# Patient Record
Sex: Female | Born: 1940 | Race: White | Hispanic: No | Marital: Married | Smoking: Never smoker
Health system: Southern US, Community
[De-identification: ages and names within clinical notes are randomized; demographics above are authoritative.]

## PROBLEM LIST (undated history)

## (undated) DIAGNOSIS — I10 Essential (primary) hypertension: Secondary | ICD-10-CM

## (undated) DIAGNOSIS — M1990 Unspecified osteoarthritis, unspecified site: Secondary | ICD-10-CM

## (undated) DIAGNOSIS — K219 Gastro-esophageal reflux disease without esophagitis: Secondary | ICD-10-CM

## (undated) DIAGNOSIS — N3010 Interstitial cystitis (chronic) without hematuria: Secondary | ICD-10-CM

## (undated) DIAGNOSIS — F0390 Unspecified dementia without behavioral disturbance: Secondary | ICD-10-CM

## (undated) DIAGNOSIS — T4145XA Adverse effect of unspecified anesthetic, initial encounter: Secondary | ICD-10-CM

## (undated) DIAGNOSIS — E7800 Pure hypercholesterolemia, unspecified: Secondary | ICD-10-CM

## (undated) DIAGNOSIS — H269 Unspecified cataract: Secondary | ICD-10-CM

## (undated) HISTORY — PX: CERVICAL DISC SURGERY: SHX588

---

## 1998-12-04 ENCOUNTER — Encounter: Payer: Self-pay | Admitting: Neurosurgery

## 1998-12-04 ENCOUNTER — Ambulatory Visit (HOSPITAL_COMMUNITY): Admission: RE | Admit: 1998-12-04 | Discharge: 1998-12-04 | Payer: Self-pay | Admitting: Neurosurgery

## 1998-12-21 ENCOUNTER — Ambulatory Visit (HOSPITAL_COMMUNITY): Admission: RE | Admit: 1998-12-21 | Discharge: 1998-12-21 | Payer: Self-pay | Admitting: Neurosurgery

## 1998-12-21 ENCOUNTER — Encounter: Payer: Self-pay | Admitting: Neurosurgery

## 1999-10-09 ENCOUNTER — Ambulatory Visit (HOSPITAL_COMMUNITY): Admission: RE | Admit: 1999-10-09 | Discharge: 1999-10-09 | Payer: Self-pay | Admitting: Neurosurgery

## 1999-10-09 ENCOUNTER — Encounter: Payer: Self-pay | Admitting: Neurosurgery

## 1999-11-19 ENCOUNTER — Encounter: Payer: Self-pay | Admitting: Neurosurgery

## 1999-11-19 ENCOUNTER — Ambulatory Visit (HOSPITAL_COMMUNITY): Admission: RE | Admit: 1999-11-19 | Discharge: 1999-11-19 | Payer: Self-pay | Admitting: Neurosurgery

## 1999-11-21 ENCOUNTER — Encounter: Payer: Self-pay | Admitting: Neurosurgery

## 1999-11-21 ENCOUNTER — Inpatient Hospital Stay (HOSPITAL_COMMUNITY): Admission: RE | Admit: 1999-11-21 | Discharge: 1999-11-24 | Payer: Self-pay | Admitting: Neurosurgery

## 1999-12-03 ENCOUNTER — Encounter: Admission: RE | Admit: 1999-12-03 | Discharge: 1999-12-03 | Payer: Self-pay | Admitting: Neurosurgery

## 1999-12-03 ENCOUNTER — Encounter: Payer: Self-pay | Admitting: Neurosurgery

## 2000-01-03 ENCOUNTER — Encounter: Payer: Self-pay | Admitting: Neurosurgery

## 2000-01-03 ENCOUNTER — Encounter: Admission: RE | Admit: 2000-01-03 | Discharge: 2000-01-03 | Payer: Self-pay | Admitting: Neurosurgery

## 2000-02-03 ENCOUNTER — Encounter: Admission: RE | Admit: 2000-02-03 | Discharge: 2000-02-03 | Payer: Self-pay | Admitting: Neurosurgery

## 2000-02-03 ENCOUNTER — Encounter: Payer: Self-pay | Admitting: Neurosurgery

## 2000-02-18 ENCOUNTER — Ambulatory Visit (HOSPITAL_COMMUNITY): Admission: RE | Admit: 2000-02-18 | Discharge: 2000-02-18 | Payer: Self-pay | Admitting: Neurosurgery

## 2000-02-18 ENCOUNTER — Encounter: Payer: Self-pay | Admitting: Neurosurgery

## 2000-07-21 ENCOUNTER — Ambulatory Visit (HOSPITAL_COMMUNITY): Admission: RE | Admit: 2000-07-21 | Discharge: 2000-07-21 | Payer: Self-pay | Admitting: Neurosurgery

## 2000-07-21 ENCOUNTER — Encounter: Payer: Self-pay | Admitting: Neurosurgery

## 2002-04-22 ENCOUNTER — Encounter: Payer: Self-pay | Admitting: Neurosurgery

## 2002-04-26 ENCOUNTER — Inpatient Hospital Stay (HOSPITAL_COMMUNITY): Admission: RE | Admit: 2002-04-26 | Discharge: 2002-05-01 | Payer: Self-pay | Admitting: Neurosurgery

## 2002-04-26 ENCOUNTER — Encounter: Payer: Self-pay | Admitting: Neurosurgery

## 2003-07-25 ENCOUNTER — Encounter: Payer: Self-pay | Admitting: Neurosurgery

## 2003-07-25 ENCOUNTER — Ambulatory Visit (HOSPITAL_COMMUNITY): Admission: RE | Admit: 2003-07-25 | Discharge: 2003-07-25 | Payer: Self-pay | Admitting: Neurosurgery

## 2003-08-29 ENCOUNTER — Encounter: Admission: RE | Admit: 2003-08-29 | Discharge: 2003-08-29 | Payer: Self-pay | Admitting: Neurosurgery

## 2003-08-29 ENCOUNTER — Encounter: Payer: Self-pay | Admitting: Neurosurgery

## 2003-09-13 ENCOUNTER — Encounter: Payer: Self-pay | Admitting: Neurosurgery

## 2003-09-13 ENCOUNTER — Encounter: Admission: RE | Admit: 2003-09-13 | Discharge: 2003-09-13 | Payer: Self-pay | Admitting: Neurosurgery

## 2003-11-03 ENCOUNTER — Ambulatory Visit (HOSPITAL_COMMUNITY): Admission: RE | Admit: 2003-11-03 | Discharge: 2003-11-03 | Payer: Self-pay | Admitting: Neurosurgery

## 2003-12-21 ENCOUNTER — Ambulatory Visit (HOSPITAL_BASED_OUTPATIENT_CLINIC_OR_DEPARTMENT_OTHER): Admission: RE | Admit: 2003-12-21 | Discharge: 2003-12-21 | Payer: Self-pay | Admitting: Orthopaedic Surgery

## 2003-12-21 ENCOUNTER — Ambulatory Visit (HOSPITAL_COMMUNITY): Admission: RE | Admit: 2003-12-21 | Discharge: 2003-12-21 | Payer: Self-pay | Admitting: Orthopaedic Surgery

## 2004-04-08 ENCOUNTER — Ambulatory Visit (HOSPITAL_COMMUNITY): Admission: RE | Admit: 2004-04-08 | Discharge: 2004-04-08 | Payer: Self-pay | Admitting: Neurosurgery

## 2004-05-21 ENCOUNTER — Inpatient Hospital Stay (HOSPITAL_COMMUNITY): Admission: RE | Admit: 2004-05-21 | Discharge: 2004-05-25 | Payer: Self-pay | Admitting: Neurosurgery

## 2006-01-03 ENCOUNTER — Ambulatory Visit (HOSPITAL_COMMUNITY): Admission: RE | Admit: 2006-01-03 | Discharge: 2006-01-03 | Payer: Self-pay | Admitting: Neurosurgery

## 2006-03-27 ENCOUNTER — Inpatient Hospital Stay (HOSPITAL_COMMUNITY): Admission: RE | Admit: 2006-03-27 | Discharge: 2006-03-28 | Payer: Self-pay | Admitting: Neurosurgery

## 2007-09-10 ENCOUNTER — Inpatient Hospital Stay (HOSPITAL_COMMUNITY): Admission: RE | Admit: 2007-09-10 | Discharge: 2007-09-13 | Payer: Self-pay | Admitting: Neurosurgery

## 2008-03-06 ENCOUNTER — Encounter: Admission: RE | Admit: 2008-03-06 | Discharge: 2008-03-06 | Payer: Self-pay | Admitting: Neurosurgery

## 2008-05-02 ENCOUNTER — Inpatient Hospital Stay (HOSPITAL_COMMUNITY): Admission: RE | Admit: 2008-05-02 | Discharge: 2008-05-08 | Payer: Self-pay | Admitting: Neurosurgery

## 2009-04-03 ENCOUNTER — Encounter: Admission: RE | Admit: 2009-04-03 | Discharge: 2009-04-03 | Payer: Self-pay | Admitting: Neurosurgery

## 2009-08-24 ENCOUNTER — Encounter: Admission: RE | Admit: 2009-08-24 | Discharge: 2009-08-24 | Payer: Self-pay | Admitting: Neurosurgery

## 2010-09-24 ENCOUNTER — Inpatient Hospital Stay (HOSPITAL_COMMUNITY): Admission: RE | Admit: 2010-09-24 | Discharge: 2010-09-27 | Payer: Self-pay | Admitting: Neurosurgery

## 2010-12-14 ENCOUNTER — Encounter: Payer: Self-pay | Admitting: Neurosurgery

## 2010-12-16 ENCOUNTER — Encounter: Payer: Self-pay | Admitting: Neurosurgery

## 2011-02-05 LAB — CBC
HCT: 40.8 % (ref 36.0–46.0)
Hemoglobin: 13.4 g/dL (ref 12.0–15.0)
MCH: 30.5 pg (ref 26.0–34.0)
MCHC: 32.8 g/dL (ref 30.0–36.0)
MCV: 92.9 fL (ref 78.0–100.0)
Platelets: 151 10*3/uL (ref 150–400)
RBC: 4.39 MIL/uL (ref 3.87–5.11)
RDW: 13.9 % (ref 11.5–15.5)
WBC: 8 10*3/uL (ref 4.0–10.5)

## 2011-02-05 LAB — COMPREHENSIVE METABOLIC PANEL
ALT: 22 U/L (ref 0–35)
AST: 19 U/L (ref 0–37)
Albumin: 3.2 g/dL — ABNORMAL LOW (ref 3.5–5.2)
Alkaline Phosphatase: 73 U/L (ref 39–117)
BUN: 18 mg/dL (ref 6–23)
CO2: 32 mEq/L (ref 19–32)
Calcium: 8.6 mg/dL (ref 8.4–10.5)
Chloride: 104 mEq/L (ref 96–112)
Creatinine, Ser: 0.77 mg/dL (ref 0.4–1.2)
GFR calc Af Amer: >60 mL/min (ref >60)
GFR calc non Af Amer: >60 mL/min (ref >60)
Glucose, Bld: 88 mg/dL (ref 70–99)
Potassium: 3.9 mEq/L (ref 3.5–5.1)
Sodium: 139 mEq/L (ref 135–145)
Total Bilirubin: 0.6 mg/dL (ref 0.3–1.2)
Total Protein: 5.7 g/dL — ABNORMAL LOW (ref 6.0–8.3)

## 2011-02-05 LAB — URINALYSIS, ROUTINE W REFLEX MICROSCOPIC
Ketones, ur: NEGATIVE mg/dL
Nitrite: NEGATIVE
Protein, ur: NEGATIVE mg/dL
Urobilinogen, UA: 0.2 mg/dL (ref 0.0–1.0)

## 2011-02-05 LAB — APTT: aPTT: 28 seconds (ref 24–37)

## 2011-02-05 LAB — DIFFERENTIAL
Basophils Absolute: 0 10*3/uL (ref 0.0–0.1)
Lymphocytes Relative: 31 % (ref 12–46)
Monocytes Absolute: 0.6 10*3/uL (ref 0.1–1.0)
Neutro Abs: 4.8 10*3/uL (ref 1.7–7.7)

## 2011-02-05 LAB — PROTIME-INR
INR: 0.98 (ref 0.00–1.49)
Prothrombin Time: 13.2 seconds (ref 11.6–15.2)

## 2011-04-08 NOTE — H&P (Signed)
NAMEJANILAH, [name]                ACCOUNT NO.:  [id number]   MEDICAL RECORD NO.:  [id number]          PATIENT TYPE:  INP   LOCATION:  3303                         FACILITY:  [facility]   PHYSICIAN:  Payton Doughty, M.D.      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   BODY OF TEXT:  A very nice 70 year old right-handed white girl, who 4  years ago underwent a fusion from L2-S1, did well, has developed  claudicatory complaints in her lower extremities.  Repeat CT shows tight  stenosis at L1-2 and adjacent segment disease from her fusion, and she  is admitted for an extension of her fusion up at T9.   Medical history is fairly benign.  She has had a bladder tack tubal  ligation, anterior and posterior cervical fusions.   Medications are pravastatin, omeprazole, Darvocet, Vicodin, doxycycline,  triamterene, oxybutynin, paroxetine atenolol, aspirin, and Centrum  Silver.   SURGICAL HISTORY:  Tubal ligation, benign ovarian tumors, arthroscopic  surgery in her knee, and cervical and lumbar fusions as noted above.   Family history is noncontributory.   REVIEW OF SYSTEMS:  Marked for back and neck pain, wearing glasses,  difficulty with urination.   ALLERGIES:  PERCOCET.   PHYSICAL EXAMINATION:  HEENT:  Exam is in normal limits.  She has  reasonable range of motion of the neck.  CHEST:  Clear.  CARDIAC:  Regular rate and rhythm.  ABDOMEN:  Nontender with no hepatosplenomegaly.  EXTREMITIES:  Without clubbing or cyanosis.  GU:  Peripheral pulses are good.  NEUROLOGICALLY:  She is awake, alert, and oriented.  Cranial nerves are  intact.  Motor exam shows 5/5 strength throughout the upper and lower  extremities.  She has no reflexes in her lower extremities.  She  complains that she does feel a claudication if she has been up and  about.   A CT scan has been  reviewed above.   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   PLAN:  Lumbar laminectomy at L1-2 and then extensor fusion pass  thoracolumbar junction up to T9.  The risks and benefits have been  discussed with her and she wished to proceed.   .           ______________________________  Payton Doughty, M.D.     MWR/MEDQ  D:  05/02/2008  T:  05/03/2008  Job:  [id number]

## 2011-04-08 NOTE — Op Note (Signed)
NAMEJAZZY, PARMER                ACCOUNT NO.:  [id number]   MEDICAL RECORD NO.:  [id number]          PATIENT TYPE:  INP   LOCATION:  3303                         FACILITY:  [facility]   PHYSICIAN:  Payton Doughty, M.D.      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   PROCEDURE:  T9-L2 segmental pedicle screws, posterolateral arthrodesis  with an L1-L2 laminotomy and foraminotomy.   SURGEON:  Payton Doughty, MD, Neurosurgery.   ANESTHESIA:  General endotracheal.   PREPARATION:  Prepped and draped with alcohol wipe.   COMPLICATIONS:  None.   NURSE ASSISTANT:  [place].   DOCTOR ASSISTANT:  Hewitt Shorts, MD   BODY OF TEXT:  This is a 70 year old lady with transitional segment  disease and claudicatory complaints, taken to operating room, smoothly  anesthetized, intubated, placed prone on the operating table.  Following  shave, prepped and draped in usual sterile fashion.  Skin was  infiltrated with 1% lidocaine with 1:1000 epinephrine and skin was  incised from mid T8 down to the top of L3.  The lamina of T9, T10, T11,  T12, L1, and the transverse processes of L2 with the screws between L2  and L3 as well as the rods were exposed.  The laminotomy and  foraminotomy were carried out bilaterally at L1, down over top of the 2  roots to allow generous decompression of both the central canal and  lateral recesses.  Pedicle screws were then placed in L1, T12, T11, T10,  and T9 on the right and L1, T11, T10, and T9 on the left.  The left T12  pedicle could not be entered because of the small size.  X-ray confirmed  good placement of screws.  They were connected by a rod.  The side  connectors were placed at L2-L3 and this allowed connection of the new  fusion to the old  fusion without removing the old rod.  Rod was  contoured and placed, locking caps applied, and the final tightener  applied.  The transverse processes and lamina of T9, T10, T11, T12, L1,  and L2 were decorticated with a high-speed drill and packed with BMP on  the extender matrix.  Final film showed  good placement of rods and screws.  Successive layers of 0-Vicryl, 2-0  Vicryl and 3-0 nylon were used to close.  Betadine-Telfa dressing was  applied, made occlusive with OpSite.  The patient returned to recovery  room in good condition.           ______________________________  Payton Doughty, M.D.     MWR/MEDQ  D:  05/02/2008  T:  05/03/2008  Job:  [id number]

## 2011-04-08 NOTE — Discharge Summary (Signed)
[name], [name]                ACCOUNT NO.:  [id number]   MEDICAL RECORD NO.:  [id number]          PATIENT TYPE:  INP   LOCATION:  3018                         FACILITY:  [facility]   PHYSICIAN:  Hewitt Shorts, M.D.DATE OF BIRTH:  1940-11-29   DATE OF ADMISSION:  09/10/2007  DATE OF DISCHARGE:  09/13/2007                               DISCHARGE SUMMARY   ADMISSION HISTORY AND PHYSICAL:  The patient is a 70 year old woman, a  patient of Dr. Trey Sailors.  She has had previous posterior cervical fusion  by Dr. Maeola Harman, a previous anterior cervical fusion by Dr. Channing Mutters.  She returns now for further treatment of cervical spondylosis by Dr. Channing Mutters  with extension of her posterior cervical fusion.  Further details of her  admission history and physical examination are included in Dr. Temple Pacini  admission note.   HOSPITAL COURSE:  The patient was admitted by Dr. Channing Mutters.  He performed a  posterior cervical surgery with a right C5-6 posterior cervical  laminotomy and foraminotomy and extension of her fusion from C5 to T1.  Details of the procedures are included his operative note.  Her  postoperative course went well.  She was able to progressively mobilize.  We were able to discontinue her Foley and PCA and she was transferred  from the intensive care unit to the neurosurgical floor.  She has been  voiding.  She has been up and ambulating and at this time, she is being  discharged to home with instructions on wound care and activities.  Her  wound is healing nicely.  The dressing was removed and it was left open  to air.  She is to return in one week for suture removal with Dr. Channing Mutters.  She was given a prescription for Vicodin 1 or 2 q.4-6 h. p.r.n. pain, 60  tablets, no refills.   DISCHARGE DIAGNOSIS:  Cervical spondylosis.      Hewitt Shorts, M.D.  Electronically Signed     RWN/MEDQ  D:  09/13/2007  T:  09/13/2007  Job:  [id number]

## 2011-04-08 NOTE — Discharge Summary (Signed)
NAMEJESSALYN, [name]                ACCOUNT NO.:  [id number]   MEDICAL RECORD NO.:  [id number]          PATIENT TYPE:  INP   LOCATION:  3004                         FACILITY:  [facility]   PHYSICIAN:  Payton Doughty, M.D.      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   PROCEDURES:  T9 to L2 segmental fusion.   COMPLICATIONS:  None.   DISCHARGE STATUS:  Alive and well.   BODY:  This is a 70 year old right-handed white lady whose history and  physical is recounted in the chart.  She had a fusion from L2-S1 and is  doing well.  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  She was  admitted after ascertaining normal laboratory values and underwent  fusion from T9 to L2.  Postoperatively, she has done well and spent a  couple of days in the ICU somewhat somnolent related to pain medication.  As this is cleared, she has been moved to the floor.  She is  participating in physical therapy.  She is up and about with her Foley  out, eating and voiding normally.  Her vital signs are stable, her  incisions are clean and dry and she is being discharged home in the care  of her family.  Her followup will be in the [place] offices in about a  week for sutures.           ______________________________  Payton Doughty, M.D.     MWR/MEDQ  D:  05/08/2008  T:  05/08/2008  Job:  [id number]

## 2011-04-08 NOTE — H&P (Signed)
NAMEJAYCELYNN, [name]                ACCOUNT NO.:  [id number]   MEDICAL RECORD NO.:  [id number]          PATIENT TYPE:  INP   LOCATION:  3018                         FACILITY:  [facility]   PHYSICIAN:  Payton Doughty, M.D.      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   This is a very nice now 70 year old, right-handed white lady who has had  a posterior decompression and fusion at C6-7, C7-T1 by Dr. Venetia Maxon  numerous years ago.  The she had an anterior decompression and fusion by  me in February 2007.  She had done well for awhile and has had  increasing pain out into her left arm.  She is now admitted for  posterior augmentation and fusion at C5, taking down the 6-7 and 7-1  fusion and replacing it.   PAST MEDICAL HISTORY:  Fairly benign.   ALLERGIES:  She has no allergies.   SOCIAL HISTORY:  She does not smoke or drink.   PAST SURGICAL HISTORY:  Lumbar spine operation.   PHYSICAL EXAMINATION:  HEENT:  Within normal limits.  NECK:  She has somewhat limited range of motion in her neck because of  neck pain.  CHEST:  Clear.  CARDIAC:  Regular rate and rhythm.  NEUROLOGIC:  She is awake, alert and oriented.  Cranial nerves are  intact. Motor exam shows 5/5 strength throughout the upper and lower  extremities.  There is no current sensory dysesthesias described in her  right C6 distribution.  Reflexes are flicker at the left biceps, 1 at  the right, 2 at the triceps bilaterally, 1 at the brachioradialis  bilaterally.  Hoffman's is negative.   MR results have been reviewed above.   CLINICAL IMPRESSION:  Right C6 radiculopathy with foraminal narrowing.   PLAN:  Revision and effusion posteriorly at 6-7 and 7-1 and extension up  to C5as well as a left C6 laminotomy.  The risks and benefits have been  discussed with her.  She wishes to proceed.    .     ______________________________  Payton Doughty, M.D.    MWR/MEDQ  D:  09/10/2007  T:  09/12/2007  Job:  [id number]

## 2011-04-08 NOTE — Op Note (Signed)
[name], [name]                ACCOUNT NO.:  [id number]   MEDICAL RECORD NO.:  [id number]          PATIENT TYPE:  INP   LOCATION:  2899                         FACILITY:  [facility]   PHYSICIAN:  Payton Doughty, M.D.      DATE OF BIRTH:  1941/09/15   DATE OF PROCEDURE:  09/10/2007  DATE OF DISCHARGE:                               OPERATIVE REPORT   PREOPERATIVE DIAGNOSIS:  Spondylosis below __________ including C3 and  C4.   POSTOPERATIVE DIAGNOSIS:  Spondylosis below __________ including C3 and  C4.   OPERATIVE PROCEDURE:  Left C5-6 laminotomy, foraminotomy and C5 to T1  posterior cervical fusion.   SERVICE:  Neurosurgery.   SURGEON:  Payton Doughty, M.D.   ANESTHESIA:  General endotracheal.   PREPARATION:  Prepped with alcohol wipe.   COMPLICATIONS:  None.   ASSISTANT:  Hewitt Shorts, M.D.   This is a 70 year old lady with a left C6 radiculopathy.  She had had a  prior posterior fusion from C6 to T1 and has a __________ anomaly above  that and a prior anterior fusion at C5-6. She was taken to the operating  room, smoothly anesthetized, intubated, placed prone in the Stryker bed.  Following shave, prep and drape in the usual sterile fashion, the old  skin incision was reopened and the C5 lamina was dissected free.  The  old hardware was exposed, it had screws at C6, C7 and T1 and the level  just above that was also exposed.  There was gross movement between the  two.  The top most screw on the left side which was in C6 was loose and  had migrated superiorly through the lateral mass and was within the  neural foramen.  The drill and Kerrison were used to follow this track  down and got right down to the nerve root.  The nerve root was therefore  decompressed with a foraminotomy.  The screw was placed above it and the  old screws were replaced on the right side and on the left side new  screws were placed in C6 in a different trajectory so as not to exit the  lateral mass.  The C5-T1 rods were then placed. The remaining lamina was  decorticated with a high-speed drill and packed with BMP on the putty  extender matrix.  Locking screws were tightened on the rods.  Intraoperative x-ray showed good placement of the screws in the top into  the construct.  Successive layers of #0 Vicryl, 2-0 Vicryl, and 3-0  nylon were used to close.  A Betadine Telfa dressing was applied and the  patient placed in an Aspen collar and returned to the recovery room in  good condition.           ______________________________  Payton Doughty, M.D.    MWR/MEDQ  D:  09/10/2007  T:  09/13/2007  Job:  [id number]

## 2011-04-11 NOTE — Op Note (Signed)
[place]. [facility]  Patient:    [name] Visit Number: [id number] MRN: [id number]          Service Type: SUR Location: 3000 [id number] [id number] Attending Physician:  Josie Saunders Proc. 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  OPERATION PERFORMED: 1. Axis lateral mass plating C6 through T1 with T1 pedicle screws with morselized    local bone autograft and laminectomy C6 through T1 levels with C6-7 foraminotomy    on the right utilizing Stealth neuronavigation system.  SURGEON:  Danae Orleans. Venetia Maxon, M.D.  ASSISTANT:  Tanya Nones. Jeral Fruit, M.D.  ANESTHESIA:  General endotracheal.  ESTIMATED BLOOD LOSS:  100 cc.  COMPLICATIONS:  None.  DISPOSITION:  Recovery.  INDICATIONS FOR PROCEDURE:  [name] is a 70 year old woman with severe cervical stenosis and degenerative disk disease with spondylolisthesis with C7 n T1 with cervical myelopathy and C7 radiculopathy on the right.  It was elected o take her to surgery for decompression and posterior fixation.  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  DESCRIPTION OF PROCEDURE:  The patient was brought to the operating room. Following the satisfactory and uncomplicated induction of general endotracheal anesthesia and placement of intravenous lines, the patient was placed in three-pin head fixation using the Mayfield head holder.  She was placed in the prone position turning her in the collar.  Her  shoulders were taped down.  Her occiput was shaved and her posterior neck was then prepped and draped in the usual sterile fashion. The area of planned incision was infiltrated with 0.25% Marcaine and 0.5% lidocaine 1:200,000 epinephrine.  Incision was made from what was felt to be the C5 through T1 levels.  This was carried through subcutaneous tissue in the avascular midline plane exposing lateral masses of C6 through T1.  The bone was cleared of soft tissue to facilitate utilization of Stealth system.  The case had already been loaded in the Stealth computer and the spine system was utilized.  The small clamp was placed on the spinous process of T1 and after inputting data appropriately, and utilizing surface merge protocol, the Stealth probe was then used to confirm entry point and trajectory of planned pedicle screws at the T1 level.  This was done using axis plates and standard landmarks of 3 mm lateral to the midpoint of the  articular facet at the level of the base of the transverse process angling the screw, angling the drill 10 degrees medial and 10 degrees caudal.  This was felt to reasonably accurate placement into the pedicles of T1 bilaterally and this was further refined using the Stealth system.  The axis plate 2.5 mm drill was then  used to drill pedicles of T1 bilaterally to a depth of 20 mm.  The outer cortex was tapped.  The C6 lateral masses were then drilled to a depth of 14 mm outer cortex was tapped.  13 mm plates were then cut to 3-hole configuration and lordosed appropriately and  following drilling down the facets at C7-T1 and at C6-7, the morselized autograft was then inserted into the facet joints bilaterally and tamped into position appropriately.  The lateral mass plates were then affixed to the posterior cervical spine with two 14 mm x 3.5 mm screws at C6 and two 18 mm x 3.5 mm screws at T1.  All screws had excellent purchase.  The right C7 lateral  mass screw was placed but due to the somewhat anomalous lateral mass at C7 on the left, it was elected not to place the lateral mass screw at that level.  Subsequently, a laminectomy was performed of C7 and the majority of T1 and the majority of C6 with decompression of the cervical spinal cord.  The C7 nerve root was widely decompressed on the right with a generous foraminotomy.  This was done using loupe magnification with Leksell rongeur and 2 mm gold tipped Kerrison rongeur.  The dura was felt to be well decompressed at the C7 level.  C7-T1 level there was significant cord compression which was relieved with laminectomy. Hemostasis was obtained with Gelfoam soaked in thrombin.  The wound was then copiously irrigated with bacitracin saline.  The lateral masses of C6 through T1 were decorticated bilaterally using Midas Rex drill with A-2 bur and remaining morselized bone autograft was then positioned over the lateral masses. Self-retaining retractors were removed.  The posterior cervical musculature was  reapproximated with 0 Vicryl sutures and the posterior cervical fascia was reapproximated with 0 Vicryl sutures.  The subcuticular layer was reapproximated with interrupted 2-0 Vicryl suture subcuticular stitch and the skin edges were reapproximated with 3-0 running simple nylon stitch.  The wound was dressed with bacitracin, Telfa, gauze and tape.  The patient was placed in an Aspen collar and taken out of pins, returned to supine position and extubated in the operating room and taken to recovery room in stable and satisfactory condition having tolerated the procedure well.  Prior to instrumenting the spine, an intraoperative x-ray as obtained which demonstrated a towel clip at the C5 level which was felt to correspond to the levels determined in surgery.  The final x-ray after positioning of hardware did not reveal any of the anatomic details, given the position  of the patients shoulders relative to the fixation of hardware. Attending Physician:  Josie Saunders DD:  11/21/99 TD:  11/22/99 Job: [id number]

## 2011-04-11 NOTE — H&P (Signed)
NAMEHARLEYQUINN, [name]                ACCOUNT NO.:  [id number]   MEDICAL RECORD NO.:  [id number]          PATIENT TYPE:  INP   LOCATION:  2899                         FACILITY:  [facility]   PHYSICIAN:  Payton Doughty, M.D.      DATE OF BIRTH:  1941-03-22   DATE OF ADMISSION:  03/27/2006  DATE OF DISCHARGE:                                HISTORY & PHYSICAL   ADMISSION DIAGNOSIS:  Spondylosis at C5-C6.   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 Venetia Maxon at C6-C7 posteriorly.  She has developed increasing neck and arm  pain and plain films demonstrate significant spondylosis at C5-C6 and she is  admitted now for an anterior decompression and fusion and C5-C6.   PAST MEDICAL HISTORY:  1.  Hypertension.  2.  Depression.   MEDICATIONS:  Toprol, Paroxetine, hydrochlorothiazide, triamterene,  oxybutynin.   ALLERGIES:  PERCOCET.   PAST SURGICAL HISTORY:  Several lumbar spine surgeries as well as those  outlined above in her neck.   SOCIAL HISTORY:  She does not smoke or drink and is retired.   FAMILY HISTORY:  Both parents are deceased.  History is not given.   REVIEW OF SYSTEMS:  Remarkable for arm and neck pain.   PHYSICAL EXAMINATION:  HEENT:  Within normal limits.  She has reasonable  range of motion in her neck, but it is painful.  CHEST:  Clear.  CARDIAC:  Regular rate and rhythm.  ABDOMEN:  Nontender with no hepatosplenomegaly.  EXTREMITIES:  Without clubbing, cyanosis or edema.  GENITALIA:  Deferred.  VASCULAR:  Peripheral pulses are good.  NEUROLOGIC:  She is awake, alert and oriented.  Cranial nerves 2-12 grossly  intact.  Motor exam shows 5/5 strength throughout the upper extremities  except for the biceps on the right that is 5-/5.  She has a right C6 sensory  deficit on the right and none on the left.  Reflexes are absent to biceps on  the right, 1 on the left, 1 at  triceps bilaterally, 1 at brachioradialis  bilaterally.  Hoffman's is negative.  Lower extremities are nonmyelopathic.   LABORATORY DATA AND X-RAY FINDINGS:  MR results have been reviewed above.   IMPRESSION:  Cervical spondylosis.   PLAN:  Anterior decompression and fusion at C5-C6.  The risks and benefits  of this approach have been discussed with her and she wishes to proceed.           ______________________________  Payton Doughty, M.D.     MWR/MEDQ  D:  03/27/2006  T:  03/27/2006  Job:  [id number]

## 2011-04-11 NOTE — Discharge Summary (Signed)
[place]. [facility]  Patient:    [name], [name] Visit Number: [id number] MRN: [id number]          Service Type: SUR Location: 3000 [id number] Attending Physician:  Emeterio Reeve Dictated by:   Payton Doughty, M.D. Admit Date:  04/26/2002 Discharge Date: 05/01/2002                             Discharge Summary  ADMITTING DIAGNOSIS:  Spondylosis L4-5, L5-S1.  DISCHARGE DIAGNOSIS:  Spondylosis L4-5, L5-S1.  PROCEDURES:  L4-5, L5-S1 laminectomy and diskectomy, posterior lumbar interbody fusion with a Ray Threaded Fusion Cage, posterolateral arthrodesis.  COMPLICATIONS:  None.  DISCHARGE STATUS:  Alive and well.  HISTORY OF PRESENT ILLNESS:  This is a 70 year old right-handed white lady whose history and physical is recounted on the chart.  She has a symptomatic spondylosis and spondylolisthesis at L4-5 with 6 mm of subluxation.  She was admitted for fusion.  PAST MEDICAL HISTORY:  Remarkable for hypertension and reflux.  MEDICATIONS:  Nexium, Zoloft, [name], Dyazide, and Voltaren.  ALLERGIES:  PERCOCET.  PHYSICAL EXAMINATION:  GENERAL:  General exam is unremarkable.  NEUROLOGIC:  Intact with nerve claudicatory complaints.  HOSPITAL COURSE:  She was admitted after ascertaining normal laboratory values and underwent an L4-5, L5-S1 laminectomy, diskectomy, and posterior lumbar interbody fusion.  Postoperatively she has done well.  She was on the floor, used the PCA for two days.  That was stopped, and she has mobilized in physical therapy since then.  She is currently competent with her activities of daily living, eating and voiding normally.  She is using Darvocet for pain. She is being discharged home in the care of her family [facility]. Dictated by:   Payton Doughty, M.D. Attending Physician:  Emeterio Reeve DD:  05/01/02 TD:  05/03/02 Job:  [id number]

## 2011-04-11 NOTE — Op Note (Signed)
NAME:  [name], [name]                          ACCOUNT NO.:  [id number]   MEDICAL RECORD NO.:  [id number]                   PATIENT TYPE:  AMB   LOCATION:  DSC                                  FACILITY:  [facility]   PHYSICIAN:  Claude Manges. Cleophas Dunker, M.D.            DATE OF BIRTH:  11/29/1940   DATE OF PROCEDURE:  12/21/2003  DATE OF DISCHARGE:                                 OPERATIVE REPORT   PREOPERATIVE DIAGNOSIS:  1. Rotator cuff tear with impingement, right shoulder.  2. Degenerative joint disease, acromioclavicular joint.   POSTOPERATIVE DIAGNOSIS:  1. Rotator cuff tear with impingement, right shoulder.  2. Degenerative joint disease, acromioclavicular joint.   PROCEDURE:  1. Arthroscopic subacromial decompression.  2. Arthroscopic distal clavicle resection.  3. Mini-open repair of rotator cuff tear.   SURGEON:  Claude Manges. Cleophas Dunker, M.D.   ANESTHESIA:  General orotracheal with supplemental interscalene block.   COMPLICATIONS:  None.   HISTORY:  70 year old female who has been experiencing trouble with her  right shoulder for several months.  She had a recent MRI scan performed in  December that revealed a full thickness tear of the supraspinatus tendon  with some retraction.  There was a partial tear of the infraspinatus tendon  and fairly significant tendinosis and tendonopathy of both the tendons.  There were some degenerative changes of the [facility] joint with narrowing and  marked encroachment on the supraspinatus from a lateral down sloping  acromion.  The patient has been uncomfortable and with the above diagnosis,  wishes to proceed with repair.   PROCEDURE:  With the patient comfortable on the operating table and under  general orotracheal anesthesia and with a supplemental interscalene nerve  block, the patient was placed in a semisitting position with a shoulder  frame.  The right shoulder was then prepped with DuraPrep from the base of  the neck circumferentially below  the elbow.  Sterile draping was performed.  A marking pen was used to outline the acromion, the [facility] joint, and the  coracoid.  At a point a fingerbreadth inferior and medial to the posterior  angle of the acromion, a small stab wound was made prior to which 0.25%  Marcaine with epinephrine was injected.  The arthroscope was easily placed  into the shoulder joint.  There was considerable partial tearing of the  rotator cuff at the humeral head.  The biceps tendon was intact.  I did not  see any significant chondromalacia of the humeral head or the acromion.  The  glenoid labrum was intact.  There were no loose bodies.  A second portal was  established anteriorly and I did shave the partial tearing.   The arthroscope was then placed in the subacromial space posteriorly and the  cannula placed in the subacromial space anteriorly.  A third portal was  established in the lateral subacromial space.  An arthroscopic subacromial  decompression was then performed.  There was considerable overhang of the  anterior and the lateral acromion.  The 6 mm bur was used to remove the  overhang.  I thought we had a very nice decompression.  The [facility] joint was  easily visualized and distal resection of about a cm was performed using the  6 mm bur through both the anterior and the lateral portals.  I had a very  nice distal clavicle resection.   A mini-open repair of the rotator cuff was then performed through about a 1  1/2 inch incision located at the junction of the anterior and lateral  aspects of the shoulder.  By sharp dissection, the incision was carried down  to the subcutaneous tissue.  An incision was made through the deltoid fascia  and the fibers were bluntly resected.  A retractor was inserted.  There was  still considerable bursal tissue in the recess anteriorly which was  resected.  There was an obvious rotator cuff tear of the supraspinatus with  some retraction in a V fashion.  I sharply  debrided the edges, had good  bleeding bone on the humeral head, inserted a single Mitek anchor and  repaired the cuff tear.  I then supplemented with one Ethibond suture to  bone.  As I placed the arm through a range of motion, there was no further  impingement.  I did a finger palpation of the subacromial space and felt I  had a very nice decompression.   The wound was irrigated with saline solution.  The deltoid fascia was closed  with a running 0 Vicryl, the subcu with 2-0 Vicryl, and the skin with  running 3-0 subcuticular Prolene with Steri-Strips over Benzoin.  0.25%  Marcaine with epinephrine was injected into the wound edges.  A sterile,  bulky dressing was applied followed by a sling.   PLAN:  Recovery care, discharge in the a.m., Jacky Kindle, office one  week.                                               Claude Manges. Cleophas Dunker, M.D.    PWW/MEDQ  D:  12/21/2003  T:  12/21/2003  Job:  [id number]

## 2011-04-11 NOTE — H&P (Signed)
NAME:  [name], [name]                          ACCOUNT NO.:  [id number]   MEDICAL RECORD NO.:  [id number]                   PATIENT TYPE:  INP   LOCATION:  NA                                   FACILITY:  [facility]   PHYSICIAN:  Payton Doughty, M.D.                   DATE OF BIRTH:  02-Jun-1941   DATE OF ADMISSION:  05/21/2004  DATE OF DISCHARGE:                                HISTORY & PHYSICAL   ADMITTING DIAGNOSIS:  Spondylosis L2-3, L3-4.   __________  This is a 70 year old right handed white lady who in June 2003 underwent a  decompression and fusion at L4-5 and L5-S1.  She did extremely well for  several years and then has had the increase in pain in her back and across  the knee and down the lateral aspect of the calf on the left.  She had a  foraminal disc at 3-4, which had resolved, but she has persistent pain.  Reimaging has revealed a synovial cyst at 3-4, a foraminal disc at 2-3 and  she is now admitted for a decompression and fusion of those two levels with  extension of her posterior hardware.   PAST MEDICAL HISTORY:  1. Hypertension.  2. Reflux.   MEDICATIONS:  She is on Nexium, [name], Diazide and Voltaren.   ALLERGIES:  She is intolerant to PERCOCET.   PAST SURGICAL HISTORY:  1. Tubal ligation and bladder tack in 1980.  2. Hysterectomy in 1986.  3. Bladder tack in 1987.  4. Ovarian tumor in 1990.  5. Arthroscopy of the knee in 1998.   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   REVIEW OF SYSTEMS:  Remarkable for back and leg pain.   HEENT:  Within normal limits.  NECK:  She has reasonable range of motion and a well healed scar.  CHEST:  Clear.  CARDIAC:  Regular rate and rhythm.  ABDOMEN:  Nontender with no hepatosplenomegaly.  EXTREMITIES:  Without clubbing or cyanosis.  Peripheral pulses are good.  GU:  Exam deferred.  NEUROLOGIC:  She is awake, alert and oriented.  Cranial nerves are intact.  Motor exam shows 5/5 strength throughout  the upper and lower extremities.  She has mild weakness in the extensors on the left side and may be pain  related.  She has a left L4 sensory deficit.  Reflexes are 2 at the knees,  absent at the ankles.  Straight leg raises bilaterally positive.   Radiographic results have been reviewed above.   CLINICAL IMPRESSION:  Lumbar spondylosis with progression and instability at  L2-3 and L3-4.   PLAN:  Lumbar laminectomy, discectomy, posterior lumbar __________ with  right sided fusion cages at L4-5 and L5-S1 and pedicle screws at L2 and L3  to be extended down to include the L3-4 and L4-5 fusion.  The risks and  benefits of this  approach were discussed with her and she wished to proceed.                                                Payton Doughty, M.D.    MWR/MEDQ  D:  05/21/2004  T:  05/21/2004  Job:  [id number]

## 2011-04-11 NOTE — H&P (Signed)
[place]. [facility]  Patient:    [name], [name] Visit Number: [id number] MRN: [id number]          Service Type: SUR Location: [id number] Attending Physician:  Emeterio Reeve Dictated by:   Payton Doughty, M.D. Admit Date:  04/26/2002                           History and Physical  ADMITTING DIAGNOSIS:  Spondylosis at L4-5, L5-S1.  SERVICE:  Neurosurgery.  HISTORY OF PRESENT ILLNESS:  This is a 70 year old right-handed white lady, who was a patient of Dr. Jomarie Longs D. Sterns, had an anterior cervical done in 2000 and did reasonably well.  She came to the office in March with increasing pain in her back and down her right leg, some pain in her arm but cramping in her calf and difficulty with her foot.  She had spondylolisthesis at 4-5, was restudied with flexion and extension views and found to move about 6 mm.  Her MRI showed significant stenosis and lateral recess narrowing at 4-5.  She is now admitted for a decompression and fusion and stabilization at 4-5/5-1.  PAST MEDICAL HISTORY:  Her medical history is remarkable for a bit of hypertension as well as reflux.  MEDICATIONS:  She is on Nexium, Zoloft, [name], Dyazide and Voltaren daily.  ALLERGIES:  She is intolerant to PERCOCET.  PAST SURGICAL HISTORY:  Tubal ligation, bladder tacking in 1980, hysterectomy in 1986, bladder tacking again in 1987, ovarian tumors in 1990, arthroscopy of knee in 1998.  SOCIAL HISTORY:  She does not smoke and does not drink.  She is a housewife.  REVIEW OF SYSTEMS:  Her review of systems is remarkable for back and leg pain.  PHYSICAL EXAMINATION:  HEENT:  Within normal limits.  NECK:  She has reasonable range of motion in her neck with a well-healed scar.  CHEST:  Clear.  CARDIAC:  Regular rate and rhythm.  ABDOMEN:  Abdomen is nontender with no hepatosplenomegaly.  EXTREMITIES:  Without clubbing or cyanosis.  GU:  Deferred.  PERIPHERAL PULSES:   Good.  NEUROLOGIC:  She is awake, alert and oriented.  Her cranial nerves are intact. Motor exam shows 5/5 strength throughout the upper and lower extremities. When she stands, she develops weakness in the dorsiflexors on the right side. Reflexes are 2 at the knees, absent at the ankles.  Straight leg raise is bilaterally positive.  LABORATORY AND ACCESSORY DATA:  MRI results have been reviewed above.  CLINICAL IMPRESSION:  Significant lumbar spondylosis with neurogenic claudication.  PLAN:  The plan is for a lumbar laminectomy, diskectomy and posterior lumbar interbody fusion at 4-5 and 5-1 with pedical screw stabilization.  The risks and benefits of this approach have been discussed with her and she wishes to proceed. Dictated by:   Payton Doughty, M.D. Attending Physician:  Emeterio Reeve DD:  04/26/02 TD:  04/27/02 Job: [id number]

## 2011-04-11 NOTE — H&P (Signed)
[place]. [facility]  Patient:    [name]                          MRN: [id number] Adm. 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  I initially saw her November 26, 1998.  At that time, she said it had not interfered with her work though was beginning to interfere with her ability to work.  She complained of pain in the back of her neck and into her right arm. he said this has developed over the last two years but has steadily increased over the past year.  She notes numbness and pain with the sense of electricity and as if  needles are sticking into her and notes pain and sharpness in all of her fingers on the right hand.  She describes it as a nagging pain rather than a sharp pain. he says she had left arm symptoms in 1987, was diagnosed with a left carpal tunnel  syndrome by was treated with medications and did not have surgery and had resolution of her symptoms.  She does note that her right arm is weak.  She has  noted that she cannot draw the number 5 because of lack of control of her hand.  She denies any lower extremity or any bowel or bladder dysfunction.  She has initially not had any x-rays obtained.  PAST MEDICAL HISTORY:  Significant for tubal ligation in 1975, bladder tack in 1980, hysterectomy in 1986, bladder tack again in 1987, benign ovarian tumors removed in 1990, and arthroscopic surgery of her right knee in October 1998. She has additional medical problems with high blood pressure and a hiatal hernia with acid reflux.  ALLERGIES:  No known  drug allergies other than an intolerance to PERCOCET.  SOCIAL HISTORY:  She is a nonsmoker, nondrinker.  She has noted weight gain from overeating and states that she is 5 feet 4 inches tall but would not state her weight.  CURRENT MEDICATIONS: 1. Triamterene/hydrochlorothiazide 37.5/25 one every morning. 2. Estradiol 1 mg every day. 3. Voltaren XR 100 mg daily. 4. Prilosec 20 mg daily. 5. Zoloft 50 mg daily. 6. [name] 20 mEq daily.  REVIEW OF SYSTEMS:  Detailed Review of Systems sheet was reviewed with the patient. She is negative constitutional, gastrointestinal, genitourinary, integumentary,  neurologic, endocrine, hematologic, and allergic.  Review of Systems under eyes, she notes that she wears glasses and has cataracts.  Ear, Nose, and Throat: She has hearing aid, hearing loss, sinus problems, and sinus headache.  Cardiovascular: She notes high blood pressure and high cholesterol.  Respiratory: Chronic cough. Musculoskeletal: She notes arm weakness, back pain, arm pain, arthritis, and neck pain.  Psychiatric: She notes trouble with anxiety and depression.  DIAGNOSTIC STUDIES:  Initially she had no diagnostic studies of her cervical spine, but there were subsequently obtained.  PHYSICAL EXAMINATION:  GENERAL:  A very pleasant, cooperative, moderately obese white female in no acute distress.  MUSCULOSKELETAL:  She had right paraspinous  spasms in the paracervical region, negative on the left.  She has reproducible right arm pain, radicular symptoms turning her head to the right and extending her neck.  She has negative Lhermittes sign with axial compression, negative Spurlings maneuver on the left.  She has negative shoulder impingement testing.  NEUROLOGIC:  She is awake, alert, and fully oriented.  Pupils are equal, round nd reactive to light.  Extraocular movements are intact.  Funduscopic examination reveals sharp disks OU.  She wears a hearing aid in the left  ear and is hard of  hearing in both ears.  She said her right ear has not been as bad as her left but is now getting worse.  Facial sensation and facial motor intact and symmetric.  Shoulder shrug is symmetric.  Palate is upgoing.  Tongue is midline and mobile.  Upper extremity strength is full in all motor groups bilaterally symmetric with the exception of the right biceps strength which is 4/5.  Lower extremity strength s full in all motor groups bilaterally symmetric.  Reflexes are trace in the right biceps, 2 in the left biceps, 2 in the triceps bilaterally, 2 at the brachial radialis.  Negative Hoffmanns sign.  Knee jerks are 2, ankle jerks are 2. Great toes are downgoing to plantar stimulation.  She notes numbness in the right hand in the first through fourth digits to pinprick.  There is not any evidence of numbness in the left upper extremity or in her lower extremities.  HEENT:  No carotid bruits, no masses.  CHEST:  Clear to auscultation.  HEART:  Regular rate and rhythm without murmur.  ABDOMEN:  Soft, nontender.  Active bowel sounds.  No hepatosplenomegaly appreciated.  EXTREMITIES:  No edema, clubbing, or cyanosis in the lower extremities.  Intact  pedal pulses.  INITIAL IMPRESSION:  [name] is a 70 year old woman with reproducible right upper extremity pain with a history of cervical spondylosis and some right biceps weakness.  The patient then had an MRI and cervical spine x-rays obtained.  She  returned to see me on the 19th of January.  Her MRI and plain x-rays were reviewed with her.  These show subluxation of C7 on T1 which appears to be stable on flexion, and extension is approximately 5 mm.  There is some posterior buckling of the ligament at C7-T1 with narrowing of the subarachnoid space but without frank cord compression.  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 She has a left C5-6 osteophyte formation and right C6-7 osteophyte formation. he did not have any spinal cord compression on her initial studies.  Her strength s  full on confrontational testing, but she continued to complain of significant right arm pain.  I recommended that, since she was not doing too badly at that point,  that she continue with conservative management and not undergo any surgical intervention given the extent of her disease.  She return to see me on 31 May, nd repeat x-rays showed no progression of subluxation at C7-T1.  She said at that point that she was doing better, and the plan was for her to return in a year with repeat cervical spine flexion/extension views; however, on November 6, she returned.  She said she was having a lot more difficulty with right arm pain and numbness along with right leg pain and numbness and stiffness in both her legs.  She also said that  her neck was bothering her a lot more.  She has been working but says it has been hard for her to do so.  On examination at that point, she had mild intrinsic weakness on the right, negative on the left.  She appeared to have full extremity strength in all motor groups bilaterally symmetric. She lso noted some low back pain to palpation at the lumbosacral junction.  Her lower extremity strength was full in motor groups bilaterally symmetric.  Reflexes were somewhat more brisk in the upper extremities and clearly more brisk than they were in the lower extremities, 3 at the knees, and 3 at the ankles.  Great toes were  downgoing to plantar stimulation.  She did appear to have increased tone in her  lower extremities compared to her last visit, and she feels she has more stiffness. She has not noted a lot of change in her bowel or bladder control.  She notes Lhermittes sign with axilla compression.  She has some numbness in her right greater than left upper  extremities and lower extremities to pin.  She has negative straight leg raise.  She did not have any sciatic notch discomfort to palpation.  I was concerned that, given Ms. Millers history of subluxation of C7 on T1 and significant worsening of her pain complaints along with her subjective stiffness and objective findings of hyperreflexia, that she may have increased cord compression.  I then sent her for repeat MRI and cervical spine x-rays.  She returned to see me on November 17, and the cervical MRI and cervical spine flexion and extension views were reviewed.  This demonstrated significant foraminal stenosis at C6-7 on the right and also stable subluxation of C7 on T1, and plain x-rays of the flexion/extension views did not image her cervicothoracic junction as well as the prior imaging series.  Her MRI read by the radiologist is normal. t shows subluxation of C7 on T1 with posterior ligamentous compression of the spinal cord, relative stenosis at this level, and also increased cord signal at the C7-T1 level.  Additionally, a Klippel-Feil anomaly at C3-4 with congenital effusion and degenerative disk disease at C4-5 and C5-6 levels was demonstrated.  The C5-6 level demonstrates some neural foraminal stenosis on the left and no significant foraminal stenosis at the C4-5 level.  I recommended to Ms. Hyacinth Meeker, based on her persistent and significant pain, that she undergo surgical decompression.  I think there is evidence of ventral compression at C6-7 due to uncinate spur and herniated disk.  There is also a significant amount of dorsal compression at C7-T1 with hypertropy ligamentum flavum causing  relative stenosis.  I, therefore, recommended that she undergo a posterior foraminotomy at C6-7 to decompress the C7 nerve root and posterior laminectomy nd decompression of the spinal canal and spinal cord at the C7-T1 levels with posterior fusion with access plates from C6  through T1 levels.  She wanted to go ahead with the surgery.  She understands the potential risks which include weakness, paralysis, damage to nerves and vessels, need for further surgery, further degenerative changes at the C4-5 and C6-7 levels, potential for malpositioning of hardware, failure of hardware.  She understands these risks and wishes to proceed with surgery. DD:  11/21/07 TD:  11/21/99 Job: [id number]

## 2011-04-11 NOTE — Op Note (Signed)
[name], [name]                ACCOUNT NO.:  [id number]   MEDICAL RECORD NO.:  [id number]          PATIENT TYPE:  INP   LOCATION:  3003                         FACILITY:  [facility]   PHYSICIAN:  Payton Doughty, M.D.      DATE OF BIRTH:  07-Apr-1941   DATE OF PROCEDURE:  03/27/2006  DATE OF DISCHARGE:                                 OPERATIVE REPORT   PREOPERATIVE DIAGNOSES:  Spondylosis C5-6.   POSTOPERATIVE DIAGNOSES:  Spondylosis C5-6.   OPERATION:  C5-6 anterior cervical decompression and fusion with __________.   SURGEON:  Payton Doughty, M.D.   SERVICE:  Neurosurgery.   ANESTHESIA:  General endotracheal.   PREPARATION:  Prepped and draped with alcohol wipe.   COMPLICATIONS:  None.   ASSISTANT:  Coletta Memos, M.D.   DESCRIPTION OF PROCEDURE:  A 70 year old right-hand white lady with severe  cervical spondylosis at C5-6 taken to the operating room, anestatize,  intubated, placed supine on the operating table in the halter head traction.  She was prepped and draped in the usual sterile fashion. The skin was  incised in the midline, the medial border of the sternocleidomastoid muscle  on the left side of the platysma was identified, elevated, divided and  undermined. The sternocleidomastoid was identified via dissection revealing  the carotid artery __________  left trachea and esophagus retracted  laterally to the right exposing the bones of the anterior cervical spine. A  mark was placed, intraoperative x-ray obtained to confirm correct level.  Having confirmed correct level, diskectomy was carried out at C5-6 under  gross observation. The operating microscope was then brought in, we used  microdissection technique to remove the remaining osteophyte, explore the  neuroforamina bilaterally and divide the posterior longitudinal ligament.  There was a posteriorly protruding osteophyte off of both C5 and C6,  instruments removed without difficulty. Neuroforamina was decompressed  and  found to be open. The 7 mm bone graft was fashioned with patella allograft  and tapped into place. A 14 mm reflex __________  was then placed with 12 mm  screws, two in C5, and 2 in C6. Intraoperative x-rays show good placement of  bone graft, plate and screws. The wound was irrigated,  hemostasis assured, platysma and subcutaneous tissues reapproximated with 3-  0 Vicryl in interrupted fashion. The skin was closed with 4-0 Vicryl in  running subcuticular fashion. Benzoin and Steri-Strips were placed and they  included Telfa and OpSite and the patient returned to the recovery room in  good condition.           ______________________________  Payton Doughty, M.D.     MWR/MEDQ  D:  03/27/2006  T:  03/27/2006  Job:  [id number]

## 2011-04-11 NOTE — Discharge Summary (Signed)
NAME:  [name], [name]                          ACCOUNT NO.:  [id number]   MEDICAL RECORD NO.:  [id number]                   PATIENT TYPE:  INP   LOCATION:  3007                                 FACILITY:  [facility]   PHYSICIAN:  Hilda Lias, M.D.                DATE OF BIRTH:  [birth year]   DATE OF ADMISSION:  05/21/2004  DATE OF DISCHARGE:  05/25/2004                                 DISCHARGE SUMMARY   ADMISSION DIAGNOSIS:  Spondylosis L2-3, L3-4.   DISCHARGE DIAGNOSIS:  Spondylosis L2-3, L3-4.   HISTORY OF PRESENT ILLNESS:  The patient was admitted because of back pain  with radiation to both legs.  The patient has had previous surgery by Dr.  Channing Mutters.   LABORATORY DATA:  Normal.   HOSPITAL COURSE:  The patient was taken to the surgery and a L2-3, L3-4  laminectomy, diskectomy, interbody fusion as well as pedicle screws from L2-  S1 was done at surgery.  The patient is doing really well.  She is  ambulating, and she is ready to go home today.  She feels that she has  minimal pain.  The patient was seen by Dr. Channing Mutters yesterday and he talked to  her about restrictions.   DISCHARGE CONDITION:  Improving.   DISCHARGE MEDICATIONS:  1. Percocet.  2. Vicodin.  3. Flexeril.   DIET:  Regular.   ACTIVITY:  She is not to drive.   FOLLOWUP:  She is to call the office to be followed by Dr. Channing Mutters.                                                Hilda Lias, M.D.    EB/MEDQ  D:  05/25/2004  T:  05/26/2004  Job:  [id number]

## 2011-04-11 NOTE — Op Note (Signed)
[facility]  Patient:    [name], [name] Visit Number: [id number] MRN: [id number]          Service Type: SUR Location: 3000 [id number] Attending Physician:  Emeterio Reeve Dictated by:   Payton Doughty, M.D. Proc. Date: 04/26/02 Admit Date:  04/26/2002                             Operative Report  PREOPERATIVE DIAGNOSIS:  Spondylosis L4-5 and L5-S1.  POSTOPERATIVE DIAGNOSIS:  Spondylosis L4-5 and L5-S1.  OPERATION PERFORMED:  L4-5 and L5-S1 laminectomy and diskectomy, posterior lumbar interbody fusion with Ray threaded fusion cage with bilateral lateral arthrodesis at L4-5 and L5-S1 with 90D pedicle screws.  SURGEON:  Payton Doughty, M.D.  ANESTHESIA:  General endotracheal.  PREP:  Sterile Betadine prep and scrub with alcohol wipe.  COMPLICATIONS:  CSF leak.  ASSISTANT: 1. Kyle L. Franky Macho, M.D. 2. [place].  DESCRIPTION OF PROCEDURE:  The patient is a 70 year old lady with severe lumbar spondylosis at L4-5 and L5-S1.  There is a grade 1 slip at L4-5.  The patient was taken to the operating room, smoothly anesthetized and intubated, and placed prone on the operating table.  Following shave, prep and drape in the usual sterile fashion.  The skin was infiltrated with 1% lidocaine 1:400,000 epinephrine.  The skin was incised from mid-L3 to mid-S1 and the lamina of L3, L4, L5 and S1 were exposed bilaterally in the subperiosteal plane out over the facet joints.  3-4, 4-5 and 5-1 facet joints were exposed as were the transverse processes of L4, L5 and the sacral ala.  Intraoperative x-ray confirmed correctness of the level.  The pars interarticularis, lamina and inferior facet of L4, L5 and superior facet of L5 and S1 were removed bilaterally.  The ligamentum flavum was also removed.  On the right side, during removal of ligamentum flavum, there was some that was densely adherent to the dura and its removal caused a small hole in the dura.  This  was dissected free and closed primarily with 6-0 Nurolon, tested with Valsalva and found to be tightly shut.  Following closure of this, diskectomy was carried out at 4-5 and 5-1.  Ray threaded fusion cages were then placed, 14 x 21 at both 4-5 and 5-1.  Intraoperative x-ray confirmed good placement of cages. They were packed with bone graft harvested from facet joints and capped. Pedicle screws were then placed in L4, L5 and S1 using the standard landmarks. Pedicle screws were 6.25 x 40 mm.  Intraoperative x-rays confirmed good placement of pedicle screws.  They were linked by linked rods and capped.  The wound was irrigated, hemostasis assured.  The transverse processes of L4 and L5 were denuded with a high speed drill and the remaining bone packed onto them as an intertransverse fusion.  The fascia was then reapproximated with 0 Vicryl in interrupted fashion.  Subcutaneous tissues were reapproximated with 0 Vicryl in interrupted fashion.  Subcuticular tissues were reapproximated with 3-0 Vicryl in interrupted fashion.  The skin was closed with 3-0 nylon in running locked fashion.  Bacitracin and Telfa dressing was then applied and made occlusive with Op-Site.  The patient then returned to the recovery room in good condition. Dictated by:   Payton Doughty, M.D. Attending Physician:  Emeterio Reeve DD:  04/26/02 TD:  04/27/02 Job: [id number]

## 2011-04-11 NOTE — Op Note (Signed)
NAME:  [name], [name]                          ACCOUNT NO.:  [id number]   MEDICAL RECORD NO.:  [id number]                   PATIENT TYPE:  INP   LOCATION:  3007                                 FACILITY:  [facility]   PHYSICIAN:  Payton Doughty, M.D.                   DATE OF BIRTH:  Apr 14, 1941   DATE OF PROCEDURE:  05/21/2004  DATE OF DISCHARGE:                                 OPERATIVE REPORT   PREOPERATIVE DIAGNOSIS:  Spondylosis L2-3 and L3-4.   POSTOPERATIVE DIAGNOSIS:  Spondylosis L2-3 and L3-4.   PROCEDURE:  L2-3 and L3-4 laminectomy, diskectomy, and posterior lumbar  interbody fusions with Ray threaded fusion cages.  Segmental pedicle  fixation from L2 through S1 and post lateral arthrodesis from L2 to L4.   SURGEON:  Payton Doughty, M.D.   ANESTHESIA:  General endotracheal.  Prepped with Betadine and scrubbed with  alcohol wipe.   ASSISTANT:  Nurse assistant is [place].  Doctor assistant is Hilda Lias, M.D.   INDICATIONS FOR PROCEDURE:  A 70 year old right-handed white lady who has  previously been fused at L4-5 and L5-S1.   DESCRIPTION OF PROCEDURE:  She was taken to the operating room smoothly  anesthetized and intubated and placed prone on the operating table.  Back  was shaved and prepped and draped in the usual sterile fashion.  The skin  was infiltrated with 1% lidocaine with 1:400,000 epinephrine.  The skin was  incised from the middle of L1 to the middle of S1.  The old hardware at L4,  L5, and S1 as well as the lamina and transverse processes of L2 and L3 were  exposed bilaterally in the subperiosteal plane.  Intraoperative x-ray  confirmed the correctness of the level and after confirming the correctness  of the level, the pars interarticularis lamina and inferior facet of L2 and  L3, superior facet of L3 and L4 were removed bilaterally.  The bone was set  aside for grafting.  The ligamentum flavum was removed.  Both levels were  quite degenerated with obvious  laxity of the facet joints.  Decompression  was effected of the L2, L3, and L4 roots as they traversed these areas.  Diskectomy was carried out at each level.  Followed by 21 mm Ray threaded  fusion cages were placed at L3-4 and L2-3.  Pedicle screws were then placed  at L2 and L3.  The old rod attached to L3, L4, and S1 was removed.  New rods  were contoured and placed to include L2, L3, L4, L5, and S1.  Following  decompression and following placement of the rods, caps were placed.  The  transverse processes were decorticated.  The Ray cages were packed with bone  harvested from facet joints and the intertransverse bone was packed with DBX  and autologous croutons.  The entire area was irrigated and hemostasis  assured.  Intraoperative x-ray  showed good placement of Ray cages, screws,  and rods.  The fascia was reapproximated with 0 Vicryl in interrupted  fashion, the subcutaneous tissue was reapproximated with 0 Vicryl in  interrupted fashion, subcuticular tissue was reapproximated with 3-0 Vicryl  in interrupted fashion.  The skin was closed with 3-0 nylon in a running  locking fashion.  Betadine and Telfa dressing was applied and made occlusive  with Op-Site.  The patient returned to the recovery room in good condition.                                               Payton Doughty, M.D.   MWR/MEDQ  D:  05/21/2004  T:  05/21/2004  Job:  [id number]

## 2011-08-21 LAB — CBC
MCHC: 33.8
MCV: 91.4
Platelets: 214

## 2011-08-21 LAB — APTT: aPTT: 29

## 2011-08-21 LAB — COMPREHENSIVE METABOLIC PANEL
AST: 23
Albumin: 3.8
BUN: 16
Calcium: 9.4
Creatinine, Ser: 0.74
GFR calc Af Amer: >60
GFR calc non Af Amer: >60
Sodium: 133 — ABNORMAL LOW

## 2011-08-21 LAB — PROTIME-INR: Prothrombin Time: 13.1

## 2011-08-21 LAB — DIFFERENTIAL
Eosinophils Relative: 0
Lymphocytes Relative: 25
Lymphs Abs: 2.7
Monocytes Absolute: 0.9
Neutro Abs: 7

## 2011-08-21 LAB — URINALYSIS, ROUTINE W REFLEX MICROSCOPIC
Glucose, UA: NEGATIVE
Ketones, ur: NEGATIVE
Nitrite: NEGATIVE
Protein, ur: NEGATIVE

## 2011-08-21 LAB — TYPE AND SCREEN: ABO/RH(D): A POS

## 2011-09-04 LAB — BASIC METABOLIC PANEL
BUN: 18
CO2: 29
Calcium: 9
Chloride: 97
Creatinine, Ser: 0.86
GFR calc Af Amer: >60
Glucose, Bld: 97

## 2011-09-04 LAB — CBC
MCHC: 34.1
MCV: 92.8
Platelets: 224
RDW: 13.9

## 2011-09-04 LAB — TYPE AND SCREEN
ABO/RH(D): A POS
Antibody Screen: NEGATIVE

## 2011-09-18 ENCOUNTER — Encounter (HOSPITAL_COMMUNITY)
Admission: RE | Admit: 2011-09-18 | Discharge: 2011-09-18 | Disposition: A | Discharge status: Home / Self Care | Payer: Medicare Other | Source: Ambulatory Visit | Attending: Orthopedic Surgery | Admitting: Orthopedic Surgery

## 2011-09-18 LAB — BASIC METABOLIC PANEL
CO2: 29 mEq/L (ref 19–32)
Calcium: 9.6 mg/dL (ref 8.4–10.5)
Chloride: 101 mEq/L (ref 96–112)
Creatinine, Ser: 0.57 mg/dL (ref 0.50–1.10)
Glucose, Bld: 56 mg/dL — ABNORMAL LOW (ref 70–99)

## 2011-09-18 LAB — SURGICAL PCR SCREEN
MRSA, PCR: NEGATIVE
Staphylococcus aureus: NEGATIVE

## 2011-09-18 LAB — CBC
HCT: 40.8 % (ref 36.0–46.0)
MCH: 32.7 pg (ref 26.0–34.0)
MCV: 96.7 fL (ref 78.0–100.0)
Platelets: 193 10*3/uL (ref 150–400)
RDW: 13.8 % (ref 11.5–15.5)

## 2011-09-18 LAB — TYPE AND SCREEN

## 2011-09-23 ENCOUNTER — Inpatient Hospital Stay (HOSPITAL_COMMUNITY)
Admission: RE | Admit: 2011-09-23 | Discharge: 2011-09-26 | DRG: 484 | Disposition: A | Discharge status: Home Health | Payer: Medicare Other | Source: Ambulatory Visit | Attending: Orthopedic Surgery | Admitting: Orthopedic Surgery

## 2011-09-23 ENCOUNTER — Other Ambulatory Visit: Payer: Self-pay | Admitting: Orthopedic Surgery

## 2011-09-23 ENCOUNTER — Inpatient Hospital Stay (HOSPITAL_COMMUNITY): Payer: Medicare Other

## 2011-09-23 DIAGNOSIS — I10 Essential (primary) hypertension: Secondary | ICD-10-CM | POA: Diagnosis present

## 2011-09-23 DIAGNOSIS — F411 Generalized anxiety disorder: Secondary | ICD-10-CM | POA: Diagnosis present

## 2011-09-23 DIAGNOSIS — R339 Retention of urine, unspecified: Secondary | ICD-10-CM | POA: Diagnosis not present

## 2011-09-23 DIAGNOSIS — Z01812 Encounter for preprocedural laboratory examination: Secondary | ICD-10-CM

## 2011-09-23 DIAGNOSIS — IMO0001 Reserved for inherently not codable concepts without codable children: Secondary | ICD-10-CM | POA: Diagnosis present

## 2011-09-23 DIAGNOSIS — G309 Alzheimer's disease, unspecified: Secondary | ICD-10-CM | POA: Diagnosis present

## 2011-09-23 DIAGNOSIS — R5381 Other malaise: Secondary | ICD-10-CM | POA: Diagnosis present

## 2011-09-23 DIAGNOSIS — F0280 Dementia in other diseases classified elsewhere without behavioral disturbance: Secondary | ICD-10-CM | POA: Diagnosis present

## 2011-09-23 DIAGNOSIS — Z79899 Other long term (current) drug therapy: Secondary | ICD-10-CM

## 2011-09-23 DIAGNOSIS — M069 Rheumatoid arthritis, unspecified: Secondary | ICD-10-CM | POA: Diagnosis present

## 2011-09-23 DIAGNOSIS — M19019 Primary osteoarthritis, unspecified shoulder: Principal | ICD-10-CM | POA: Diagnosis present

## 2011-09-23 LAB — GLUCOSE, CAPILLARY

## 2011-09-24 LAB — CBC
Hemoglobin: 10.1 g/dL — ABNORMAL LOW (ref 12.0–15.0)
Platelets: 125 10*3/uL — ABNORMAL LOW (ref 150–400)
RBC: 3.22 MIL/uL — ABNORMAL LOW (ref 3.87–5.11)
WBC: 7.5 10*3/uL (ref 4.0–10.5)

## 2011-09-24 LAB — BASIC METABOLIC PANEL
Calcium: 8.3 mg/dL — ABNORMAL LOW (ref 8.4–10.5)
GFR calc Af Amer: >90 mL/min (ref >90)
GFR calc non Af Amer: 85 mL/min — ABNORMAL LOW (ref >90)
Glucose, Bld: 117 mg/dL — ABNORMAL HIGH (ref 70–99)
Sodium: 134 mEq/L — ABNORMAL LOW (ref 135–145)

## 2011-09-25 LAB — BASIC METABOLIC PANEL
BUN: 7 mg/dL (ref 6–23)
CO2: 36 mEq/L — ABNORMAL HIGH (ref 19–32)
Chloride: 92 mEq/L — ABNORMAL LOW (ref 96–112)
Creatinine, Ser: 0.59 mg/dL (ref 0.50–1.10)
GFR calc Af Amer: >90 mL/min (ref >90)

## 2011-09-25 LAB — CBC
HCT: 32 % — ABNORMAL LOW (ref 36.0–46.0)
MCV: 97 fL (ref 78.0–100.0)
RDW: 13.4 % (ref 11.5–15.5)
WBC: 8.7 10*3/uL (ref 4.0–10.5)

## 2011-09-26 LAB — BODY FLUID CULTURE
Culture: NO GROWTH
Culture: NO GROWTH

## 2011-09-27 NOTE — Discharge Summary (Signed)
  [name], [name] NO.:  [id number]  MEDICAL RECORD NO.:  [id number]  LOCATION:  5015                         FACILITY:  [facility]  PHYSICIAN:  Jones Broom, MD    DATE OF BIRTH:  05-04-1941  DATE OF ADMISSION:  09/23/2011 DATE OF DISCHARGE:  09/26/2011                              DISCHARGE SUMMARY   FINAL DIAGNOSES: 1. Left shoulder degenerative joint disease, status post left total     shoulder replacement. 2. Hypertension. 3. Anxiety.  PRINCIPAL PROCEDURE:  Left total shoulder replacement on September 23, 2011.  HOSPITAL COURSE:  [name] was admitted after undergoing left total shoulder arthroplasty on September 23, 2011, without complication.  She recovered well on the floor postoperatively.  Pain was initially controlled with PCA and subsequently transitioned to oral analgesics. She had a nerve block preoperatively, which helped with postoperative pain as well.  She required a Foley catheter placement on postoperative day #1 for urinary retention and the catheter was removed postoperative day #2.  Given her advanced age, she moves fairly slowly with therapy and mobilization postoperatively and therefore was kept an additional day for mobilization.  Postoperative day number #1, her drain was discontinued.  Postoperative day #2, her dressing was changed, and her incision was clean, dry, and intact.  She worked with occupational therapy who felt she will be a good candidate for home health therapy. She was following restrictions appropriately at the time of discharge. During her hospital stay, her first night, there was some concern of acute exacerbation of chronic mild dementia.  This cleared very quickly. Postoperative day #3, she was in good spirits and pain was well controlled.  She was discharged home in stable condition with her husband with home health occupational therapy.  She was maintained on vancomycin throughout her hospital stay given  the very low concern of possibility of underlying infection at the time of surgery.  All cultures remained negative, and therefore at the time of discharge, her antibiotics were stopped.  DISCHARGE INSTRUCTIONS:  She will continue with occupational therapy with passive range of motion only with gentle stretching to a goal of 30 degrees external rotation and 130 degrees forward flexion.  She can have a dry dressing as needed only.  She will have Percocet for pain control. She will follow up with me in my office in 10-14 days or sooner with any problems or concerns.     Jones Broom, MD     JC/MEDQ  D:  09/26/2011  T:  09/26/2011  Job:  [id number]  Electronically Signed by Jones Broom  on 09/27/2011 01:48:46 PM

## 2011-09-27 NOTE — Op Note (Signed)
[name], [name] NO.:  [id number]  MEDICAL RECORD NO.:  [id number]  LOCATION:  2899                         FACILITY:  [facility]  PHYSICIAN:  Jones Broom, MD    DATE OF BIRTH:  February 21, 1941  DATE OF PROCEDURE:  09/23/2011 DATE OF DISCHARGE:                              OPERATIVE REPORT   PREOPERATIVE DIAGNOSIS:  Left shoulder degenerative joint disease.  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  PROCEDURE PERFORMED:  Left total shoulder replacement.  ATTENDING SURGEON:  Jones Broom, MD  ASSISTANT:  Skip Mayer, PA-C.  COMPLICATIONS:  None.  DRAINS:  One medium Hemovac.  SPECIMENS:  Synovial hypertrophy was sent to evaluate for rheumatoid pannus and multiple cultures were sent given her previous surgical history.  ESTIMATED BLOOD LOSS:  200 mL.  INDICATION FOR SURGERY:  The patient is a 70 year old female who has hadprogressive left shoulder pain which has failed nonoperative management in the form of injections and medications.  She showed loss of joint space on x-ray and flattening of the humeral head.  CT scan showed medialization of the glenoid but some glenoid bone stock preserved and I felt that a total shoulder would be possible.  She has failed nonoperative management and it was miserable.  She wanted to go ahead with surgical management.  She was unable to sleep at night, unable to perform her activities of daily living secondary to her shoulder pain. She understood risks, benefits, and alternatives of the surgery including, but not limited to risk of bleeding, infection, damage to neurovascular structures, risk of stiffness, instability, and the possibility of revision surgery.  We talked about taking cultures at the time of surgery given the fact she has had surgery in the shoulder before.  Suspicion for infection was low.  OPERATIVE FINDINGS:  Examination under  anesthesia demonstrated range of motion with 170 degrees forward flexion.  External rotation to about 25 degrees.  A DePuy AP total shoulder was placed with a 12 stem and a 44 x 15 eccentric head with a 44 anchor PEG glenoid.  Good soft tissue tension was noted with no instability.  The glenoid was cemented and the humerus was press fit.  PROCEDURE:  The patient was identified in the preoperative holding area where I personally marked the operative site after verifying site, side, and procedure with the patient.  She was taken back to the operating room where she was transferred to the operative table.  General anesthesia was induced without complication.  She was placed in the beach chair position with all extremities carefully padded in position and the head and neck carefully positioned.  She did receive interscalene block in the holding area by the attending anesthesiologist.  She received a gram of Ancef within 30 minutes of the incision.  The beach chair position was set up with the back raised about 30 degrees.  The left upper extremity was then prepped and draped in a standard sterile fashion.  The appropriate time-out procedure was carried out by myself, the operative staff, and anesthesia staff.  An approximately 10 cm incision was made from the tip of the coracoid to the center point  of the humerus at the level of the axilla.  Dissection was carried down sharply through subcutaneous tissues.  The cephalic vein was identified and taken laterally with the deltoid.  The pectoralis major was taken medially.  The upper 1 cm of the pectoralis major was released from its attachment on the humerus.  Clavipectoral fascia was incised just lateral to the conjoined tendon.  Digital palpation was used to prove the integrity of the axillary nerve which was protected throughout the procedure.  The musculocutaneous nerve was not palpated in the operative field.  The conjoined tendon was  then retracted gently medially and the deltoid laterally.  The anterior circumflex humeral vessels were clamped and coagulated.  The soft tissues overlying the biceps were noted to be significantly bulged out with underlying tissue and fluid swelling in the bicipital sheath.  I did aspirate the fluid from the sheath to send for a culture prior to opening the sheath.  Once I opened the sheath, she was noted to have significant tearing of the long head biceps with synovitis extending out from the joint into the bicipital sheath.  This was all removed and discarded.  The biceps was tenodesed to the soft tissue just above the pectoralis major.  At this point, the subscapularis was peeled off the lesser tuberosity from lateral to medial and the capsule was released around the neck of the humerus to about the 6 o'clock position.  The humeral head was then delivered with simultaneous adduction, extension, and external rotation.  She had some small inferior humeral osteophytes removed to the anatomic neck of the humerus and the neck was then marked freehand with a 135 guide and cut at approximately 25-30 degrees retroversion within about 3 mm of the cuff reflection posteriorly.  The head size was estimated to be a 44 medium offset.  At that point, the humeral head was retracted posteriorly with a Fukuda retractor and the anterior-inferior capsule was examined.  The synovial lining was hypertrophied and frond-like.  This was reminiscent of rheumatoid pannus.  I resected the anterior synovium and sent this for pathology for examination.  The remaining portion of the biceps anchor and the entire anterior-inferior labrum was then excised.  The posterior labrum was also excised, but the capsule was not released.  The labrum was significantly hypertrophied circumferentially.  There was a fair amount medialization of the glenoid but I felt there was enough bone stock to resurface the glenoid.  The guide  pin was placed bicortically and then the reamer was used to ream a concentric surface, taking very, very minimal bone.  The center hole was then drilled for an anchor PEG glenoid and did not penetrate the glenoid vault.  The 3 peripheral holes were then drilled.  The posterior inferior hole did slightly exit the glenoid vault.  The anchor PEG glenoid was placed and impacted with pressure cementation on the anterior and superior holes with a small dab of cement placed in the posterior hole but no pressurization given the penetration.  The glenoid placed was a 44 component.  The proximal humerus was then again exposed taking care not to displace the glenoid. The humerus was then reamed from 6-12 mm by 2 mm increments.  A 12 mm reamer was felt to have the appropriate cortical contact.  A 12 mm box osteotome was then used and then a 12 mm broach.  The broach handle was removed and the trial head was placed.  The 44 x 15 eccentric head was  felt to be the best fit.  With trial implant, the component did not subluxate more than 50% posteriorly.  It did not quite snap back again but it was felt that tension was appropriate.  With forward elevation, there was no tendency towards posterior subluxation.  The trials were removed and the final implant was prepared on the back table.  Through bone holes in the lesser tuberosity, four #2 FiberWire's were placed for repair of the subscapularis.  The implant was then impacted and achieved excellent anatomic reconstruction of the proximal humerus with a good press fit.  The subscapularis was then repaired with the 4-0 Ethibond with 1 area that was felt to be still elevated slightly and so I placed a 5th #2 Ethibond passing through the bone to augment the repair.  The repair was felt to be good.  I could easily rotate out to about 35-40 degrees external rotation without any significant undue tension on the repair.  The joint was copiously irrigated with  pulse lavage prior to closure of the subscapularis and again afterwards.  A medium Hemovac was placed beneath the deltoid insertion and wound was then subsequently closed with 2-0 Vicryl in a deep dermal layer and 4-0 Monocryl for skin closure.  Sterile dressings were applied including Steri-Strips 4x4s, ABDs, and tape.  She was placed in a sling, allowed to awaken from general anesthesia, and transferred to the recovery room in stable condition.  POSTOPERATIVE PLAN:  She will be allowed to begin early passive range of motion with a goal of 30 degrees external rotation, 130 degrees forward flexion.  No active motion.  No internal rotation.  She will likely be kept in the hospital 2 nights and then discharged home with her family.     Jones Broom, MD     JC/MEDQ  D:  09/23/2011  T:  09/23/2011  Job:  [id number]  Electronically Signed by Jones Broom  on 09/27/2011 01:48:43 PM

## 2011-10-07 LAB — ANAEROBIC CULTURE

## 2011-11-28 DIAGNOSIS — K5730 Diverticulosis of large intestine without perforation or abscess without bleeding: Secondary | ICD-10-CM | POA: Diagnosis not present

## 2011-11-28 DIAGNOSIS — K449 Diaphragmatic hernia without obstruction or gangrene: Secondary | ICD-10-CM | POA: Diagnosis not present

## 2011-11-28 DIAGNOSIS — D126 Benign neoplasm of colon, unspecified: Secondary | ICD-10-CM | POA: Diagnosis not present

## 2011-11-28 DIAGNOSIS — I10 Essential (primary) hypertension: Secondary | ICD-10-CM | POA: Diagnosis not present

## 2011-11-28 DIAGNOSIS — R1310 Dysphagia, unspecified: Secondary | ICD-10-CM | POA: Diagnosis not present

## 2011-11-28 DIAGNOSIS — Z8719 Personal history of other diseases of the digestive system: Secondary | ICD-10-CM | POA: Diagnosis not present

## 2011-11-28 DIAGNOSIS — Z800 Family history of malignant neoplasm of digestive organs: Secondary | ICD-10-CM | POA: Diagnosis not present

## 2011-11-28 DIAGNOSIS — M129 Arthropathy, unspecified: Secondary | ICD-10-CM | POA: Diagnosis not present

## 2011-11-28 DIAGNOSIS — E7800 Pure hypercholesterolemia, unspecified: Secondary | ICD-10-CM | POA: Diagnosis not present

## 2011-11-28 DIAGNOSIS — K219 Gastro-esophageal reflux disease without esophagitis: Secondary | ICD-10-CM | POA: Diagnosis not present

## 2011-11-28 DIAGNOSIS — K2960 Other gastritis without bleeding: Secondary | ICD-10-CM | POA: Diagnosis not present

## 2011-11-28 DIAGNOSIS — Z79899 Other long term (current) drug therapy: Secondary | ICD-10-CM | POA: Diagnosis not present

## 2011-11-28 DIAGNOSIS — K224 Dyskinesia of esophagus: Secondary | ICD-10-CM | POA: Diagnosis not present

## 2011-12-17 DIAGNOSIS — H4011X0 Primary open-angle glaucoma, stage unspecified: Secondary | ICD-10-CM | POA: Diagnosis not present

## 2011-12-24 DIAGNOSIS — M19019 Primary osteoarthritis, unspecified shoulder: Secondary | ICD-10-CM | POA: Diagnosis not present

## 2011-12-31 DIAGNOSIS — R3915 Urgency of urination: Secondary | ICD-10-CM | POA: Diagnosis not present

## 2011-12-31 DIAGNOSIS — N3941 Urge incontinence: Secondary | ICD-10-CM | POA: Diagnosis not present

## 2011-12-31 DIAGNOSIS — N390 Urinary tract infection, site not specified: Secondary | ICD-10-CM | POA: Diagnosis not present

## 2012-01-14 DIAGNOSIS — E7800 Pure hypercholesterolemia, unspecified: Secondary | ICD-10-CM | POA: Diagnosis not present

## 2012-01-14 DIAGNOSIS — F329 Major depressive disorder, single episode, unspecified: Secondary | ICD-10-CM | POA: Diagnosis not present

## 2012-01-14 DIAGNOSIS — K219 Gastro-esophageal reflux disease without esophagitis: Secondary | ICD-10-CM | POA: Diagnosis not present

## 2012-01-14 DIAGNOSIS — I10 Essential (primary) hypertension: Secondary | ICD-10-CM | POA: Diagnosis not present

## 2012-01-16 DIAGNOSIS — M6281 Muscle weakness (generalized): Secondary | ICD-10-CM | POA: Diagnosis not present

## 2012-01-16 DIAGNOSIS — IMO0001 Reserved for inherently not codable concepts without codable children: Secondary | ICD-10-CM | POA: Diagnosis not present

## 2012-01-16 DIAGNOSIS — Z96619 Presence of unspecified artificial shoulder joint: Secondary | ICD-10-CM | POA: Diagnosis not present

## 2012-01-16 DIAGNOSIS — Z471 Aftercare following joint replacement surgery: Secondary | ICD-10-CM | POA: Diagnosis not present

## 2012-01-16 DIAGNOSIS — M25519 Pain in unspecified shoulder: Secondary | ICD-10-CM | POA: Diagnosis not present

## 2012-01-16 DIAGNOSIS — M25619 Stiffness of unspecified shoulder, not elsewhere classified: Secondary | ICD-10-CM | POA: Diagnosis not present

## 2012-01-16 DIAGNOSIS — R293 Abnormal posture: Secondary | ICD-10-CM | POA: Diagnosis not present

## 2012-01-21 DIAGNOSIS — R351 Nocturia: Secondary | ICD-10-CM | POA: Diagnosis not present

## 2012-01-21 DIAGNOSIS — N390 Urinary tract infection, site not specified: Secondary | ICD-10-CM | POA: Diagnosis not present

## 2012-01-21 DIAGNOSIS — N3941 Urge incontinence: Secondary | ICD-10-CM | POA: Diagnosis not present

## 2012-01-22 DIAGNOSIS — Z471 Aftercare following joint replacement surgery: Secondary | ICD-10-CM | POA: Diagnosis not present

## 2012-01-22 DIAGNOSIS — IMO0001 Reserved for inherently not codable concepts without codable children: Secondary | ICD-10-CM | POA: Diagnosis not present

## 2012-01-22 DIAGNOSIS — M25619 Stiffness of unspecified shoulder, not elsewhere classified: Secondary | ICD-10-CM | POA: Diagnosis not present

## 2012-01-22 DIAGNOSIS — M25519 Pain in unspecified shoulder: Secondary | ICD-10-CM | POA: Diagnosis not present

## 2012-01-22 DIAGNOSIS — R293 Abnormal posture: Secondary | ICD-10-CM | POA: Diagnosis not present

## 2012-01-22 DIAGNOSIS — M6281 Muscle weakness (generalized): Secondary | ICD-10-CM | POA: Diagnosis not present

## 2012-01-26 DIAGNOSIS — R293 Abnormal posture: Secondary | ICD-10-CM | POA: Diagnosis not present

## 2012-01-26 DIAGNOSIS — IMO0001 Reserved for inherently not codable concepts without codable children: Secondary | ICD-10-CM | POA: Diagnosis not present

## 2012-01-26 DIAGNOSIS — M6281 Muscle weakness (generalized): Secondary | ICD-10-CM | POA: Diagnosis not present

## 2012-01-26 DIAGNOSIS — M25519 Pain in unspecified shoulder: Secondary | ICD-10-CM | POA: Diagnosis not present

## 2012-01-26 DIAGNOSIS — Z96619 Presence of unspecified artificial shoulder joint: Secondary | ICD-10-CM | POA: Diagnosis not present

## 2012-01-26 DIAGNOSIS — M25619 Stiffness of unspecified shoulder, not elsewhere classified: Secondary | ICD-10-CM | POA: Diagnosis not present

## 2012-01-26 DIAGNOSIS — Z471 Aftercare following joint replacement surgery: Secondary | ICD-10-CM | POA: Diagnosis not present

## 2012-02-03 DIAGNOSIS — F329 Major depressive disorder, single episode, unspecified: Secondary | ICD-10-CM | POA: Diagnosis not present

## 2012-02-03 DIAGNOSIS — I10 Essential (primary) hypertension: Secondary | ICD-10-CM | POA: Diagnosis not present

## 2012-02-03 DIAGNOSIS — R609 Edema, unspecified: Secondary | ICD-10-CM | POA: Diagnosis not present

## 2012-02-03 DIAGNOSIS — K219 Gastro-esophageal reflux disease without esophagitis: Secondary | ICD-10-CM | POA: Diagnosis not present

## 2012-02-04 DIAGNOSIS — N3941 Urge incontinence: Secondary | ICD-10-CM | POA: Diagnosis not present

## 2012-02-06 DIAGNOSIS — L719 Rosacea, unspecified: Secondary | ICD-10-CM | POA: Diagnosis not present

## 2012-02-10 DIAGNOSIS — L259 Unspecified contact dermatitis, unspecified cause: Secondary | ICD-10-CM | POA: Diagnosis not present

## 2012-02-10 DIAGNOSIS — M19079 Primary osteoarthritis, unspecified ankle and foot: Secondary | ICD-10-CM | POA: Diagnosis not present

## 2012-02-17 DIAGNOSIS — K219 Gastro-esophageal reflux disease without esophagitis: Secondary | ICD-10-CM | POA: Diagnosis not present

## 2012-02-17 DIAGNOSIS — D649 Anemia, unspecified: Secondary | ICD-10-CM | POA: Diagnosis not present

## 2012-02-17 DIAGNOSIS — E7800 Pure hypercholesterolemia, unspecified: Secondary | ICD-10-CM | POA: Diagnosis not present

## 2012-02-17 DIAGNOSIS — F329 Major depressive disorder, single episode, unspecified: Secondary | ICD-10-CM | POA: Diagnosis not present

## 2012-02-17 DIAGNOSIS — I10 Essential (primary) hypertension: Secondary | ICD-10-CM | POA: Diagnosis not present

## 2012-02-24 DIAGNOSIS — M25519 Pain in unspecified shoulder: Secondary | ICD-10-CM | POA: Diagnosis not present

## 2012-02-24 DIAGNOSIS — M25619 Stiffness of unspecified shoulder, not elsewhere classified: Secondary | ICD-10-CM | POA: Diagnosis not present

## 2012-02-24 DIAGNOSIS — IMO0001 Reserved for inherently not codable concepts without codable children: Secondary | ICD-10-CM | POA: Diagnosis not present

## 2012-02-24 DIAGNOSIS — M6281 Muscle weakness (generalized): Secondary | ICD-10-CM | POA: Diagnosis not present

## 2012-02-24 DIAGNOSIS — R293 Abnormal posture: Secondary | ICD-10-CM | POA: Diagnosis not present

## 2012-02-24 DIAGNOSIS — Z96619 Presence of unspecified artificial shoulder joint: Secondary | ICD-10-CM | POA: Diagnosis not present

## 2012-02-24 DIAGNOSIS — Z471 Aftercare following joint replacement surgery: Secondary | ICD-10-CM | POA: Diagnosis not present

## 2012-02-26 DIAGNOSIS — R293 Abnormal posture: Secondary | ICD-10-CM | POA: Diagnosis not present

## 2012-02-26 DIAGNOSIS — IMO0001 Reserved for inherently not codable concepts without codable children: Secondary | ICD-10-CM | POA: Diagnosis not present

## 2012-02-26 DIAGNOSIS — M25619 Stiffness of unspecified shoulder, not elsewhere classified: Secondary | ICD-10-CM | POA: Diagnosis not present

## 2012-02-26 DIAGNOSIS — Z471 Aftercare following joint replacement surgery: Secondary | ICD-10-CM | POA: Diagnosis not present

## 2012-02-26 DIAGNOSIS — M6281 Muscle weakness (generalized): Secondary | ICD-10-CM | POA: Diagnosis not present

## 2012-02-26 DIAGNOSIS — M25519 Pain in unspecified shoulder: Secondary | ICD-10-CM | POA: Diagnosis not present

## 2012-03-02 DIAGNOSIS — I10 Essential (primary) hypertension: Secondary | ICD-10-CM | POA: Diagnosis not present

## 2012-03-02 DIAGNOSIS — M6281 Muscle weakness (generalized): Secondary | ICD-10-CM | POA: Diagnosis not present

## 2012-03-02 DIAGNOSIS — R293 Abnormal posture: Secondary | ICD-10-CM | POA: Diagnosis not present

## 2012-03-02 DIAGNOSIS — M25619 Stiffness of unspecified shoulder, not elsewhere classified: Secondary | ICD-10-CM | POA: Diagnosis not present

## 2012-03-02 DIAGNOSIS — K219 Gastro-esophageal reflux disease without esophagitis: Secondary | ICD-10-CM | POA: Diagnosis not present

## 2012-03-02 DIAGNOSIS — F329 Major depressive disorder, single episode, unspecified: Secondary | ICD-10-CM | POA: Diagnosis not present

## 2012-03-02 DIAGNOSIS — Z471 Aftercare following joint replacement surgery: Secondary | ICD-10-CM | POA: Diagnosis not present

## 2012-03-02 DIAGNOSIS — M25519 Pain in unspecified shoulder: Secondary | ICD-10-CM | POA: Diagnosis not present

## 2012-03-02 DIAGNOSIS — E7800 Pure hypercholesterolemia, unspecified: Secondary | ICD-10-CM | POA: Diagnosis not present

## 2012-03-02 DIAGNOSIS — IMO0001 Reserved for inherently not codable concepts without codable children: Secondary | ICD-10-CM | POA: Diagnosis not present

## 2012-03-05 DIAGNOSIS — R293 Abnormal posture: Secondary | ICD-10-CM | POA: Diagnosis not present

## 2012-03-05 DIAGNOSIS — M25519 Pain in unspecified shoulder: Secondary | ICD-10-CM | POA: Diagnosis not present

## 2012-03-05 DIAGNOSIS — M25619 Stiffness of unspecified shoulder, not elsewhere classified: Secondary | ICD-10-CM | POA: Diagnosis not present

## 2012-03-05 DIAGNOSIS — Z471 Aftercare following joint replacement surgery: Secondary | ICD-10-CM | POA: Diagnosis not present

## 2012-03-05 DIAGNOSIS — IMO0001 Reserved for inherently not codable concepts without codable children: Secondary | ICD-10-CM | POA: Diagnosis not present

## 2012-03-05 DIAGNOSIS — M6281 Muscle weakness (generalized): Secondary | ICD-10-CM | POA: Diagnosis not present

## 2012-03-09 DIAGNOSIS — L259 Unspecified contact dermatitis, unspecified cause: Secondary | ICD-10-CM | POA: Diagnosis not present

## 2012-03-09 DIAGNOSIS — Z471 Aftercare following joint replacement surgery: Secondary | ICD-10-CM | POA: Diagnosis not present

## 2012-03-09 DIAGNOSIS — M25619 Stiffness of unspecified shoulder, not elsewhere classified: Secondary | ICD-10-CM | POA: Diagnosis not present

## 2012-03-09 DIAGNOSIS — M25519 Pain in unspecified shoulder: Secondary | ICD-10-CM | POA: Diagnosis not present

## 2012-03-09 DIAGNOSIS — R293 Abnormal posture: Secondary | ICD-10-CM | POA: Diagnosis not present

## 2012-03-09 DIAGNOSIS — IMO0001 Reserved for inherently not codable concepts without codable children: Secondary | ICD-10-CM | POA: Diagnosis not present

## 2012-03-09 DIAGNOSIS — M6281 Muscle weakness (generalized): Secondary | ICD-10-CM | POA: Diagnosis not present

## 2012-03-11 DIAGNOSIS — Z471 Aftercare following joint replacement surgery: Secondary | ICD-10-CM | POA: Diagnosis not present

## 2012-03-11 DIAGNOSIS — IMO0001 Reserved for inherently not codable concepts without codable children: Secondary | ICD-10-CM | POA: Diagnosis not present

## 2012-03-11 DIAGNOSIS — M25619 Stiffness of unspecified shoulder, not elsewhere classified: Secondary | ICD-10-CM | POA: Diagnosis not present

## 2012-03-11 DIAGNOSIS — R293 Abnormal posture: Secondary | ICD-10-CM | POA: Diagnosis not present

## 2012-03-11 DIAGNOSIS — M25519 Pain in unspecified shoulder: Secondary | ICD-10-CM | POA: Diagnosis not present

## 2012-03-11 DIAGNOSIS — M6281 Muscle weakness (generalized): Secondary | ICD-10-CM | POA: Diagnosis not present

## 2012-03-15 DIAGNOSIS — I10 Essential (primary) hypertension: Secondary | ICD-10-CM | POA: Diagnosis not present

## 2012-03-15 DIAGNOSIS — K219 Gastro-esophageal reflux disease without esophagitis: Secondary | ICD-10-CM | POA: Diagnosis not present

## 2012-03-15 DIAGNOSIS — E7800 Pure hypercholesterolemia, unspecified: Secondary | ICD-10-CM | POA: Diagnosis not present

## 2012-03-15 DIAGNOSIS — F411 Generalized anxiety disorder: Secondary | ICD-10-CM | POA: Diagnosis not present

## 2012-03-16 DIAGNOSIS — IMO0001 Reserved for inherently not codable concepts without codable children: Secondary | ICD-10-CM | POA: Diagnosis not present

## 2012-03-16 DIAGNOSIS — R293 Abnormal posture: Secondary | ICD-10-CM | POA: Diagnosis not present

## 2012-03-16 DIAGNOSIS — M25619 Stiffness of unspecified shoulder, not elsewhere classified: Secondary | ICD-10-CM | POA: Diagnosis not present

## 2012-03-16 DIAGNOSIS — M25519 Pain in unspecified shoulder: Secondary | ICD-10-CM | POA: Diagnosis not present

## 2012-03-16 DIAGNOSIS — M6281 Muscle weakness (generalized): Secondary | ICD-10-CM | POA: Diagnosis not present

## 2012-03-16 DIAGNOSIS — Z471 Aftercare following joint replacement surgery: Secondary | ICD-10-CM | POA: Diagnosis not present

## 2012-03-23 DIAGNOSIS — IMO0001 Reserved for inherently not codable concepts without codable children: Secondary | ICD-10-CM | POA: Diagnosis not present

## 2012-03-23 DIAGNOSIS — M25519 Pain in unspecified shoulder: Secondary | ICD-10-CM | POA: Diagnosis not present

## 2012-03-23 DIAGNOSIS — M6281 Muscle weakness (generalized): Secondary | ICD-10-CM | POA: Diagnosis not present

## 2012-03-23 DIAGNOSIS — M25619 Stiffness of unspecified shoulder, not elsewhere classified: Secondary | ICD-10-CM | POA: Diagnosis not present

## 2012-03-23 DIAGNOSIS — Z471 Aftercare following joint replacement surgery: Secondary | ICD-10-CM | POA: Diagnosis not present

## 2012-03-23 DIAGNOSIS — R293 Abnormal posture: Secondary | ICD-10-CM | POA: Diagnosis not present

## 2012-03-24 DIAGNOSIS — N3941 Urge incontinence: Secondary | ICD-10-CM | POA: Diagnosis not present

## 2012-03-25 DIAGNOSIS — M25519 Pain in unspecified shoulder: Secondary | ICD-10-CM | POA: Diagnosis not present

## 2012-03-25 DIAGNOSIS — M6281 Muscle weakness (generalized): Secondary | ICD-10-CM | POA: Diagnosis not present

## 2012-03-25 DIAGNOSIS — Z96619 Presence of unspecified artificial shoulder joint: Secondary | ICD-10-CM | POA: Diagnosis not present

## 2012-03-25 DIAGNOSIS — IMO0001 Reserved for inherently not codable concepts without codable children: Secondary | ICD-10-CM | POA: Diagnosis not present

## 2012-03-25 DIAGNOSIS — M25619 Stiffness of unspecified shoulder, not elsewhere classified: Secondary | ICD-10-CM | POA: Diagnosis not present

## 2012-03-25 DIAGNOSIS — R293 Abnormal posture: Secondary | ICD-10-CM | POA: Diagnosis not present

## 2012-03-25 DIAGNOSIS — Z471 Aftercare following joint replacement surgery: Secondary | ICD-10-CM | POA: Diagnosis not present

## 2012-04-01 DIAGNOSIS — J209 Acute bronchitis, unspecified: Secondary | ICD-10-CM | POA: Diagnosis not present

## 2012-04-01 DIAGNOSIS — F329 Major depressive disorder, single episode, unspecified: Secondary | ICD-10-CM | POA: Diagnosis not present

## 2012-04-01 DIAGNOSIS — E7800 Pure hypercholesterolemia, unspecified: Secondary | ICD-10-CM | POA: Diagnosis not present

## 2012-04-01 DIAGNOSIS — K219 Gastro-esophageal reflux disease without esophagitis: Secondary | ICD-10-CM | POA: Diagnosis not present

## 2012-04-06 DIAGNOSIS — M6281 Muscle weakness (generalized): Secondary | ICD-10-CM | POA: Diagnosis not present

## 2012-04-06 DIAGNOSIS — IMO0001 Reserved for inherently not codable concepts without codable children: Secondary | ICD-10-CM | POA: Diagnosis not present

## 2012-04-06 DIAGNOSIS — M25619 Stiffness of unspecified shoulder, not elsewhere classified: Secondary | ICD-10-CM | POA: Diagnosis not present

## 2012-04-06 DIAGNOSIS — Z471 Aftercare following joint replacement surgery: Secondary | ICD-10-CM | POA: Diagnosis not present

## 2012-04-06 DIAGNOSIS — R293 Abnormal posture: Secondary | ICD-10-CM | POA: Diagnosis not present

## 2012-04-06 DIAGNOSIS — M25519 Pain in unspecified shoulder: Secondary | ICD-10-CM | POA: Diagnosis not present

## 2012-04-08 DIAGNOSIS — M25519 Pain in unspecified shoulder: Secondary | ICD-10-CM | POA: Diagnosis not present

## 2012-04-08 DIAGNOSIS — M6281 Muscle weakness (generalized): Secondary | ICD-10-CM | POA: Diagnosis not present

## 2012-04-08 DIAGNOSIS — M25619 Stiffness of unspecified shoulder, not elsewhere classified: Secondary | ICD-10-CM | POA: Diagnosis not present

## 2012-04-08 DIAGNOSIS — Z471 Aftercare following joint replacement surgery: Secondary | ICD-10-CM | POA: Diagnosis not present

## 2012-04-08 DIAGNOSIS — R293 Abnormal posture: Secondary | ICD-10-CM | POA: Diagnosis not present

## 2012-04-08 DIAGNOSIS — IMO0001 Reserved for inherently not codable concepts without codable children: Secondary | ICD-10-CM | POA: Diagnosis not present

## 2012-04-13 DIAGNOSIS — Z471 Aftercare following joint replacement surgery: Secondary | ICD-10-CM | POA: Diagnosis not present

## 2012-04-13 DIAGNOSIS — M25619 Stiffness of unspecified shoulder, not elsewhere classified: Secondary | ICD-10-CM | POA: Diagnosis not present

## 2012-04-13 DIAGNOSIS — R293 Abnormal posture: Secondary | ICD-10-CM | POA: Diagnosis not present

## 2012-04-13 DIAGNOSIS — IMO0001 Reserved for inherently not codable concepts without codable children: Secondary | ICD-10-CM | POA: Diagnosis not present

## 2012-04-13 DIAGNOSIS — M25519 Pain in unspecified shoulder: Secondary | ICD-10-CM | POA: Diagnosis not present

## 2012-04-13 DIAGNOSIS — M6281 Muscle weakness (generalized): Secondary | ICD-10-CM | POA: Diagnosis not present

## 2012-04-15 DIAGNOSIS — M25519 Pain in unspecified shoulder: Secondary | ICD-10-CM | POA: Diagnosis not present

## 2012-04-15 DIAGNOSIS — Z471 Aftercare following joint replacement surgery: Secondary | ICD-10-CM | POA: Diagnosis not present

## 2012-04-15 DIAGNOSIS — N3941 Urge incontinence: Secondary | ICD-10-CM | POA: Diagnosis not present

## 2012-04-15 DIAGNOSIS — IMO0001 Reserved for inherently not codable concepts without codable children: Secondary | ICD-10-CM | POA: Diagnosis not present

## 2012-04-15 DIAGNOSIS — R293 Abnormal posture: Secondary | ICD-10-CM | POA: Diagnosis not present

## 2012-04-15 DIAGNOSIS — M6281 Muscle weakness (generalized): Secondary | ICD-10-CM | POA: Diagnosis not present

## 2012-04-15 DIAGNOSIS — M25619 Stiffness of unspecified shoulder, not elsewhere classified: Secondary | ICD-10-CM | POA: Diagnosis not present

## 2012-04-27 DIAGNOSIS — N3941 Urge incontinence: Secondary | ICD-10-CM | POA: Diagnosis not present

## 2012-04-27 DIAGNOSIS — R351 Nocturia: Secondary | ICD-10-CM | POA: Diagnosis not present

## 2012-05-06 DIAGNOSIS — L259 Unspecified contact dermatitis, unspecified cause: Secondary | ICD-10-CM | POA: Diagnosis not present

## 2012-05-17 DIAGNOSIS — D649 Anemia, unspecified: Secondary | ICD-10-CM | POA: Diagnosis not present

## 2012-05-17 DIAGNOSIS — E7800 Pure hypercholesterolemia, unspecified: Secondary | ICD-10-CM | POA: Diagnosis not present

## 2012-05-17 DIAGNOSIS — G2589 Other specified extrapyramidal and movement disorders: Secondary | ICD-10-CM | POA: Diagnosis not present

## 2012-05-17 DIAGNOSIS — M159 Polyosteoarthritis, unspecified: Secondary | ICD-10-CM | POA: Diagnosis not present

## 2012-05-17 DIAGNOSIS — K219 Gastro-esophageal reflux disease without esophagitis: Secondary | ICD-10-CM | POA: Diagnosis not present

## 2012-06-16 DIAGNOSIS — H4011X0 Primary open-angle glaucoma, stage unspecified: Secondary | ICD-10-CM | POA: Diagnosis not present

## 2012-06-16 DIAGNOSIS — I10 Essential (primary) hypertension: Secondary | ICD-10-CM | POA: Diagnosis not present

## 2012-06-16 DIAGNOSIS — E7800 Pure hypercholesterolemia, unspecified: Secondary | ICD-10-CM | POA: Diagnosis not present

## 2012-06-16 DIAGNOSIS — G2589 Other specified extrapyramidal and movement disorders: Secondary | ICD-10-CM | POA: Diagnosis not present

## 2012-06-16 DIAGNOSIS — M159 Polyosteoarthritis, unspecified: Secondary | ICD-10-CM | POA: Diagnosis not present

## 2012-07-14 DIAGNOSIS — Z87440 Personal history of urinary (tract) infections: Secondary | ICD-10-CM | POA: Diagnosis not present

## 2012-07-14 DIAGNOSIS — N3941 Urge incontinence: Secondary | ICD-10-CM | POA: Diagnosis not present

## 2012-07-15 DIAGNOSIS — I83893 Varicose veins of bilateral lower extremities with other complications: Secondary | ICD-10-CM | POA: Diagnosis not present

## 2012-07-15 DIAGNOSIS — I739 Peripheral vascular disease, unspecified: Secondary | ICD-10-CM | POA: Diagnosis not present

## 2012-07-19 DIAGNOSIS — I739 Peripheral vascular disease, unspecified: Secondary | ICD-10-CM | POA: Diagnosis not present

## 2012-07-29 DIAGNOSIS — M79609 Pain in unspecified limb: Secondary | ICD-10-CM | POA: Diagnosis not present

## 2012-07-29 DIAGNOSIS — IMO0002 Reserved for concepts with insufficient information to code with codable children: Secondary | ICD-10-CM | POA: Diagnosis not present

## 2012-08-17 DIAGNOSIS — G2589 Other specified extrapyramidal and movement disorders: Secondary | ICD-10-CM | POA: Diagnosis not present

## 2012-08-17 DIAGNOSIS — Z23 Encounter for immunization: Secondary | ICD-10-CM | POA: Diagnosis not present

## 2012-08-17 DIAGNOSIS — J209 Acute bronchitis, unspecified: Secondary | ICD-10-CM | POA: Diagnosis not present

## 2012-08-17 DIAGNOSIS — F329 Major depressive disorder, single episode, unspecified: Secondary | ICD-10-CM | POA: Diagnosis not present

## 2012-08-17 DIAGNOSIS — E7800 Pure hypercholesterolemia, unspecified: Secondary | ICD-10-CM | POA: Diagnosis not present

## 2012-08-18 DIAGNOSIS — M19019 Primary osteoarthritis, unspecified shoulder: Secondary | ICD-10-CM | POA: Diagnosis not present

## 2012-08-19 ENCOUNTER — Other Ambulatory Visit: Payer: Self-pay | Admitting: Orthopedic Surgery

## 2012-08-20 ENCOUNTER — Encounter (HOSPITAL_COMMUNITY): Payer: Self-pay | Admitting: Pharmacy Technician

## 2012-08-20 NOTE — Progress Notes (Signed)
Patient to come in on 08/25/12 for preop appointment at 0945 am.  Need orders in EPIC.  Thank You.

## 2012-08-23 ENCOUNTER — Other Ambulatory Visit: Payer: Self-pay | Admitting: Orthopedic Surgery

## 2012-08-25 ENCOUNTER — Other Ambulatory Visit: Payer: Self-pay

## 2012-08-25 ENCOUNTER — Ambulatory Visit (HOSPITAL_COMMUNITY)
Admission: RE | Admit: 2012-08-25 | Discharge: 2012-08-25 | Disposition: A | Discharge status: Home / Self Care | Payer: Medicare Other | Source: Ambulatory Visit | Attending: Orthopedic Surgery | Admitting: Orthopedic Surgery

## 2012-08-25 ENCOUNTER — Encounter (HOSPITAL_COMMUNITY): Payer: Self-pay

## 2012-08-25 ENCOUNTER — Encounter (HOSPITAL_COMMUNITY)
Admission: RE | Admit: 2012-08-25 | Discharge: 2012-08-25 | Disposition: A | Discharge status: Home / Self Care | Payer: Medicare Other | Source: Ambulatory Visit | Attending: Orthopedic Surgery | Admitting: Orthopedic Surgery

## 2012-08-25 DIAGNOSIS — Z01810 Encounter for preprocedural cardiovascular examination: Secondary | ICD-10-CM | POA: Diagnosis not present

## 2012-08-25 DIAGNOSIS — Z01812 Encounter for preprocedural laboratory examination: Secondary | ICD-10-CM | POA: Diagnosis not present

## 2012-08-25 DIAGNOSIS — E7800 Pure hypercholesterolemia, unspecified: Secondary | ICD-10-CM

## 2012-08-25 DIAGNOSIS — Z01818 Encounter for other preprocedural examination: Secondary | ICD-10-CM | POA: Insufficient documentation

## 2012-08-25 DIAGNOSIS — I700 Atherosclerosis of aorta: Secondary | ICD-10-CM | POA: Diagnosis not present

## 2012-08-25 DIAGNOSIS — K219 Gastro-esophageal reflux disease without esophagitis: Secondary | ICD-10-CM

## 2012-08-25 DIAGNOSIS — I517 Cardiomegaly: Secondary | ICD-10-CM | POA: Insufficient documentation

## 2012-08-25 DIAGNOSIS — H269 Unspecified cataract: Secondary | ICD-10-CM

## 2012-08-25 DIAGNOSIS — M1990 Unspecified osteoarthritis, unspecified site: Secondary | ICD-10-CM

## 2012-08-25 DIAGNOSIS — T8859XA Other complications of anesthesia, initial encounter: Secondary | ICD-10-CM

## 2012-08-25 DIAGNOSIS — N3010 Interstitial cystitis (chronic) without hematuria: Secondary | ICD-10-CM

## 2012-08-25 HISTORY — DX: Interstitial cystitis (chronic) without hematuria: N30.10

## 2012-08-25 HISTORY — DX: Unspecified osteoarthritis, unspecified site: M19.90

## 2012-08-25 HISTORY — DX: Gastro-esophageal reflux disease without esophagitis: K21.9

## 2012-08-25 HISTORY — DX: Pure hypercholesterolemia, unspecified: E78.00

## 2012-08-25 HISTORY — PX: INTERSTIM IMPLANT PLACEMENT: SHX5130

## 2012-08-25 HISTORY — DX: Other complications of anesthesia, initial encounter: T88.59XA

## 2012-08-25 HISTORY — PX: BACK SURGERY: SHX140

## 2012-08-25 HISTORY — DX: Unspecified cataract: H26.9

## 2012-08-25 HISTORY — DX: Essential (primary) hypertension: I10

## 2012-08-25 HISTORY — PX: JOINT REPLACEMENT: SHX530

## 2012-08-25 HISTORY — PX: ABDOMINAL HYSTERECTOMY: SHX81

## 2012-08-25 HISTORY — PX: CHOLECYSTECTOMY: SHX55

## 2012-08-25 HISTORY — DX: Adverse effect of unspecified anesthetic, initial encounter: T41.45XA

## 2012-08-25 LAB — CBC WITH DIFFERENTIAL/PLATELET
Basophils Absolute: 0 10*3/uL (ref 0.0–0.1)
Basophils Relative: 0 % (ref 0–1)
Eosinophils Absolute: 0.1 10*3/uL (ref 0.0–0.7)
HCT: 41.3 % (ref 36.0–46.0)
Hemoglobin: 14.3 g/dL (ref 12.0–15.0)
MCH: 32.3 pg (ref 26.0–34.0)
MCHC: 34.6 g/dL (ref 30.0–36.0)
Monocytes Absolute: 0.4 10*3/uL (ref 0.1–1.0)
Monocytes Relative: 6 % (ref 3–12)
Neutro Abs: 3.8 10*3/uL (ref 1.7–7.7)
RDW: 12.9 % (ref 11.5–15.5)

## 2012-08-25 LAB — COMPREHENSIVE METABOLIC PANEL
AST: 24 U/L (ref 0–37)
Albumin: 3.6 g/dL (ref 3.5–5.2)
BUN: 17 mg/dL (ref 6–23)
Calcium: 9.3 mg/dL (ref 8.4–10.5)
Creatinine, Ser: 0.71 mg/dL (ref 0.50–1.10)
Total Bilirubin: 0.4 mg/dL (ref 0.3–1.2)
Total Protein: 6.9 g/dL (ref 6.0–8.3)

## 2012-08-25 LAB — URINALYSIS, ROUTINE W REFLEX MICROSCOPIC
Bilirubin Urine: NEGATIVE
Leukocytes, UA: NEGATIVE
Nitrite: NEGATIVE
Specific Gravity, Urine: 1.017 (ref 1.005–1.030)
Urobilinogen, UA: 0.2 mg/dL (ref 0.0–1.0)
pH: 6 (ref 5.0–8.0)

## 2012-08-25 LAB — PROTIME-INR
INR: 0.98 (ref 0.00–1.49)
Prothrombin Time: 12.9 seconds (ref 11.6–15.2)

## 2012-08-25 LAB — SURGICAL PCR SCREEN: Staphylococcus aureus: NEGATIVE

## 2012-08-25 LAB — APTT: aPTT: 32 seconds (ref 24–37)

## 2012-08-25 NOTE — Patient Instructions (Addendum)
20 [name]  08/25/2012   Your procedure is scheduled on: 10-17  -2013  Report to [facility] at     0830   AM..  Call this number if you have problems the morning of surgery: [phone]  Or Presurgical Testing [phone](Rebecca Cairns)   Remember:   Do not eat food:After Midnight.  Take these medicines the morning of surgery with A SIP OF WATER: Atenolol. Vicodin. Prilosec. Paxil. Pravachol. Use and bring eye drops. Bring Interstim remote to hospital.   Do not wear jewelry, make-up or nail polish.  Do not wear lotions, powders, or perfumes. You may wear deodorant.  Do not shave 48 hours prior to surgery.(face and neck okay, no shaving of legs)  Do not bring valuables to the hospital.  Contacts, dentures or bridgework may not be worn into surgery.  Leave suitcase in the car. After surgery it may be brought to your room.  For patients admitted to the hospital, checkout time is 11:00 AM the day of discharge.   Patients discharged the day of surgery will not be allowed to drive home. Must have responsible person with you x 24 hours once discharged.  Name and phone number of your driver: spouse- [name]- [phone]  Special Instructions: CHG Shower Use Special Wash: see special instruction sheet.(avoid face and genitals)   Please read over the following fact sheets that you were given: MRSA Information, Blood Transfusion fact sheet, Incentive Spirometry Instruction.

## 2012-08-26 ENCOUNTER — Other Ambulatory Visit: Payer: Self-pay | Admitting: Orthopedic Surgery

## 2012-09-03 DIAGNOSIS — K219 Gastro-esophageal reflux disease without esophagitis: Secondary | ICD-10-CM | POA: Diagnosis not present

## 2012-09-03 DIAGNOSIS — E7800 Pure hypercholesterolemia, unspecified: Secondary | ICD-10-CM | POA: Diagnosis not present

## 2012-09-03 DIAGNOSIS — D649 Anemia, unspecified: Secondary | ICD-10-CM | POA: Diagnosis not present

## 2012-09-03 DIAGNOSIS — Z6833 Body mass index (BMI) 33.0-33.9, adult: Secondary | ICD-10-CM | POA: Diagnosis not present

## 2012-09-03 DIAGNOSIS — G2589 Other specified extrapyramidal and movement disorders: Secondary | ICD-10-CM | POA: Diagnosis not present

## 2012-09-06 DIAGNOSIS — M25519 Pain in unspecified shoulder: Secondary | ICD-10-CM | POA: Diagnosis not present

## 2012-09-09 ENCOUNTER — Encounter (HOSPITAL_COMMUNITY): Payer: Self-pay | Admitting: Anesthesiology

## 2012-09-09 ENCOUNTER — Ambulatory Visit (HOSPITAL_COMMUNITY): Payer: Medicare Other | Admitting: Anesthesiology

## 2012-09-09 ENCOUNTER — Encounter (HOSPITAL_COMMUNITY): Payer: Self-pay

## 2012-09-09 ENCOUNTER — Encounter (HOSPITAL_COMMUNITY): Payer: Self-pay | Admitting: Orthopedic Surgery

## 2012-09-09 ENCOUNTER — Encounter (HOSPITAL_COMMUNITY)
Admission: RE | Disposition: A | Discharge status: Home / Self Care | Payer: Self-pay | Source: Ambulatory Visit | Attending: Orthopedic Surgery

## 2012-09-09 ENCOUNTER — Ambulatory Visit (HOSPITAL_COMMUNITY): Payer: Medicare Other

## 2012-09-09 ENCOUNTER — Inpatient Hospital Stay (HOSPITAL_COMMUNITY)
Admission: RE | Admit: 2012-09-09 | Discharge: 2012-09-11 | DRG: 484 | Disposition: A | Discharge status: Home / Self Care | Payer: Medicare Other | Source: Ambulatory Visit | Attending: Orthopedic Surgery | Admitting: Orthopedic Surgery

## 2012-09-09 DIAGNOSIS — T84099A Other mechanical complication of unspecified internal joint prosthesis, initial encounter: Principal | ICD-10-CM | POA: Diagnosis present

## 2012-09-09 DIAGNOSIS — G8918 Other acute postprocedural pain: Secondary | ICD-10-CM | POA: Diagnosis not present

## 2012-09-09 DIAGNOSIS — I10 Essential (primary) hypertension: Secondary | ICD-10-CM | POA: Diagnosis not present

## 2012-09-09 DIAGNOSIS — K219 Gastro-esophageal reflux disease without esophagitis: Secondary | ICD-10-CM | POA: Diagnosis not present

## 2012-09-09 DIAGNOSIS — Y831 Surgical operation with implant of artificial internal device as the cause of abnormal reaction of the patient, or of later complication, without mention of misadventure at the time of the procedure: Secondary | ICD-10-CM | POA: Diagnosis present

## 2012-09-09 DIAGNOSIS — Z96619 Presence of unspecified artificial shoulder joint: Secondary | ICD-10-CM

## 2012-09-09 DIAGNOSIS — T84498A Other mechanical complication of other internal orthopedic devices, implants and grafts, initial encounter: Secondary | ICD-10-CM | POA: Diagnosis not present

## 2012-09-09 DIAGNOSIS — T8489XA Other specified complication of internal orthopedic prosthetic devices, implants and grafts, initial encounter: Secondary | ICD-10-CM | POA: Diagnosis not present

## 2012-09-09 DIAGNOSIS — M25519 Pain in unspecified shoulder: Secondary | ICD-10-CM | POA: Diagnosis present

## 2012-09-09 DIAGNOSIS — M25512 Pain in left shoulder: Secondary | ICD-10-CM | POA: Diagnosis present

## 2012-09-09 DIAGNOSIS — Z471 Aftercare following joint replacement surgery: Secondary | ICD-10-CM | POA: Diagnosis not present

## 2012-09-09 HISTORY — PX: REVERSE SHOULDER ARTHROPLASTY: SHX5054

## 2012-09-09 LAB — TYPE AND SCREEN: Antibody Screen: NEGATIVE

## 2012-09-09 SURGERY — REVISION, TOTAL ARTHROPLASTY, SHOULDER
Anesthesia: General | Site: Shoulder | Laterality: Left | Wound class: Clean

## 2012-09-09 MED ORDER — DIPHENHYDRAMINE HCL 12.5 MG/5ML PO ELIX: 12.5000 mg | ORAL_SOLUTION | ORAL | Status: DC | PRN

## 2012-09-09 MED ORDER — CEFAZOLIN SODIUM-DEXTROSE 2-3 GM-% IV SOLR
INTRAVENOUS | Status: AC
  Filled 2012-09-09: qty 50

## 2012-09-09 MED ORDER — METOCLOPRAMIDE HCL 5 MG/ML IJ SOLN: 5.0000 mg | Freq: Three times a day (TID) | INTRAMUSCULAR | Status: DC | PRN

## 2012-09-09 MED ORDER — ATENOLOL 100 MG PO TABS
100.0000 mg | ORAL_TABLET | Freq: Every day | ORAL | Status: DC
  Administered 2012-09-11: 100 mg via ORAL
  Filled 2012-09-09 (×3): qty 1

## 2012-09-09 MED ORDER — LIDOCAINE HCL (CARDIAC) 20 MG/ML IV SOLN
INTRAVENOUS | Status: DC | PRN
  Administered 2012-09-09: 50 mg via INTRAVENOUS
  Administered 2012-09-09: 40 mg via INTRAVENOUS

## 2012-09-09 MED ORDER — BISACODYL 5 MG PO TBEC: 5.0000 mg | DELAYED_RELEASE_TABLET | Freq: Every day | ORAL | Status: DC | PRN

## 2012-09-09 MED ORDER — ONDANSETRON HCL 4 MG/2ML IJ SOLN: 4.0000 mg | Freq: Four times a day (QID) | INTRAMUSCULAR | Status: DC | PRN

## 2012-09-09 MED ORDER — METHOCARBAMOL 100 MG/ML IJ SOLN
500.0000 mg | Freq: Four times a day (QID) | INTRAVENOUS | Status: DC | PRN
  Administered 2012-09-10: 500 mg via INTRAVENOUS
  Filled 2012-09-09 (×2): qty 5

## 2012-09-09 MED ORDER — HYDROCODONE-ACETAMINOPHEN 7.5-325 MG PO TABS
1.0000 | ORAL_TABLET | ORAL | Status: DC | PRN
  Administered 2012-09-10 (×2): 2 via ORAL
  Administered 2012-09-10 – 2012-09-11 (×2): 1 via ORAL
  Administered 2012-09-11: 2 via ORAL
  Filled 2012-09-09 (×3): qty 2
  Filled 2012-09-09: qty 1
  Filled 2012-09-09 (×3): qty 2
  Filled 2012-09-09: qty 1

## 2012-09-09 MED ORDER — MIDAZOLAM HCL 2 MG/2ML IJ SOLN
1.0000 mg | INTRAMUSCULAR | Status: DC | PRN
  Administered 2012-09-09: 1 mg via INTRAVENOUS

## 2012-09-09 MED ORDER — LIDOCAINE HCL 4 % MT SOLN
OROMUCOSAL | Status: DC | PRN
  Administered 2012-09-09: 4 mL via TOPICAL

## 2012-09-09 MED ORDER — ZOLPIDEM TARTRATE 5 MG PO TABS: 5.0000 mg | ORAL_TABLET | Freq: Every evening | ORAL | Status: DC | PRN

## 2012-09-09 MED ORDER — PROPOFOL 10 MG/ML IV BOLUS
INTRAVENOUS | Status: DC | PRN
  Administered 2012-09-09: 50 mg via INTRAVENOUS
  Administered 2012-09-09: 150 mg via INTRAVENOUS

## 2012-09-09 MED ORDER — FENTANYL CITRATE 0.05 MG/ML IJ SOLN
50.0000 ug | Freq: Once | INTRAMUSCULAR | Status: AC
  Administered 2012-09-09: 50 ug via INTRAVENOUS

## 2012-09-09 MED ORDER — ROCURONIUM BROMIDE 100 MG/10ML IV SOLN
INTRAVENOUS | Status: DC | PRN
  Administered 2012-09-09: 10 mg via INTRAVENOUS
  Administered 2012-09-09: 40 mg via INTRAVENOUS

## 2012-09-09 MED ORDER — PAROXETINE HCL 20 MG PO TABS
40.0000 mg | ORAL_TABLET | Freq: Every day | ORAL | Status: DC
  Administered 2012-09-10 – 2012-09-11 (×2): 40 mg via ORAL
  Filled 2012-09-09 (×3): qty 2

## 2012-09-09 MED ORDER — MEPERIDINE HCL 50 MG/ML IJ SOLN: 6.2500 mg | INTRAMUSCULAR | Status: DC | PRN

## 2012-09-09 MED ORDER — SODIUM CHLORIDE 0.9 % IV SOLN
INTRAVENOUS | Status: DC
  Administered 2012-09-09: 18:00:00 via INTRAVENOUS

## 2012-09-09 MED ORDER — ALUM & MAG HYDROXIDE-SIMETH 200-200-20 MG/5ML PO SUSP: 30.0000 mL | ORAL | Status: DC | PRN

## 2012-09-09 MED ORDER — SIMVASTATIN 20 MG PO TABS
20.0000 mg | ORAL_TABLET | Freq: Every day | ORAL | Status: DC
  Administered 2012-09-09 – 2012-09-10 (×2): 20 mg via ORAL
  Filled 2012-09-09 (×3): qty 1

## 2012-09-09 MED ORDER — CEFAZOLIN SODIUM-DEXTROSE 2-3 GM-% IV SOLR
2.0000 g | INTRAVENOUS | Status: AC
  Administered 2012-09-09: 2 g via INTRAVENOUS

## 2012-09-09 MED ORDER — NEOSTIGMINE METHYLSULFATE 1 MG/ML IJ SOLN
INTRAMUSCULAR | Status: DC | PRN
  Administered 2012-09-09: 2 mg via INTRAVENOUS

## 2012-09-09 MED ORDER — FLEET ENEMA 7-19 GM/118ML RE ENEM: 1.0000 | ENEMA | Freq: Once | RECTAL | Status: AC | PRN

## 2012-09-09 MED ORDER — ASPIRIN EC 325 MG PO TBEC
325.0000 mg | DELAYED_RELEASE_TABLET | Freq: Two times a day (BID) | ORAL | Status: DC
  Administered 2012-09-09 – 2012-09-11 (×4): 325 mg via ORAL
  Filled 2012-09-09 (×6): qty 1

## 2012-09-09 MED ORDER — ACETAMINOPHEN 325 MG PO TABS
650.0000 mg | ORAL_TABLET | Freq: Four times a day (QID) | ORAL | Status: DC | PRN
  Administered 2012-09-10: 650 mg via ORAL
  Filled 2012-09-09: qty 2

## 2012-09-09 MED ORDER — SODIUM CHLORIDE 0.9 % IR SOLN
Status: DC | PRN
  Administered 2012-09-09: 3000 mL

## 2012-09-09 MED ORDER — GLYCOPYRROLATE 0.2 MG/ML IJ SOLN
INTRAMUSCULAR | Status: DC | PRN
  Administered 2012-09-09 (×2): 0.2 mg via INTRAVENOUS

## 2012-09-09 MED ORDER — LACTATED RINGERS IV SOLN
INTRAVENOUS | Status: DC | PRN
  Administered 2012-09-09 (×3): via INTRAVENOUS

## 2012-09-09 MED ORDER — POVIDONE-IODINE 7.5 % EX SOLN: Freq: Once | CUTANEOUS | Status: DC

## 2012-09-09 MED ORDER — LATANOPROST 0.005 % OP SOLN
1.0000 [drp] | Freq: Every day | OPHTHALMIC | Status: DC
  Administered 2012-09-09 – 2012-09-10 (×2): 1 [drp] via OPHTHALMIC
  Filled 2012-09-09: qty 2.5

## 2012-09-09 MED ORDER — FENTANYL CITRATE 0.05 MG/ML IJ SOLN
INTRAMUSCULAR | Status: DC | PRN
  Administered 2012-09-09: 50 ug via INTRAVENOUS

## 2012-09-09 MED ORDER — PANTOPRAZOLE SODIUM 40 MG PO TBEC
40.0000 mg | DELAYED_RELEASE_TABLET | Freq: Every day | ORAL | Status: DC
  Administered 2012-09-09 – 2012-09-11 (×3): 40 mg via ORAL
  Filled 2012-09-09 (×3): qty 1

## 2012-09-09 MED ORDER — DOCUSATE SODIUM 100 MG PO CAPS
100.0000 mg | ORAL_CAPSULE | Freq: Two times a day (BID) | ORAL | Status: DC
  Administered 2012-09-09 – 2012-09-11 (×4): 100 mg via ORAL

## 2012-09-09 MED ORDER — METOCLOPRAMIDE HCL 10 MG PO TABS: 5.0000 mg | ORAL_TABLET | Freq: Three times a day (TID) | ORAL | Status: DC | PRN

## 2012-09-09 MED ORDER — ROPIVACAINE HCL 5 MG/ML IJ SOLN
INTRAMUSCULAR | Status: AC
  Filled 2012-09-09: qty 30

## 2012-09-09 MED ORDER — ACETAMINOPHEN 10 MG/ML IV SOLN
INTRAVENOUS | Status: AC
  Filled 2012-09-09: qty 100

## 2012-09-09 MED ORDER — METHOCARBAMOL 500 MG PO TABS
500.0000 mg | ORAL_TABLET | Freq: Four times a day (QID) | ORAL | Status: DC | PRN
  Administered 2012-09-10 – 2012-09-11 (×3): 500 mg via ORAL
  Filled 2012-09-09 (×3): qty 1

## 2012-09-09 MED ORDER — MORPHINE SULFATE 2 MG/ML IJ SOLN: 1.0000 mg | INTRAMUSCULAR | Status: DC | PRN

## 2012-09-09 MED ORDER — TRIAMTERENE-HCTZ 37.5-25 MG PO CAPS
1.0000 | ORAL_CAPSULE | Freq: Every day | ORAL | Status: DC
  Administered 2012-09-10 – 2012-09-11 (×2): 1 via ORAL
  Filled 2012-09-09 (×3): qty 1

## 2012-09-09 MED ORDER — ACETAMINOPHEN 650 MG RE SUPP: 650.0000 mg | Freq: Four times a day (QID) | RECTAL | Status: DC | PRN

## 2012-09-09 MED ORDER — ROPINIROLE HCL 1 MG PO TABS
4.0000 mg | ORAL_TABLET | Freq: Every day | ORAL | Status: DC
  Administered 2012-09-09 – 2012-09-10 (×2): 4 mg via ORAL
  Filled 2012-09-09 (×3): qty 4

## 2012-09-09 MED ORDER — SENNOSIDES-DOCUSATE SODIUM 8.6-50 MG PO TABS
1.0000 | ORAL_TABLET | Freq: Every evening | ORAL | Status: DC | PRN
  Filled 2012-09-09: qty 1

## 2012-09-09 MED ORDER — PROMETHAZINE HCL 25 MG/ML IJ SOLN: 6.2500 mg | INTRAMUSCULAR | Status: DC | PRN

## 2012-09-09 MED ORDER — CEFAZOLIN SODIUM 1-5 GM-% IV SOLN
1.0000 g | Freq: Four times a day (QID) | INTRAVENOUS | Status: AC
  Administered 2012-09-09 – 2012-09-10 (×3): 1 g via INTRAVENOUS
  Filled 2012-09-09 (×3): qty 50

## 2012-09-09 MED ORDER — ONDANSETRON HCL 4 MG PO TABS
4.0000 mg | ORAL_TABLET | Freq: Four times a day (QID) | ORAL | Status: DC | PRN
  Administered 2012-09-10: 4 mg via ORAL
  Filled 2012-09-09: qty 1

## 2012-09-09 MED ORDER — PHENOL 1.4 % MT LIQD: 1.0000 | OROMUCOSAL | Status: DC | PRN

## 2012-09-09 MED ORDER — LACTATED RINGERS IV SOLN: INTRAVENOUS | Status: DC

## 2012-09-09 MED ORDER — ROPIVACAINE HCL 5 MG/ML IJ SOLN
INTRAMUSCULAR | Status: DC | PRN
  Administered 2012-09-09: 30 mL

## 2012-09-09 MED ORDER — LACTATED RINGERS IV SOLN
INTRAVENOUS | Status: DC
  Administered 2012-09-09: 1000 mL via INTRAVENOUS

## 2012-09-09 MED ORDER — ACETAMINOPHEN 10 MG/ML IV SOLN
INTRAVENOUS | Status: DC | PRN
  Administered 2012-09-09: 1000 mg via INTRAVENOUS

## 2012-09-09 MED ORDER — EPHEDRINE SULFATE 50 MG/ML IJ SOLN
INTRAMUSCULAR | Status: DC | PRN
  Administered 2012-09-09 (×3): 10 mg via INTRAVENOUS

## 2012-09-09 MED ORDER — DARIFENACIN HYDROBROMIDE ER 7.5 MG PO TB24
7.5000 mg | ORAL_TABLET | Freq: Every day | ORAL | Status: DC
  Administered 2012-09-09 – 2012-09-11 (×3): 7.5 mg via ORAL
  Filled 2012-09-09 (×3): qty 1

## 2012-09-09 MED ORDER — ONDANSETRON HCL 4 MG/2ML IJ SOLN
INTRAMUSCULAR | Status: DC | PRN
  Administered 2012-09-09: 4 mg via INTRAVENOUS

## 2012-09-09 MED ORDER — FENTANYL CITRATE 0.05 MG/ML IJ SOLN: 25.0000 ug | INTRAMUSCULAR | Status: DC | PRN

## 2012-09-09 MED ORDER — MENTHOL 3 MG MT LOZG: 1.0000 | LOZENGE | OROMUCOSAL | Status: DC | PRN

## 2012-09-09 SURGICAL SUPPLY — 106 items
BAG SPEC THK2 15X12 ZIP CLS (MISCELLANEOUS) ×1
BAG ZIPLOCK 12X15 (MISCELLANEOUS) ×2 IMPLANT
BIT DRILL 1.6MX128 (BIT) ×2 IMPLANT
BIT DRILL 170X2.5X (BIT) IMPLANT
BIT DRL 170X2.5X (BIT) ×1
BLADE MIC 41X13 (BLADE) ×2 IMPLANT
BLADE SAW SAG 73X25 THK (BLADE) ×1
BLADE SAW SGTL 18X1.27X75 (BLADE) ×2 IMPLANT
BLADE SAW SGTL 73X25 THK (BLADE) ×1 IMPLANT
BLADE SAW SGTL 81X20 HD (BLADE) ×1 IMPLANT
BLADE SURG 15 STRL LF DISP TIS (BLADE) ×1 IMPLANT
BLADE SURG 15 STRL SS (BLADE) ×2
BOOTIES KNEE HIGH SLOAN (MISCELLANEOUS) ×5 IMPLANT
BUR CROSS CUT FISSURE 1.2 (BURR) ×2 IMPLANT
BUR EGG ELITE 4.0 (BURR) ×2 IMPLANT
BUR NEURO DRILL SOFT 3.0X3.8M (BURR) ×1 IMPLANT
BUR OVAL 4.0 (BURR) ×2 IMPLANT
CEMENT HV SMART SET (Cement) ×3 IMPLANT
CEMENT RESTRICTOR DEPUY SZ 3 (Cement) ×1 IMPLANT
CHLORAPREP W/TINT 26ML (MISCELLANEOUS) ×2 IMPLANT
CLEANER TIP ELECTROSURG 2X2 (MISCELLANEOUS) ×2 IMPLANT
CLOTH BEACON ORANGE TIMEOUT ST (SAFETY) ×2 IMPLANT
CLSR STERI-STRIP ANTIMIC 1/2X4 (GAUZE/BANDAGES/DRESSINGS) ×1 IMPLANT
COVER SURGICAL LIGHT HANDLE (MISCELLANEOUS) ×2 IMPLANT
DRAPE INCISE IOBAN 66X45 STRL (DRAPES) ×2 IMPLANT
DRAPE ORTHO SPLIT 77X108 STRL (DRAPES) ×4
DRAPE POUCH INSTRU U-SHP 10X18 (DRAPES) ×2 IMPLANT
DRAPE SURG 17X11 SM STRL (DRAPES) ×2 IMPLANT
DRAPE SURG ORHT 6 SPLT 77X108 (DRAPES) ×2 IMPLANT
DRAPE TABLE BACK 44X90 PK DISP (DRAPES) ×2 IMPLANT
DRAPE U-SHAPE 47X51 STRL (DRAPES) ×4 IMPLANT
DRILL 2.5 (BIT) ×2
DRSG ADAPTIC 3X8 NADH LF (GAUZE/BANDAGES/DRESSINGS) ×1 IMPLANT
DRSG MEPILEX BORDER 4X12 (GAUZE/BANDAGES/DRESSINGS) ×1 IMPLANT
DRSG MEPILEX BORDER 4X4 (GAUZE/BANDAGES/DRESSINGS) ×2 IMPLANT
DRSG MEPILEX BORDER 4X8 (GAUZE/BANDAGES/DRESSINGS) ×1 IMPLANT
DRSG PAD ABDOMINAL 8X10 ST (GAUZE/BANDAGES/DRESSINGS) IMPLANT
ELECT BLADE 6.5 EXT (BLADE) ×2 IMPLANT
ELECT BLADE TIP CTD 4 INCH (ELECTRODE) ×2 IMPLANT
ELECT REM PT RETURN 9FT ADLT (ELECTROSURGICAL) ×2
ELECTRODE REM PT RTRN 9FT ADLT (ELECTROSURGICAL) ×1 IMPLANT
EVACUATOR 1/8 PVC DRAIN (DRAIN) ×2 IMPLANT
FACESHIELD LNG OPTICON STERILE (SAFETY) ×6 IMPLANT
GLOVE BIO SURGEON STRL SZ 6.5 (GLOVE) ×2 IMPLANT
GLOVE BIO SURGEON STRL SZ7.5 (GLOVE) ×2 IMPLANT
GLOVE BIOGEL PI IND STRL 6.5 (GLOVE) IMPLANT
GLOVE BIOGEL PI IND STRL 7.0 (GLOVE) ×1 IMPLANT
GLOVE BIOGEL PI IND STRL 7.5 (GLOVE) IMPLANT
GLOVE BIOGEL PI IND STRL 8 (GLOVE) ×1 IMPLANT
GLOVE BIOGEL PI INDICATOR 6.5 (GLOVE) ×1
GLOVE BIOGEL PI INDICATOR 7.0 (GLOVE) ×1
GLOVE BIOGEL PI INDICATOR 7.5 (GLOVE) ×1
GLOVE BIOGEL PI INDICATOR 8 (GLOVE) ×1
GLOVE INDICATOR 6.5 STRL GRN (GLOVE) ×2 IMPLANT
GLOVE ORTHO TXT STRL SZ7.5 (GLOVE) ×5 IMPLANT
GLOVE SURG ORTHO 7.0 STRL STRW (GLOVE) ×2 IMPLANT
GLOVE SURG SS PI 6.5 STRL IVOR (GLOVE) ×1 IMPLANT
GLOVE SURG SS PI 7.0 STRL IVOR (GLOVE) ×2 IMPLANT
GLOVE SURG SS PI 7.5 STRL IVOR (GLOVE) ×1 IMPLANT
GOWN STRL NON-REIN LRG LVL3 (GOWN DISPOSABLE) ×2 IMPLANT
GOWN STRL REIN XL XLG (GOWN DISPOSABLE) ×5 IMPLANT
HANDPIECE INTERPULSE COAX TIP (DISPOSABLE) ×2
HEMOSTAT SURGICEL 4X8 (HEMOSTASIS) ×2 IMPLANT
HOOD PEEL AWAY FACE SHEILD DIS (HOOD) ×4 IMPLANT
HOOD SURGICAL BLUE (PROTECTIVE WEAR) ×4 IMPLANT
KIT BASIN OR (CUSTOM PROCEDURE TRAY) ×2 IMPLANT
MANIFOLD NEPTUNE II (INSTRUMENTS) ×2 IMPLANT
NDL MA TROC 1/2 (NEEDLE) ×1 IMPLANT
NEEDLE MA TROC 1/2 (NEEDLE) IMPLANT
NEEDLE MAYO .5 CIRCLE (NEEDLE) ×1 IMPLANT
NS IRRIG 1000ML POUR BTL (IV SOLUTION) ×2 IMPLANT
PACK SHOULDER CUSTOM OPM052 (CUSTOM PROCEDURE TRAY) ×2 IMPLANT
PIN GUIDE 1.2 (PIN) ×1 IMPLANT
PIN GUIDE GLENOPHERE 1.5MX300M (PIN) ×1 IMPLANT
PIN METAGLENE 2.5 (PIN) ×1 IMPLANT
POSITIONER SURGICAL ARM (MISCELLANEOUS) ×2 IMPLANT
RETRIEVER SUT HEWSON (MISCELLANEOUS) ×1 IMPLANT
SET HNDPC FAN SPRY TIP SCT (DISPOSABLE) ×1 IMPLANT
SET PAD SHOULDER ACCESS (MISCELLANEOUS) ×2 IMPLANT
SLING ARM IMMOBILIZER LRG (SOFTGOODS) ×2 IMPLANT
SLING ARM IMMOBILIZER MED (SOFTGOODS) ×2 IMPLANT
SLING ULTRA II S (ORTHOPEDIC SUPPLIES) ×1 IMPLANT
SMARTMIX MINI TOWER (MISCELLANEOUS) ×2
SPONGE GAUZE 4X4 12PLY (GAUZE/BANDAGES/DRESSINGS) ×2 IMPLANT
SPONGE LAP 18X18 X RAY DECT (DISPOSABLE) ×5 IMPLANT
STAPLER SKIN PROX WIDE 3.9 (STAPLE) ×2 IMPLANT
STAPLER VISISTAT 35W (STAPLE) ×1 IMPLANT
STRIP CLOSURE SKIN 1/2X4 (GAUZE/BANDAGES/DRESSINGS) ×4 IMPLANT
SUCTION FRAZIER 12FR DISP (SUCTIONS) ×1 IMPLANT
SUCTION FRAZIER TIP 10 FR DISP (SUCTIONS) ×2 IMPLANT
SUT ETHIBOND #5 BRAIDED 30INL (SUTURE) ×2 IMPLANT
SUT ETHIBOND 2 OS 4 DA (SUTURE) ×6 IMPLANT
SUT ETHILON 2 0 PS N (SUTURE) ×3 IMPLANT
SUT FIBERWIRE #2 38 T-5 BLUE (SUTURE)
SUT MNCRL AB 4-0 PS2 18 (SUTURE) ×2 IMPLANT
SUT PDS AB 0 CT1 36 (SUTURE) ×2 IMPLANT
SUT VIC AB 0 CT1 36 (SUTURE) ×3 IMPLANT
SUT VIC AB 2-0 CT1 27 (SUTURE) ×4
SUT VIC AB 2-0 CT1 TAPERPNT 27 (SUTURE) ×2 IMPLANT
SUT WIRE 18GA (Wire) ×1 IMPLANT
SUTURE FIBERWR #2 38 T-5 BLUE (SUTURE) IMPLANT
SYRINGE IRR TOOMEY STRL 70CC (SYRINGE) ×1 IMPLANT
TOWEL OR 17X26 10 PK STRL BLUE (TOWEL DISPOSABLE) ×4 IMPLANT
TOWER CARTRIDGE SMART MIX (DISPOSABLE) ×2 IMPLANT
TOWER SMARTMIX MINI (MISCELLANEOUS) ×1 IMPLANT
WATER STERILE IRR 1500ML POUR (IV SOLUTION) ×2 IMPLANT

## 2012-09-09 NOTE — Transfer of Care (Signed)
Immediate Anesthesia Transfer of Care Note  Patient: [name]  Procedure(s) Performed: Procedure(s) (LRB) with comments: TOTAL SHOULDER REVISION (Left) - Reverse total shoulder revision; left shoulder with supraclavicular block REVERSE SHOULDER ARTHROPLASTY (Left) - with left supraclavicular block  Patient Location: PACU  Anesthesia Type: General  Level of Consciousness: awake, alert , oriented and patient cooperative  Airway & Oxygen Therapy: Patient Spontanous Breathing and Patient connected to face mask oxygen  Post-op Assessment: Report given to PACU RN and Post -op Vital signs reviewed and stable  Post vital signs: Reviewed and stable  Complications: No apparent anesthesia complications

## 2012-09-09 NOTE — Anesthesia Postprocedure Evaluation (Signed)
  Anesthesia Post-op Note  Patient: [name]  Procedure(s) Performed: Procedure(s) (LRB): TOTAL SHOULDER REVISION (Left) REVERSE SHOULDER ARTHROPLASTY (Left)  Patient Location: PACU  Anesthesia Type: GA combined with regional for post-op pain  Level of Consciousness: awake and alert   Airway and Oxygen Therapy: Patient Spontanous Breathing  Post-op Pain: mild  Post-op Assessment: Post-op Vital signs reviewed, Patient's Cardiovascular Status Stable, Respiratory Function Stable, Patent Airway and No signs of Nausea or vomiting  Post-op Vital Signs: stable  Complications: No apparent anesthesia complications

## 2012-09-09 NOTE — H&P (Signed)
NATE PERRI is an 71 y.o. female.   Chief Complaint: Left shoulder pain and dysfunction after total shoulder replacement HPI: The patient is a 71 year old female had a cold shoulder replacement about one year ago. She fell out of bed postoperatively and has a subscapularis failure. This was treated nonsurgically but she went on to have continued pain and dysfunction the shoulder. She had extensive physical therapy, activity modification, medication, but unfortunately did not feel she could live with her shoulder is current state. Subscapularis and not be repairable at this point and therefore recommended treatment is revision to a reverse total shoulder replacement.  Past Medical History  Diagnosis Date  . Complication of anesthesia 08-25-12    prolong sedation with anesthesia  . Hypertension   . Hypercholesterolemia 08-25-12    hx. tx. meds  . GERD (gastroesophageal reflux disease) 08-25-12    tx. Prilosec  . Arthritis 08-25-12    osteoarthritis-all joints-left shoulder pain.  . Cataract 08-25-12    bilateral-not surgical yet  . Interstitial cystitis 08-25-12    Has implanted Interstim Left Flank-is functional with remote    Past Surgical History  Procedure Date  . Interstim implant placement 08-25-12    hx. -remains functional left flank  . Joint replacement 08-25-12    Bil. TKA/ LT shoulder replacement  . Back surgery 08-25-12    Lumbar fusion-retained hardware/cervical  fusion  . Abdominal hysterectomy 08-25-12    vaginal Hysterectomy  . Cholecystectomy 08-25-12    open  . Cervical disc surgery     History reviewed. No pertinent family history. Social History:  reports that she has never smoked. She does not have any smokeless tobacco history on file. She reports that she does not drink alcohol or use illicit drugs.  Allergies:  Allergies  Allergen Reactions  . Percocet (Oxycodone-Acetaminophen) Nausea And Vomiting and Other (See Comments)    hyperactivity    Medications  Prior to Admission  Medication Sig Dispense Refill  . acetaminophen (TYLENOL) 325 MG tablet Take 325-650 mg by mouth daily as needed. For headache/pain       . atenolol (TENORMIN) 100 MG tablet Take 100 mg by mouth daily before breakfast.       . HYDROcodone-acetaminophen (VICODIN) 5-500 MG per tablet Take 1-2 tablets by mouth every 6 (six) hours as needed. For pain       . latanoprost (XALATAN) 0.005 % ophthalmic solution Place 1 drop into both eyes at bedtime.      Marland Kitchen omeprazole (PRILOSEC) 20 MG capsule Take 20 mg by mouth daily.      Marland Kitchen PARoxetine (PAXIL) 40 MG tablet Take 40 mg by mouth daily before breakfast.       . pravastatin (PRAVACHOL) 40 MG tablet Take 40 mg by mouth daily before breakfast.       . promethazine-codeine (PHENERGAN WITH CODEINE) 6.25-10 MG/5ML syrup Take 5 mLs by mouth every 6 (six) hours as needed. Cough      . rOPINIRole (REQUIP) 4 MG tablet Take 4 mg by mouth at bedtime.        . triamterene-hydrochlorothiazide (DYAZIDE) 37.5-25 MG per capsule Take 1 capsule by mouth daily before breakfast.       . trospium (SANCTURA) 20 MG tablet Take 20 mg by mouth 2 (two) times daily.        Results for orders placed during the hospital encounter of 09/09/12 (from the past 48 hour(s))  ABO/RH     Status: Normal (Preliminary result)   Collection Time  09/09/12  9:00 AM      Component Value Range Comment   ABO/RH(D) PENDING     TYPE AND SCREEN     Status: Normal (Preliminary result)   Collection Time   09/09/12  9:10 AM      Component Value Range Comment   ABO/RH(D) A POS      Antibody Screen PENDING      Sample Expiration 09/12/2012      No results found.  Review of Systems  All other systems reviewed and are negative.    Blood pressure 126/74, pulse 56, temperature 98.3 F (36.8 C), temperature source Oral, resp. rate 16, SpO2 96.00%. Physical Exam  Constitutional: She is oriented to person, place, and time. She appears well-developed and well-nourished.  HENT:    Head: Atraumatic.  Eyes: EOM are normal.  Cardiovascular: Intact distal pulses.   Respiratory: Effort normal.  Musculoskeletal:       Left shoulder: She exhibits decreased range of motion, tenderness, pain and decreased strength. She exhibits no swelling.  Neurological: She is alert and oriented to person, place, and time.  Skin: Skin is warm and dry.  Psychiatric: She has a normal mood and affect.     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   Mable Paris 09/09/2012, 9:55 AM

## 2012-09-09 NOTE — Op Note (Signed)
Procedure(s): TOTAL SHOULDER REVISION REVERSE SHOULDER ARTHROPLASTY Procedure Note  [name] female 71 y.o. 09/09/2012  Procedure(s) and Anesthesia Type:        * LEFT SHOULDER REVISION FROM TOTAL SHOULDER TO REVERSE SHOULDER ARTHROPLASTY - General    Indications:  71 y.o. female  With failed left total shoulder arthroplasty who is 1 year out from her initial surgery. She had subscapularis failure postoperatively and subsequent continued pain and dysfunction in the shoulder. We tried to treat her conservatively with extensive PT and even to have her accept a less than optimal result, however she wanted to go forward with a revision surgery to lessen her pain and try to increase her function.     Surgeon: Mable Paris   Assistants: Damita Lack PA-C [facility] was present and scrubbed throughout the procedure and was essential in positioning, retraction, exposure, and closure)  Anesthesia: General endotracheal anesthesia with preoperative interscalene block   Procedure Detail  TOTAL SHOULDER REVISION, REVERSE SHOULDER ARTHROPLASTY   Estimated Blood Loss:  300 mL         Drains: 1 medium hemovac  Blood Given: none          Specimens: none        Complications:  * No complications entered in OR log *         Disposition: PACU - hemodynamically stable.         Condition: stable      OPERATIVE FINDINGS:  The previously placed press fit stem and anchor peg glenoid were noted both to be well fixed. There is no significant wear on the glenoid component. Components appear to be in good position however she was noted to have complete failure of the subscapularis. Supraspinatus and infraspinatus were largely intact. The supraspinatus was somewhat thinned anteriorly. A DePuy cemented reverse total shoulder arthroplasty was placed with a  size 10 stem, a 38 eccentric glenosphere, and a 9 mm metal spacer and 3-mm poly insert. The base plate  fixation was  excellent. Stability was excellent. I could comfortably bring her out to about 30 of external rotation and up to about her and 40 forward flexion. There was a fracture of the in metaphyseal bone proximally which was fixed to the implant using one 18-gauge wire.  PROCEDURE: The patient was identified in the preoperative holding area  where I personally marked the operative site after verifying site, side,  and procedure with the patient. An interscalene block given by  the attending anesthesiologist in the holding area and the patient was taken back to the operating room where all extremities were  carefully padded in position after general anesthesia was induced. She  was placed in a beach-chair position and the operative upper extremity was  prepped and draped in a standard sterile fashion. An approximately 12-  cm incision was made from the tip of the coracoid process to the center  point of the humerus at the level of the axilla in the previously marked out scar. Dissection was carried  down through subcutaneous tissues to the level of the cephalic vein  which was taken laterally with the deltoid. The pectoralis major was  retracted medially. There was a fair amount of scar tissue but the deltopectoral interval planes were identified and safely reopened. The subdeltoid space was developed and the lateral  edge of the conjoined tendon was identified. The undersurface of  conjoined tendon was palpated and the musculocutaneous nerve was not in  the field. Retractor was placed underneath  the conjoined and second  retractor was placed lateral into the deltoid. There is some soft tissue anterior joint but no tenderness tissue. The subscapularis was noted to be torn and completely retracted. The rotator interval was split the level of the glenoid. The biceps tendon was absent. The joint was then dislocated using a combination of abduction extension and external rotation. The implant was exposed. The  head was tapped off and removed. Cultures were taken and sent. The proximal stem was exposed with the rongeur and subsequently a pencil tip burr.  A quarter inch flexible osteotome was then used around the proximal stem in the section of the porous coating and then the extractor was used to successfully extract the implant ligament proximal bone intact. She was noted however to have significant thinning of the cortices proximally. There was no sign of overt infection. At this point distal reamers were used from 6-10 tan was felt to be the appropriate size. The #1 metaphyseal reamer was used proximally. Trial humeral component was placed.  The humeral head was then retracted posteriorly using a Fukuda retractor exposing the glenoid. Glenoid was inspected and found to be intact without evidence for loosening. No significant wear. Once adequate exposure was obtained a half inch curved osteotome was used to amputate the pegs on the glenoid. The glenoid was removed. Pegs were then removed. The central anchor peg glenoid required some loosening with a quarter-inch osteotome. It was then successfully removed. The guide and guide pin were then used for the reverse shoulder set. The reamer was used to ream down to concentric surface. The central hole was then drilled and the metaglen and was then impacted with a good press fit. The superior and inferior holes were then drilled and filled with appropriate-sized locking screws. Is also able to place a locking screw anteriorly with a nonlocking screw posteriorly. All screws had good to excellent fixation.  Proximal humerus was then again exposed. This it was noted that there was fracture of the anterior proximal metaphyseal bone. It did not extend distally. The gleno sphere was placed and impacted. A 38 eccentric sphere was used with offset inferior. The trial was then reduced with a 3 mm poly-component trial. The proximal metaphyseal bone was shortened slightly and the final  implant was then cemented in place using gentamicin loaded cement in 0 of retroversion. One 18-gauge wire was passed around the proximal metaphysis securing the fracture fragment in position.  The joint was then reduced with sequentially larger trial poly-components. It was finally determined that the 9 mm metal spacer with a 3 mm poly-component was the best fit with 2 finger reduction of dislocation. Soft tissue tension was appropriate. Therefore the final implants were placed. Final range of motion comfortably was about 30 external rotation about 140 forward flexion. There was no tendency towards dislocation.   A medium Hemovac drain was placed out underneath the deltoid prior to closure. The joint was then copiously irrigated with pulse  lavage and the wound was then closed. The subscapularis could not be repaired.  Skin was closed with 2-0 Vicryl in a deep dermal layer and 4-0  Monocryl for skin closure. Steri-Strips were applied.terile  dressings were then applied as well as a sling. The patient was allowed  to awaken from general anesthesia, transferred to stretcher, and taken  to recovery room in stable condition.   POSTOPERATIVE PLAN: The patient will be kept in the hospital one to 2 days postoperatively  for pain control and therapy.

## 2012-09-09 NOTE — Anesthesia Preprocedure Evaluation (Addendum)
Anesthesia Evaluation  Patient identified by MRN, date of birth, ID band Patient awake    Reviewed: Allergy & Precautions, H&P , NPO status , Patient's Chart, lab work & pertinent test results  History of Anesthesia Complications (+) PROLONGED EMERGENCE  Airway Mallampati: II TM Distance: >3 FB Neck ROM: Full    Dental No notable dental hx. (+) Edentulous Lower and Edentulous Upper   Pulmonary neg pulmonary ROS,  breath sounds clear to auscultation  Pulmonary exam normal       Cardiovascular hypertension, Pt. on medications negative cardio ROS  Rhythm:Regular Rate:Normal     Neuro/Psych negative neurological ROS  negative psych ROS   GI/Hepatic negative GI ROS, Neg liver ROS,   Endo/Other  negative endocrine ROS  Renal/GU negative Renal ROS  negative genitourinary   Musculoskeletal negative musculoskeletal ROS (+)   Abdominal   Peds negative pediatric ROS (+)  Hematology negative hematology ROS (+)   Anesthesia Other Findings   Reproductive/Obstetrics negative OB ROS                           Anesthesia Physical Anesthesia Plan  ASA: II  Anesthesia Plan: General   Post-op Pain Management:    Induction: Intravenous  Airway Management Planned: Oral ETT  Additional Equipment:   Intra-op Plan:   Post-operative Plan: Extubation in OR  Informed Consent: I have reviewed the patients History and Physical, chart, labs and discussed the procedure including the risks, benefits and alternatives for the proposed anesthesia with the patient or authorized representative who has indicated his/her understanding and acceptance.   Dental advisory given  Plan Discussed with: CRNA  Anesthesia Plan Comments: (SCB)        Anesthesia Quick Evaluation

## 2012-09-09 NOTE — Anesthesia Procedure Notes (Signed)
Anesthesia Regional Block:  Supraclavicular block  Pre-Anesthetic Checklist: ,, timeout performed, Correct Patient, Correct Site, Correct Laterality, Correct Procedure, Correct Position, site marked, Risks and benefits discussed,  Surgical consent,  Pre-op evaluation,  At surgeon's request and post-op pain management  Laterality: Upper and Left  Prep: chloraprep       Needles:  Injection technique: Single-shot  Needle Type: Stimiplex     Needle Length: 10cm 10 cm Needle Gauge: 21 G    Additional Needles:  Procedures: ultrasound guided and nerve stimulator Supraclavicular block Narrative:  Injection made incrementally with aspirations every 5 mL.  Performed by: Personally  Anesthesiologist: Sincere Liuzzi MD  Additional Notes: Risks, benefits and alternative to block explained extensively.  Patient tolerated procedure well, without complications.  Supraclavicular block   

## 2012-09-09 NOTE — Preoperative (Signed)
Beta Blockers   Reason not to administer Beta Blockers:Not Applicable 

## 2012-09-10 ENCOUNTER — Encounter (HOSPITAL_COMMUNITY): Payer: Self-pay | Admitting: Orthopedic Surgery

## 2012-09-10 LAB — CBC
MCH: 31.4 pg (ref 26.0–34.0)
Platelets: 108 10*3/uL — ABNORMAL LOW (ref 150–400)
RBC: 3.57 MIL/uL — ABNORMAL LOW (ref 3.87–5.11)
RDW: 13.2 % (ref 11.5–15.5)
WBC: 6.8 10*3/uL (ref 4.0–10.5)

## 2012-09-10 LAB — BASIC METABOLIC PANEL
CO2: 31 mEq/L (ref 19–32)
Calcium: 8.2 mg/dL — ABNORMAL LOW (ref 8.4–10.5)
GFR calc Af Amer: >90 mL/min (ref >90)
Sodium: 138 mEq/L (ref 135–145)

## 2012-09-10 MED ORDER — MORPHINE SULFATE 2 MG/ML IJ SOLN
1.0000 mg | Freq: Once | INTRAMUSCULAR | Status: AC
  Administered 2012-09-10: 1 mg via INTRAVENOUS
  Filled 2012-09-10: qty 1

## 2012-09-10 MED ORDER — BLISTEX EX OINT
TOPICAL_OINTMENT | CUTANEOUS | Status: AC
  Administered 2012-09-10: 02:00:00
  Filled 2012-09-10: qty 10

## 2012-09-10 NOTE — Evaluation (Signed)
Occupational Therapy Evaluation Patient Details Name: [name] MRN: [id number] DOB: 08/28/1941 Today's Date: 09/10/2012 Time: 4098-1191 OT Time Calculation (min): 32 min  OT Assessment / Plan / Recommendation Clinical Impression  Pt is currently limited by increased pain and fatigue. Assisted pt up toilet before starting UE education and pt was min assist for balance HHA. Husband present and is familiar with techniques from last surgery. Reviewed handout but pt and spouse could benefit from mroe education/hands on pratice. Pt needs constant verbal cues to NOT move L shoulder.!! Nursing reports pt not d/c today so will see pt for session in am.    OT Assessment  Patient needs continued OT Services    Follow Up Recommendations  No OT follow up;Supervision/Assistance - 24 hour    Barriers to Discharge      Equipment Recommendations  None recommended by OT    Recommendations for Other Services    Frequency  Min 2X/week    Precautions / Restrictions Precautions Precautions: Shoulder Type of Shoulder Precautions: L shoulder immobilizer with abduction pillow Precaution Booklet Issued: Yes (comment) Precaution Comments: Issued shoulder care handout and reviewed with pt and spouse. Required Braces or Orthoses: Other Brace/Splint Other Brace/Splint: L shoulder immobilizer Restrictions LUE Weight Bearing: Non weight bearing        ADL       OT Diagnosis: Generalized weakness;Acute pain  OT Problem List: Decreased strength;Decreased range of motion;Decreased activity tolerance;Pain;Decreased knowledge of use of DME or AE OT Treatment Interventions: Self-care/ADL training;Therapeutic activities;Therapeutic exercise;DME and/or AE instruction;Patient/family education   OT Goals Acute Rehab OT Goals OT Goal Formulation: With patient/family Time For Goal Achievement: 09/17/12 Potential to Achieve Goals: Good ADL Goals Pt Will Perform Upper Body Bathing: Other  (comment);Unsupported (cargiver will independently perform UE bath with precautions) ADL Goal: Upper Body Bathing - Progress: Goal set today Pt Will Perform Upper Body Dressing: Unsupported;Other (comment) (caregiver will independently perform including sling) ADL Goal: Upper Body Dressing - Progress: Goal set today Additional ADL Goal #1: Pt will transfer on/off 3in1 with supervision. ADL Goal: Additional Goal #1 - Progress: Goal set today Additional ADL Goal #2: Pt/spouse will verbalize understanding of all education shoulder care handout. ADL Goal: Additional Goal #2 - Progress: Goal set today Arm Goals Additional Arm Goal #1: pt/spouse will independently perform X 10 reps elbow, wrist and hand ROM. Arm Goal: Additional Goal #1 - Progress: Goal set today  Visit Information  Last OT Received On: 09/10/12 Assistance Needed: +1    Subjective Data  Subjective: I need to go to the bathroom Patient Stated Goal: as above   Prior Functioning     Home Living Lives With: Spouse Available Help at Discharge: Family Home Adaptive Equipment: Bedside commode/3-in-1 Communication Communication: No difficulties Dominant Hand: Right         Vision/Perception     Cognition       Extremity/Trunk Assessment       Mobility       Shoulder Instructions Donning/doffing shirt without moving shoulder:  (OT assisted with new gown. Husband hasnt practiced yet) Method for sponge bathing under operated UE:  (caregiver verbalizes understanding.) Donning/doffing sling/immobilizer:  (caregiver requires min asisst to complete) Correct positioning of sling/immobilizer:  (min verbal cues for caregiver.) ROM for elbow, wrist and digits of operated UE: Minimal assistance Sling wearing schedule (on at all times/off for ADL's): Caregiver independent with task (caregiver verbalizes understanding.) Proper positioning of operated UE when showering: Caregiver independent with task (caregiver  verbalizes) Positioning of  UE while sleeping: Caregiver independent with task (caregiver verbalizes)   Exercise Other Exercises Other Exercises: Pt performed 10 reps of digit and wrist flexion and extension. Placed pillow between  pt's body and L UE for pt to perform elbow exercises in standing. Pt needs CONSTANT verbal cues to not move L shoulder! Husband also cueing pt to not move L UE. Pt performed  6-7 reps of this but then fatigued and had to sit back down.   Balance     End of Session OT - End of Session Activity Tolerance: Patient limited by fatigue;Patient limited by pain Patient left: in bed;with call bell/phone within reach;with family/visitor present  GO     [name] [phone] 09/10/2012, 1:14 PM

## 2012-09-10 NOTE — Progress Notes (Signed)
PATIENT ID: [name]   1 Day Post-Op Procedure(s) (LRB): TOTAL SHOULDER REVISION (Left) REVERSE SHOULDER ARTHROPLASTY (Left)  Subjective: Feeling well today. Up in chair. Some soreness and pain in left shoulder but overall pain is well controlled. Has not yet worked with OT.   Objective:  Filed Vitals:   09/10/12 0514  BP: 119/63  Pulse: 72  Temp: 98.4 F (36.9 C)  Resp: 16     A&O, some difficulty hearing L UE dressing c/d/i, drain removed today Wiggles fingers, intact sensation to light touch Fires deltoid adequately  Labs:   [facility] ([facility]) 09/10/12 0347  HGB 11.2*   Basename 09/10/12 0347  WBC 6.8  RBC 3.57*  HCT 33.6*  PLT 108*   Basename 09/10/12 0347  NA 138  K 3.9  CL 102  CO2 31  BUN 9  CREATININE 0.72  GLUCOSE 119*  CALCIUM 8.2*    Assessment and Plan: 1 day s/p Left total shoulder revision, left reverse total shoulder arthroplasty Overall feeling well today, will keep one more day to observe and follow labs Continue current pain mgmt Will work with OT today Plan on d/c home tomorrow  VTE proph: SCDs , ASA 325mg  BID

## 2012-09-11 DIAGNOSIS — M25512 Pain in left shoulder: Secondary | ICD-10-CM | POA: Diagnosis present

## 2012-09-11 DIAGNOSIS — G8929 Other chronic pain: Secondary | ICD-10-CM | POA: Diagnosis present

## 2012-09-11 LAB — BASIC METABOLIC PANEL
CO2: 31 mEq/L (ref 19–32)
Chloride: 97 mEq/L (ref 96–112)
Creatinine, Ser: 0.79 mg/dL (ref 0.50–1.10)
GFR calc Af Amer: >90 mL/min (ref >90)
Potassium: 3.6 mEq/L (ref 3.5–5.1)
Sodium: 136 mEq/L (ref 135–145)

## 2012-09-11 LAB — CBC
MCV: 93.8 fL (ref 78.0–100.0)
Platelets: 119 10*3/uL — ABNORMAL LOW (ref 150–400)
RBC: 3.53 MIL/uL — ABNORMAL LOW (ref 3.87–5.11)
WBC: 7.5 10*3/uL (ref 4.0–10.5)

## 2012-09-11 MED ORDER — HYDROCODONE-ACETAMINOPHEN 7.5-325 MG PO TABS: ORAL_TABLET | ORAL | Status: DC

## 2012-09-11 NOTE — Progress Notes (Signed)
Pt stable, d/c instructions and scripts given with no questions voiced.  Pt transferred via wheelchair to private vehicle with NT and husband.

## 2012-09-11 NOTE — Discharge Summary (Signed)
Patient ID: [name] MRN: [id number] DOB/AGE: 1941/05/29 71 y.o.  Admit date: 09/09/2012 Discharge date: 09/11/2012  Admission Diagnoses:  Principal Problem:  *Left shoulder pain   Discharge Diagnoses:  S/P TOTAL SHOULDER REVISION, REVERSE TOTAL SHOULDER ARTHROPLASTY  Past Medical History  Diagnosis Date  . Complication of anesthesia 08-25-12    prolong sedation with anesthesia  . Hypertension   . Hypercholesterolemia 08-25-12    hx. tx. meds  . GERD (gastroesophageal reflux disease) 08-25-12    tx. Prilosec  . Arthritis 08-25-12    osteoarthritis-all joints-left shoulder pain.  . Cataract 08-25-12    bilateral-not surgical yet  . Interstitial cystitis 08-25-12    Has implanted Interstim Left Flank-is functional with remote    Surgeries: Procedure(s): TOTAL SHOULDER REVISION REVERSE SHOULDER ARTHROPLASTY on 09/09/2012   Consultants:    Discharged Condition: Improved  Hospital Course: [name] is an 71 y.o. female who was admitted 09/09/2012 for operative treatment ofLeft shoulder pain after a total shoulder arthroplasty with failed subscapularis repair. Patient has severe unremitting pain that affects sleep, daily activities, and work/hobbies. After pre-op clearance the patient was taken to the operating room on 09/09/2012 and underwent  Procedure(s): TOTAL SHOULDER REVISION REVERSE SHOULDER ARTHROPLASTY.    Patient was given perioperative antibiotics: Anti-infectives     Start     Dose/Rate Route Frequency Ordered Stop   09/09/12 1800   ceFAZolin (ANCEF) IVPB 1 g/50 mL premix        1 g 100 mL/hr over 30 Minutes Intravenous Every 6 hours 09/09/12 1625 2012/09/16 0847   09/09/12 0753   ceFAZolin (ANCEF) IVPB 2 g/50 mL premix        2 g 100 mL/hr over 30 Minutes Intravenous 60 min pre-op 09/09/12 0753 09/09/12 1212           Patient was given sequential compression devices, early ambulation, and ASA 325mg  BID to prevent DVT.  Patient benefited maximally  from hospital stay and there were no complications.    Recent vital signs: Patient Vitals for the past 24 hrs:  BP Temp Temp src Pulse Resp SpO2  09/11/12 0517 108/68 mmHg 98.8 F (37.1 C) Oral 64  16  93 %  2012-09-16 2005 104/57 mmHg 98.7 F (37.1 C) - 68  18  93 %  09/16/12 1405 135/80 mmHg 98.3 F (36.8 C) Oral 65  20  95 %  09-16-12 1030 119/73 mmHg 97.8 F (36.6 C) Oral 97  - 90 %     Recent laboratory studies:  Basename 09/11/12 0435 09-16-12 0347  WBC 7.5 6.8  HGB 11.1* 11.2*  HCT 33.1* 33.6*  PLT 119* 108*  NA 136 138  K 3.6 3.9  CL 97 102  CO2 31 31  BUN 10 9  CREATININE 0.79 0.72  GLUCOSE 124* 119*  INR -- --  CALCIUM 8.3* --     Discharge Medications:     Medication List     As of 09/11/2012  9:32 AM    STOP taking these medications         acetaminophen 325 MG tablet   Commonly known as: TYLENOL      HYDROcodone-acetaminophen 5-500 MG per tablet   Commonly known as: VICODIN      TAKE these medications         atenolol 100 MG tablet   Commonly known as: TENORMIN   Take 100 mg by mouth daily before breakfast.      HYDROcodone-acetaminophen 7.5-325 MG per tablet  Commonly known as: NORCO   1-2 q 4-6 hours prn pain      latanoprost 0.005 % ophthalmic solution   Commonly known as: XALATAN   Place 1 drop into both eyes at bedtime.      omeprazole 20 MG capsule   Commonly known as: PRILOSEC   Take 20 mg by mouth daily.      PARoxetine 40 MG tablet   Commonly known as: PAXIL   Take 40 mg by mouth daily before breakfast.      pravastatin 40 MG tablet   Commonly known as: PRAVACHOL   Take 40 mg by mouth daily before breakfast.      promethazine-codeine 6.25-10 MG/5ML syrup   Commonly known as: PHENERGAN with CODEINE   Take 5 mLs by mouth every 6 (six) hours as needed. Cough      rOPINIRole 4 MG tablet   Commonly known as: REQUIP   Take 4 mg by mouth at bedtime.      triamterene-hydrochlorothiazide 37.5-25 MG per capsule   Commonly  known as: DYAZIDE   Take 1 capsule by mouth daily before breakfast.      trospium 20 MG tablet   Commonly known as: SANCTURA   Take 20 mg by mouth 2 (two) times daily.        Diagnostic Studies: Dg Chest 2 View  08/25/2012  *RADIOLOGY REPORT*  Clinical Data: Preop, no chest complaints.  CHEST - 2 VIEW  Comparison:  02/12/2011.  Findings:  Cardiomegaly with calcified tortuous aorta.  Clear lung fields.  Osteopenia.  Previous left shoulder replacement.  Pedicle screw and Ray cage lumbar fusion.  Previous cervical fusion. Previous right shoulder repair.  Similar appearance to priors except for recent left shoulder replacement.  IMPRESSION: Cardiomegaly.  No active infiltrates or failure.   Original Report Authenticated By: Elsie Stain, M.D.    Dg Shoulder Left  09/09/2012  *RADIOLOGY REPORT*  Clinical Data: Left total shoulder arthroplasty.  LEFT SHOULDER - 2+ VIEW  Comparison: 09/23/2011.  Findings: Interval revision of the left shoulder prosthesis.  The upper aspect is cemented and a cerclage wires noted.  There is a small cortical fracture noted along the lateral margin of the upper humeral shaft.  IMPRESSION: Well seated components of a total shoulder arthroplasty revision. Suspect small cortical fracture along the lateral margin of the upper humeral shaft.   Original Report Authenticated By: P. Loralie Champagne, M.D.     Disposition: 06-Home-Health Care Svc      Discharge Orders    Future Orders Please Complete By Expires   Diet - low sodium heart healthy      Call MD / Call 911      Comments:   If you experience chest pain or shortness of breath, CALL 911 and be transported to the hospital emergency room.  If you develope a fever above 101 F, pus (white drainage) or increased drainage or redness at the wound, or calf pain, call your surgeon's office.   Constipation Prevention      Comments:   Drink plenty of fluids.  Prune juice may be helpful.  You may use a stool softener, such as  Colace (over the counter) 100 mg twice a day.  Use MiraLax (over the counter) for constipation as needed.   Increase activity slowly as tolerated      Driving restrictions      Comments:   No driving until cleared by physician.   Lifting restrictions  Comments:   No lifting with left arm until cleared by physician.      Follow-up Information    Follow up with Mable Paris, MD. Schedule an appointment as soon as possible for a visit in 2 weeks.   Contact information:   [facility] [address] [place] [address] [phone]           Signed: Jiles Harold 09/11/2012, 9:32 AM

## 2012-09-11 NOTE — Progress Notes (Signed)
Occupational Therapy Treatment Patient Details Name: [name] MRN: [id number] DOB: 1941/04/11 Today's Date: 09/11/2012 Time: 2956-2130 OT Time Calculation (min): 28 min  OT Assessment / Plan / Recommendation Comments on Treatment Session Pt has met all goals as stated on eval. Pt ready for d/c home with assistance from husband.    Follow Up Recommendations  No OT follow up;Supervision/Assistance - 24 hour    Barriers to Discharge       Equipment Recommendations  None recommended by OT    Recommendations for Other Services    Frequency     Plan All goals met and education completed, patient discharged from OT services    Precautions / Restrictions Precautions Precautions: Shoulder Required Braces or Orthoses: Other Brace/Splint Other Brace/Splint: L shoulder immobilizer with abduction pillow.   Pertinent Vitals/Pain Pt reported 7/10 pain in L shoulder. Repositioned for comfort.    ADL  Grooming: Performed;Wash/dry hands;Supervision/safety Where Assessed - Grooming: Unsupported standing Toilet Transfer: Research scientist (life sciences) Method: Sit to Barista: Comfort height toilet Toileting - Clothing Manipulation and Hygiene: Performed;Min guard Where Assessed - Engineer, mining and Hygiene: Sit to stand from 3-in-1 or toilet Equipment Used: Rolling walker Transfers/Ambulation Related to ADLs: Pt ambulated with bathroom with HHA. Recommended to pt/husband for pt to use cane at home.    OT Diagnosis:    OT Problem List:   OT Treatment Interventions:     OT Goals ADL Goals ADL Goal: Upper Body Bathing - Progress: Met ADL Goal: Upper Body Dressing - Progress: Met ADL Goal: Additional Goal #1 - Progress: Met ADL Goal: Additional Goal #2 - Progress: Met Arm Goals Arm Goal: Additional Goal #1 - Progress: Met  Visit Information  Last OT Received On: 09/11/12 Assistance Needed: +1    Subjective Data  Subjective: This is the 2nd time Ive had surgery on this shoulder.   Prior Functioning       Cognition  Overall Cognitive Status: Appears within functional limits for tasks assessed/performed Arousal/Alertness: Awake/alert Orientation Level: Appears intact for tasks assessed Behavior During Session: [facility] for tasks performed    Mobility  Shoulder Instructions Bed Mobility Bed Mobility: Supine to Sit Supine to Sit: 5: Supervision;HOB elevated;With rails Transfers Transfers: Sit to Stand;Stand to Sit Sit to Stand: 4: Min guard;With upper extremity assist;From bed;From toilet Stand to Sit: 4: Min guard;With upper extremity assist;To chair/3-in-1;To toilet;With armrests Details for Transfer Assistance: min cues for hand placement.   Donning/doffing shirt without moving shoulder:  (Verbally instructed husband. No shirt available.) Method for sponge bathing under operated UE: Caregiver independent with task Donning/doffing sling/immobilizer: Caregiver independent with task Correct positioning of sling/immobilizer: Caregiver independent with task ROM for elbow, wrist and digits of operated UE: Caregiver independent with task Sling wearing schedule (on at all times/off for ADL's): Caregiver independent with task Proper positioning of operated UE when showering: Caregiver independent with task Positioning of UE while sleeping: Caregiver independent with task   Exercises  Shoulder Exercises Elbow Flexion: AAROM;Left;10 reps;Standing   Balance     End of Session OT - End of Session Activity Tolerance: Patient tolerated treatment well Patient left: in chair;with call bell/phone within reach;with family/visitor present  GO     [name] A OTR/L [phone] 09/11/2012, 12:07 PM

## 2012-09-11 NOTE — Progress Notes (Signed)
PATIENT ID: [name]   2 Days Post-Op Procedure(s) (LRB): TOTAL SHOULDER REVISION (Left) REVERSE SHOULDER ARTHROPLASTY (Left)  Subjective: Feeling good today. Pain is under control. Denies nausea, vomiting, lightheadedness, dizziness. OT reports patient needs one more session. Ready to go home after OT today.   Objective:  Filed Vitals:   09/11/12 0517  BP: 108/68  Pulse: 64  Temp: 98.8 F (37.1 C)  Resp: 16     A&O,  Dressings removed, steris intact Incision looks benign. Some swelling of L UE, no erythema, warmth Wiggles fingers, intact sensation to light touch, r/m/u nerves working   Labs:   [facility] 09/11/12 0435 09/10/12 0347  HGB 11.1* 11.2*   Basename 09/11/12 0435 09/10/12 0347  WBC 7.5 6.8  RBC 3.53* 3.57*  HCT 33.1* 33.6*  PLT 119* 108*   Basename 09/11/12 0435 09/10/12 0347  NA 136 138  K 3.6 3.9  CL 97 102  CO2 31 31  BUN 10 9  CREATININE 0.79 0.72  GLUCOSE 124* 119*  CALCIUM 8.3* 8.2*    Assessment and Plan: D/c home today after OT Home with norco for pain control Fu 2 weeks with Dr.Chanlder   VTE proph: ASA 325mg  BID, SCDs

## 2012-09-13 LAB — BODY FLUID CULTURE
Culture: NO GROWTH
Gram Stain: NONE SEEN

## 2012-09-22 DIAGNOSIS — T84099A Other mechanical complication of unspecified internal joint prosthesis, initial encounter: Secondary | ICD-10-CM | POA: Diagnosis not present

## 2012-10-05 DIAGNOSIS — Z6834 Body mass index (BMI) 34.0-34.9, adult: Secondary | ICD-10-CM | POA: Diagnosis not present

## 2012-10-05 DIAGNOSIS — K219 Gastro-esophageal reflux disease without esophagitis: Secondary | ICD-10-CM | POA: Diagnosis not present

## 2012-10-05 DIAGNOSIS — G2589 Other specified extrapyramidal and movement disorders: Secondary | ICD-10-CM | POA: Diagnosis not present

## 2012-10-05 DIAGNOSIS — I10 Essential (primary) hypertension: Secondary | ICD-10-CM | POA: Diagnosis not present

## 2012-10-05 DIAGNOSIS — E7800 Pure hypercholesterolemia, unspecified: Secondary | ICD-10-CM | POA: Diagnosis not present

## 2012-10-08 DIAGNOSIS — N3941 Urge incontinence: Secondary | ICD-10-CM | POA: Diagnosis not present

## 2012-10-08 DIAGNOSIS — R3915 Urgency of urination: Secondary | ICD-10-CM | POA: Diagnosis not present

## 2012-10-18 DIAGNOSIS — T84099A Other mechanical complication of unspecified internal joint prosthesis, initial encounter: Secondary | ICD-10-CM | POA: Diagnosis not present

## 2012-10-19 DIAGNOSIS — H4011X0 Primary open-angle glaucoma, stage unspecified: Secondary | ICD-10-CM | POA: Diagnosis not present

## 2012-10-20 DIAGNOSIS — N3941 Urge incontinence: Secondary | ICD-10-CM | POA: Diagnosis not present

## 2012-11-04 DIAGNOSIS — G2589 Other specified extrapyramidal and movement disorders: Secondary | ICD-10-CM | POA: Diagnosis not present

## 2012-11-04 DIAGNOSIS — K219 Gastro-esophageal reflux disease without esophagitis: Secondary | ICD-10-CM | POA: Diagnosis not present

## 2012-11-04 DIAGNOSIS — E7800 Pure hypercholesterolemia, unspecified: Secondary | ICD-10-CM | POA: Diagnosis not present

## 2012-11-04 DIAGNOSIS — Z6834 Body mass index (BMI) 34.0-34.9, adult: Secondary | ICD-10-CM | POA: Diagnosis not present

## 2012-11-22 DIAGNOSIS — T84099A Other mechanical complication of unspecified internal joint prosthesis, initial encounter: Secondary | ICD-10-CM | POA: Diagnosis not present

## 2012-12-09 DIAGNOSIS — K219 Gastro-esophageal reflux disease without esophagitis: Secondary | ICD-10-CM | POA: Diagnosis not present

## 2012-12-09 DIAGNOSIS — G2589 Other specified extrapyramidal and movement disorders: Secondary | ICD-10-CM | POA: Diagnosis not present

## 2012-12-09 DIAGNOSIS — D649 Anemia, unspecified: Secondary | ICD-10-CM | POA: Diagnosis not present

## 2012-12-09 DIAGNOSIS — Z9181 History of falling: Secondary | ICD-10-CM | POA: Diagnosis not present

## 2012-12-09 DIAGNOSIS — Z1331 Encounter for screening for depression: Secondary | ICD-10-CM | POA: Diagnosis not present

## 2012-12-09 DIAGNOSIS — Z6834 Body mass index (BMI) 34.0-34.9, adult: Secondary | ICD-10-CM | POA: Diagnosis not present

## 2012-12-09 DIAGNOSIS — E7800 Pure hypercholesterolemia, unspecified: Secondary | ICD-10-CM | POA: Diagnosis not present

## 2012-12-29 DIAGNOSIS — K219 Gastro-esophageal reflux disease without esophagitis: Secondary | ICD-10-CM | POA: Diagnosis not present

## 2012-12-30 DIAGNOSIS — K5730 Diverticulosis of large intestine without perforation or abscess without bleeding: Secondary | ICD-10-CM | POA: Diagnosis not present

## 2012-12-30 DIAGNOSIS — Z79899 Other long term (current) drug therapy: Secondary | ICD-10-CM | POA: Diagnosis not present

## 2012-12-30 DIAGNOSIS — Z1211 Encounter for screening for malignant neoplasm of colon: Secondary | ICD-10-CM | POA: Diagnosis not present

## 2012-12-30 DIAGNOSIS — K219 Gastro-esophageal reflux disease without esophagitis: Secondary | ICD-10-CM | POA: Diagnosis not present

## 2012-12-30 DIAGNOSIS — E7800 Pure hypercholesterolemia, unspecified: Secondary | ICD-10-CM | POA: Diagnosis not present

## 2012-12-30 DIAGNOSIS — I10 Essential (primary) hypertension: Secondary | ICD-10-CM | POA: Diagnosis not present

## 2012-12-30 DIAGNOSIS — D126 Benign neoplasm of colon, unspecified: Secondary | ICD-10-CM | POA: Diagnosis not present

## 2013-01-20 DIAGNOSIS — H4011X0 Primary open-angle glaucoma, stage unspecified: Secondary | ICD-10-CM | POA: Diagnosis not present

## 2013-02-07 DIAGNOSIS — E8881 Metabolic syndrome: Secondary | ICD-10-CM | POA: Diagnosis not present

## 2013-02-07 DIAGNOSIS — K219 Gastro-esophageal reflux disease without esophagitis: Secondary | ICD-10-CM | POA: Diagnosis not present

## 2013-02-16 DIAGNOSIS — Z13820 Encounter for screening for osteoporosis: Secondary | ICD-10-CM | POA: Diagnosis not present

## 2013-02-16 DIAGNOSIS — M899 Disorder of bone, unspecified: Secondary | ICD-10-CM | POA: Diagnosis not present

## 2013-02-16 DIAGNOSIS — M949 Disorder of cartilage, unspecified: Secondary | ICD-10-CM | POA: Diagnosis not present

## 2013-04-11 DIAGNOSIS — M899 Disorder of bone, unspecified: Secondary | ICD-10-CM | POA: Diagnosis not present

## 2013-04-11 DIAGNOSIS — D649 Anemia, unspecified: Secondary | ICD-10-CM | POA: Diagnosis not present

## 2013-04-11 DIAGNOSIS — G2589 Other specified extrapyramidal and movement disorders: Secondary | ICD-10-CM | POA: Diagnosis not present

## 2013-04-11 DIAGNOSIS — J209 Acute bronchitis, unspecified: Secondary | ICD-10-CM | POA: Diagnosis not present

## 2013-04-11 DIAGNOSIS — E7800 Pure hypercholesterolemia, unspecified: Secondary | ICD-10-CM | POA: Diagnosis not present

## 2013-04-19 DIAGNOSIS — H4011X0 Primary open-angle glaucoma, stage unspecified: Secondary | ICD-10-CM | POA: Diagnosis not present

## 2013-05-11 DIAGNOSIS — T84099A Other mechanical complication of unspecified internal joint prosthesis, initial encounter: Secondary | ICD-10-CM | POA: Diagnosis not present

## 2013-05-20 DIAGNOSIS — R609 Edema, unspecified: Secondary | ICD-10-CM | POA: Diagnosis not present

## 2013-05-20 DIAGNOSIS — L0390 Cellulitis, unspecified: Secondary | ICD-10-CM | POA: Diagnosis not present

## 2013-05-20 DIAGNOSIS — L0291 Cutaneous abscess, unspecified: Secondary | ICD-10-CM | POA: Diagnosis not present

## 2013-05-20 DIAGNOSIS — Z6828 Body mass index (BMI) 28.0-28.9, adult: Secondary | ICD-10-CM | POA: Diagnosis not present

## 2013-05-20 DIAGNOSIS — G2589 Other specified extrapyramidal and movement disorders: Secondary | ICD-10-CM | POA: Diagnosis not present

## 2013-06-09 DIAGNOSIS — K219 Gastro-esophageal reflux disease without esophagitis: Secondary | ICD-10-CM | POA: Diagnosis not present

## 2013-06-09 DIAGNOSIS — Z6834 Body mass index (BMI) 34.0-34.9, adult: Secondary | ICD-10-CM | POA: Diagnosis not present

## 2013-06-09 DIAGNOSIS — M899 Disorder of bone, unspecified: Secondary | ICD-10-CM | POA: Diagnosis not present

## 2013-06-09 DIAGNOSIS — I10 Essential (primary) hypertension: Secondary | ICD-10-CM | POA: Diagnosis not present

## 2013-06-09 DIAGNOSIS — E7800 Pure hypercholesterolemia, unspecified: Secondary | ICD-10-CM | POA: Diagnosis not present

## 2013-06-09 DIAGNOSIS — M949 Disorder of cartilage, unspecified: Secondary | ICD-10-CM | POA: Diagnosis not present

## 2013-06-09 DIAGNOSIS — G2589 Other specified extrapyramidal and movement disorders: Secondary | ICD-10-CM | POA: Diagnosis not present

## 2013-06-23 DIAGNOSIS — L0390 Cellulitis, unspecified: Secondary | ICD-10-CM | POA: Diagnosis not present

## 2013-06-23 DIAGNOSIS — L0291 Cutaneous abscess, unspecified: Secondary | ICD-10-CM | POA: Diagnosis not present

## 2013-06-23 DIAGNOSIS — K219 Gastro-esophageal reflux disease without esophagitis: Secondary | ICD-10-CM | POA: Diagnosis not present

## 2013-06-23 DIAGNOSIS — Z6834 Body mass index (BMI) 34.0-34.9, adult: Secondary | ICD-10-CM | POA: Diagnosis not present

## 2013-06-23 DIAGNOSIS — G2589 Other specified extrapyramidal and movement disorders: Secondary | ICD-10-CM | POA: Diagnosis not present

## 2013-06-29 DIAGNOSIS — G2589 Other specified extrapyramidal and movement disorders: Secondary | ICD-10-CM | POA: Diagnosis not present

## 2013-06-29 DIAGNOSIS — L0390 Cellulitis, unspecified: Secondary | ICD-10-CM | POA: Diagnosis not present

## 2013-06-29 DIAGNOSIS — L0291 Cutaneous abscess, unspecified: Secondary | ICD-10-CM | POA: Diagnosis not present

## 2013-06-29 DIAGNOSIS — Z6834 Body mass index (BMI) 34.0-34.9, adult: Secondary | ICD-10-CM | POA: Diagnosis not present

## 2013-06-29 DIAGNOSIS — E8881 Metabolic syndrome: Secondary | ICD-10-CM | POA: Diagnosis not present

## 2013-07-06 DIAGNOSIS — T84099A Other mechanical complication of unspecified internal joint prosthesis, initial encounter: Secondary | ICD-10-CM | POA: Diagnosis not present

## 2013-07-12 DIAGNOSIS — I8310 Varicose veins of unspecified lower extremity with inflammation: Secondary | ICD-10-CM | POA: Diagnosis not present

## 2013-07-12 DIAGNOSIS — L820 Inflamed seborrheic keratosis: Secondary | ICD-10-CM | POA: Diagnosis not present

## 2013-07-12 DIAGNOSIS — L981 Factitial dermatitis: Secondary | ICD-10-CM | POA: Diagnosis not present

## 2013-07-18 DIAGNOSIS — H4011X0 Primary open-angle glaucoma, stage unspecified: Secondary | ICD-10-CM | POA: Diagnosis not present

## 2013-07-27 DIAGNOSIS — D649 Anemia, unspecified: Secondary | ICD-10-CM | POA: Diagnosis not present

## 2013-07-27 DIAGNOSIS — Z6834 Body mass index (BMI) 34.0-34.9, adult: Secondary | ICD-10-CM | POA: Diagnosis not present

## 2013-07-27 DIAGNOSIS — E8881 Metabolic syndrome: Secondary | ICD-10-CM | POA: Diagnosis not present

## 2013-07-27 DIAGNOSIS — G2589 Other specified extrapyramidal and movement disorders: Secondary | ICD-10-CM | POA: Diagnosis not present

## 2013-07-27 DIAGNOSIS — E7800 Pure hypercholesterolemia, unspecified: Secondary | ICD-10-CM | POA: Diagnosis not present

## 2013-08-03 DIAGNOSIS — N3941 Urge incontinence: Secondary | ICD-10-CM | POA: Diagnosis not present

## 2013-08-18 DIAGNOSIS — N390 Urinary tract infection, site not specified: Secondary | ICD-10-CM | POA: Diagnosis not present

## 2013-08-18 DIAGNOSIS — G2589 Other specified extrapyramidal and movement disorders: Secondary | ICD-10-CM | POA: Diagnosis not present

## 2013-08-18 DIAGNOSIS — E8881 Metabolic syndrome: Secondary | ICD-10-CM | POA: Diagnosis not present

## 2013-08-18 DIAGNOSIS — Z6834 Body mass index (BMI) 34.0-34.9, adult: Secondary | ICD-10-CM | POA: Diagnosis not present

## 2013-08-31 DIAGNOSIS — Z86010 Personal history of colonic polyps: Secondary | ICD-10-CM | POA: Diagnosis not present

## 2013-08-31 DIAGNOSIS — Z800 Family history of malignant neoplasm of digestive organs: Secondary | ICD-10-CM | POA: Diagnosis not present

## 2013-08-31 DIAGNOSIS — K5730 Diverticulosis of large intestine without perforation or abscess without bleeding: Secondary | ICD-10-CM | POA: Diagnosis not present

## 2013-08-31 DIAGNOSIS — R1310 Dysphagia, unspecified: Secondary | ICD-10-CM | POA: Diagnosis not present

## 2013-09-02 DIAGNOSIS — Z23 Encounter for immunization: Secondary | ICD-10-CM | POA: Diagnosis not present

## 2013-09-06 DIAGNOSIS — K2960 Other gastritis without bleeding: Secondary | ICD-10-CM | POA: Diagnosis not present

## 2013-09-06 DIAGNOSIS — K219 Gastro-esophageal reflux disease without esophagitis: Secondary | ICD-10-CM | POA: Diagnosis not present

## 2013-09-06 DIAGNOSIS — R1310 Dysphagia, unspecified: Secondary | ICD-10-CM | POA: Diagnosis not present

## 2013-09-06 DIAGNOSIS — I10 Essential (primary) hypertension: Secondary | ICD-10-CM | POA: Diagnosis not present

## 2013-09-06 DIAGNOSIS — F329 Major depressive disorder, single episode, unspecified: Secondary | ICD-10-CM | POA: Diagnosis not present

## 2013-09-06 DIAGNOSIS — K3189 Other diseases of stomach and duodenum: Secondary | ICD-10-CM | POA: Diagnosis not present

## 2013-09-06 DIAGNOSIS — K2970 Gastritis, unspecified, without bleeding: Secondary | ICD-10-CM | POA: Diagnosis not present

## 2013-09-15 DIAGNOSIS — E8881 Metabolic syndrome: Secondary | ICD-10-CM | POA: Diagnosis not present

## 2013-09-15 DIAGNOSIS — Z6834 Body mass index (BMI) 34.0-34.9, adult: Secondary | ICD-10-CM | POA: Diagnosis not present

## 2013-09-15 DIAGNOSIS — F329 Major depressive disorder, single episode, unspecified: Secondary | ICD-10-CM | POA: Diagnosis not present

## 2013-09-15 DIAGNOSIS — I10 Essential (primary) hypertension: Secondary | ICD-10-CM | POA: Diagnosis not present

## 2013-09-20 DIAGNOSIS — M19079 Primary osteoarthritis, unspecified ankle and foot: Secondary | ICD-10-CM | POA: Diagnosis not present

## 2013-10-01 DIAGNOSIS — F329 Major depressive disorder, single episode, unspecified: Secondary | ICD-10-CM | POA: Diagnosis not present

## 2013-10-01 DIAGNOSIS — M25519 Pain in unspecified shoulder: Secondary | ICD-10-CM | POA: Diagnosis not present

## 2013-10-01 DIAGNOSIS — Z6834 Body mass index (BMI) 34.0-34.9, adult: Secondary | ICD-10-CM | POA: Diagnosis not present

## 2013-10-01 DIAGNOSIS — I10 Essential (primary) hypertension: Secondary | ICD-10-CM | POA: Diagnosis not present

## 2013-10-03 DIAGNOSIS — G2589 Other specified extrapyramidal and movement disorders: Secondary | ICD-10-CM | POA: Diagnosis not present

## 2013-10-03 DIAGNOSIS — M25519 Pain in unspecified shoulder: Secondary | ICD-10-CM | POA: Diagnosis not present

## 2013-10-03 DIAGNOSIS — Z6834 Body mass index (BMI) 34.0-34.9, adult: Secondary | ICD-10-CM | POA: Diagnosis not present

## 2013-10-03 DIAGNOSIS — I10 Essential (primary) hypertension: Secondary | ICD-10-CM | POA: Diagnosis not present

## 2013-10-14 IMAGING — CR DG SHOULDER 1V*L*
1 series · 1 of 1 positions shown · non-contrast
Comparison: None.

CLINICAL DATA: Status post left shoulder replacement.

PORTABLE LEFT SHOULDER - 2+ VIEW

[AP]
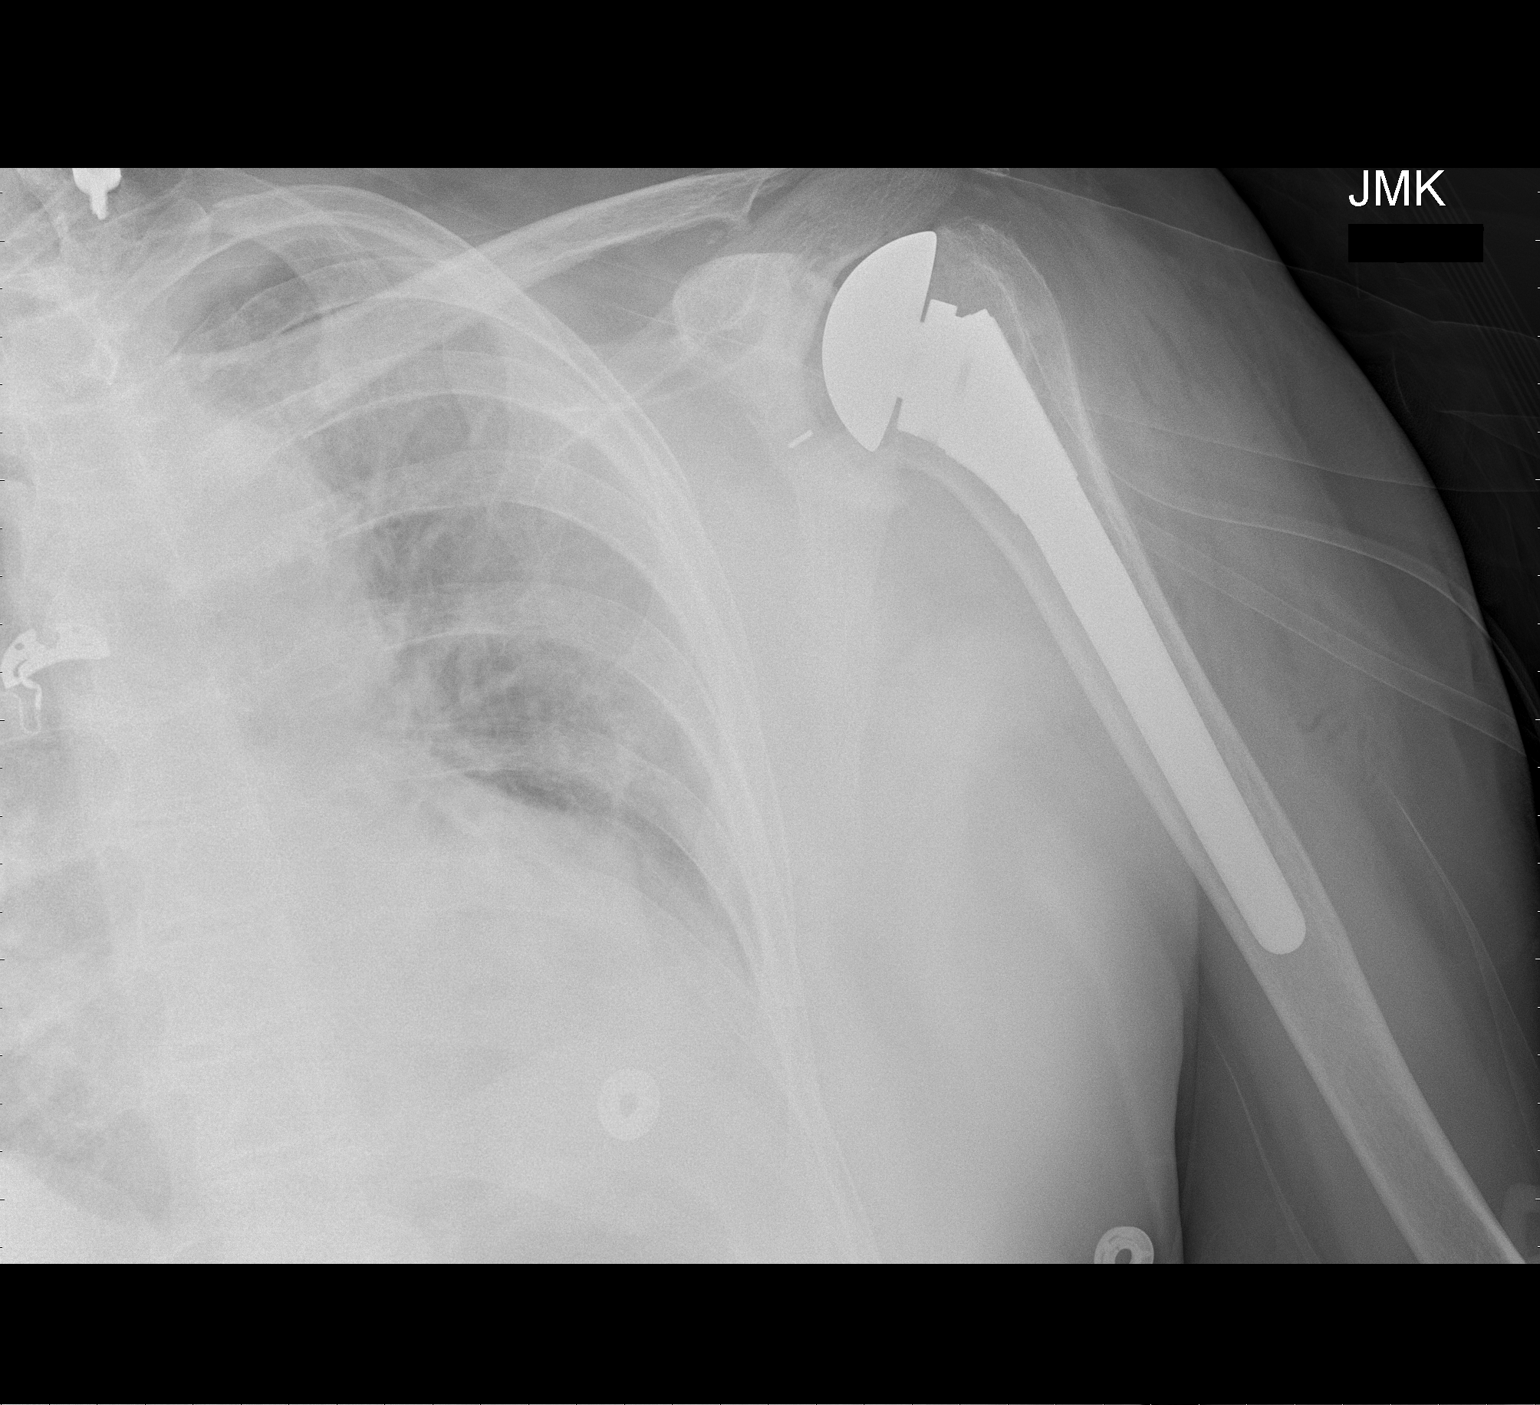

[1 of 1 positions shown; findings below may reference images not displayed]

FINDINGS: Left shoulder replacement is in place.  There is no
fracture and the device appears located.  Surgical drain is noted.
Postoperative change at the acromioclavicular joint is seen.
IMPRESSION: Left shoulder replacement without evidence of complication.

## 2013-10-15 DIAGNOSIS — I10 Essential (primary) hypertension: Secondary | ICD-10-CM | POA: Diagnosis not present

## 2013-10-15 DIAGNOSIS — E8881 Metabolic syndrome: Secondary | ICD-10-CM | POA: Diagnosis not present

## 2013-10-15 DIAGNOSIS — M159 Polyosteoarthritis, unspecified: Secondary | ICD-10-CM | POA: Diagnosis not present

## 2013-10-15 DIAGNOSIS — Z6834 Body mass index (BMI) 34.0-34.9, adult: Secondary | ICD-10-CM | POA: Diagnosis not present

## 2013-10-18 DIAGNOSIS — H4011X0 Primary open-angle glaucoma, stage unspecified: Secondary | ICD-10-CM | POA: Diagnosis not present

## 2013-12-15 DIAGNOSIS — I10 Essential (primary) hypertension: Secondary | ICD-10-CM | POA: Diagnosis not present

## 2013-12-15 DIAGNOSIS — D649 Anemia, unspecified: Secondary | ICD-10-CM | POA: Diagnosis not present

## 2013-12-15 DIAGNOSIS — Z9181 History of falling: Secondary | ICD-10-CM | POA: Diagnosis not present

## 2013-12-15 DIAGNOSIS — K219 Gastro-esophageal reflux disease without esophagitis: Secondary | ICD-10-CM | POA: Diagnosis not present

## 2013-12-15 DIAGNOSIS — E8881 Metabolic syndrome: Secondary | ICD-10-CM | POA: Diagnosis not present

## 2013-12-15 DIAGNOSIS — Z1331 Encounter for screening for depression: Secondary | ICD-10-CM | POA: Diagnosis not present

## 2013-12-15 DIAGNOSIS — E7800 Pure hypercholesterolemia, unspecified: Secondary | ICD-10-CM | POA: Diagnosis not present

## 2014-01-20 DIAGNOSIS — H4011X0 Primary open-angle glaucoma, stage unspecified: Secondary | ICD-10-CM | POA: Diagnosis not present

## 2014-02-13 DIAGNOSIS — E8881 Metabolic syndrome: Secondary | ICD-10-CM | POA: Diagnosis not present

## 2014-02-13 DIAGNOSIS — E7800 Pure hypercholesterolemia, unspecified: Secondary | ICD-10-CM | POA: Diagnosis not present

## 2014-02-13 DIAGNOSIS — M899 Disorder of bone, unspecified: Secondary | ICD-10-CM | POA: Diagnosis not present

## 2014-02-13 DIAGNOSIS — K219 Gastro-esophageal reflux disease without esophagitis: Secondary | ICD-10-CM | POA: Diagnosis not present

## 2014-02-13 DIAGNOSIS — M949 Disorder of cartilage, unspecified: Secondary | ICD-10-CM | POA: Diagnosis not present

## 2014-03-16 DIAGNOSIS — E8881 Metabolic syndrome: Secondary | ICD-10-CM | POA: Diagnosis not present

## 2014-03-16 DIAGNOSIS — J209 Acute bronchitis, unspecified: Secondary | ICD-10-CM | POA: Diagnosis not present

## 2014-03-16 DIAGNOSIS — K219 Gastro-esophageal reflux disease without esophagitis: Secondary | ICD-10-CM | POA: Diagnosis not present

## 2014-03-16 DIAGNOSIS — F411 Generalized anxiety disorder: Secondary | ICD-10-CM | POA: Diagnosis not present

## 2014-03-16 DIAGNOSIS — I10 Essential (primary) hypertension: Secondary | ICD-10-CM | POA: Diagnosis not present

## 2014-03-16 DIAGNOSIS — D649 Anemia, unspecified: Secondary | ICD-10-CM | POA: Diagnosis not present

## 2014-03-28 DIAGNOSIS — E8881 Metabolic syndrome: Secondary | ICD-10-CM | POA: Diagnosis not present

## 2014-03-28 DIAGNOSIS — K219 Gastro-esophageal reflux disease without esophagitis: Secondary | ICD-10-CM | POA: Diagnosis not present

## 2014-03-28 DIAGNOSIS — F0280 Dementia in other diseases classified elsewhere without behavioral disturbance: Secondary | ICD-10-CM | POA: Diagnosis not present

## 2014-03-28 DIAGNOSIS — G309 Alzheimer's disease, unspecified: Secondary | ICD-10-CM | POA: Diagnosis not present

## 2014-03-28 DIAGNOSIS — J209 Acute bronchitis, unspecified: Secondary | ICD-10-CM | POA: Diagnosis not present

## 2014-04-18 DIAGNOSIS — H4011X0 Primary open-angle glaucoma, stage unspecified: Secondary | ICD-10-CM | POA: Diagnosis not present

## 2014-04-20 DIAGNOSIS — E7800 Pure hypercholesterolemia, unspecified: Secondary | ICD-10-CM | POA: Diagnosis not present

## 2014-04-20 DIAGNOSIS — K219 Gastro-esophageal reflux disease without esophagitis: Secondary | ICD-10-CM | POA: Diagnosis not present

## 2014-04-20 DIAGNOSIS — I10 Essential (primary) hypertension: Secondary | ICD-10-CM | POA: Diagnosis not present

## 2014-04-20 DIAGNOSIS — E8881 Metabolic syndrome: Secondary | ICD-10-CM | POA: Diagnosis not present

## 2014-04-20 DIAGNOSIS — M159 Polyosteoarthritis, unspecified: Secondary | ICD-10-CM | POA: Diagnosis not present

## 2014-05-30 DIAGNOSIS — M949 Disorder of cartilage, unspecified: Secondary | ICD-10-CM | POA: Diagnosis not present

## 2014-05-30 DIAGNOSIS — E8881 Metabolic syndrome: Secondary | ICD-10-CM | POA: Diagnosis not present

## 2014-05-30 DIAGNOSIS — G309 Alzheimer's disease, unspecified: Secondary | ICD-10-CM | POA: Diagnosis not present

## 2014-05-30 DIAGNOSIS — F411 Generalized anxiety disorder: Secondary | ICD-10-CM | POA: Diagnosis not present

## 2014-05-30 DIAGNOSIS — M899 Disorder of bone, unspecified: Secondary | ICD-10-CM | POA: Diagnosis not present

## 2014-05-30 DIAGNOSIS — F0280 Dementia in other diseases classified elsewhere without behavioral disturbance: Secondary | ICD-10-CM | POA: Diagnosis not present

## 2014-06-20 DIAGNOSIS — M159 Polyosteoarthritis, unspecified: Secondary | ICD-10-CM | POA: Diagnosis not present

## 2014-06-20 DIAGNOSIS — E7800 Pure hypercholesterolemia, unspecified: Secondary | ICD-10-CM | POA: Diagnosis not present

## 2014-06-20 DIAGNOSIS — M949 Disorder of cartilage, unspecified: Secondary | ICD-10-CM | POA: Diagnosis not present

## 2014-06-20 DIAGNOSIS — E8881 Metabolic syndrome: Secondary | ICD-10-CM | POA: Diagnosis not present

## 2014-06-20 DIAGNOSIS — M899 Disorder of bone, unspecified: Secondary | ICD-10-CM | POA: Diagnosis not present

## 2014-07-03 DIAGNOSIS — E7800 Pure hypercholesterolemia, unspecified: Secondary | ICD-10-CM | POA: Diagnosis not present

## 2014-07-03 DIAGNOSIS — M949 Disorder of cartilage, unspecified: Secondary | ICD-10-CM | POA: Diagnosis not present

## 2014-07-03 DIAGNOSIS — D649 Anemia, unspecified: Secondary | ICD-10-CM | POA: Diagnosis not present

## 2014-07-03 DIAGNOSIS — E8881 Metabolic syndrome: Secondary | ICD-10-CM | POA: Diagnosis not present

## 2014-07-03 DIAGNOSIS — M899 Disorder of bone, unspecified: Secondary | ICD-10-CM | POA: Diagnosis not present

## 2014-08-17 DIAGNOSIS — M159 Polyosteoarthritis, unspecified: Secondary | ICD-10-CM | POA: Diagnosis not present

## 2014-08-17 DIAGNOSIS — G309 Alzheimer's disease, unspecified: Secondary | ICD-10-CM | POA: Diagnosis not present

## 2014-08-17 DIAGNOSIS — I10 Essential (primary) hypertension: Secondary | ICD-10-CM | POA: Diagnosis not present

## 2014-08-17 DIAGNOSIS — F0280 Dementia in other diseases classified elsewhere without behavioral disturbance: Secondary | ICD-10-CM | POA: Diagnosis not present

## 2014-08-17 DIAGNOSIS — E8881 Metabolic syndrome: Secondary | ICD-10-CM | POA: Diagnosis not present

## 2014-09-12 DIAGNOSIS — H4010X2 Unspecified open-angle glaucoma, moderate stage: Secondary | ICD-10-CM | POA: Diagnosis not present

## 2014-09-13 DIAGNOSIS — G2581 Restless legs syndrome: Secondary | ICD-10-CM | POA: Diagnosis not present

## 2014-09-13 DIAGNOSIS — D649 Anemia, unspecified: Secondary | ICD-10-CM | POA: Diagnosis not present

## 2014-09-13 DIAGNOSIS — I10 Essential (primary) hypertension: Secondary | ICD-10-CM | POA: Diagnosis not present

## 2014-09-13 DIAGNOSIS — K219 Gastro-esophageal reflux disease without esophagitis: Secondary | ICD-10-CM | POA: Diagnosis not present

## 2014-09-13 DIAGNOSIS — E780 Pure hypercholesterolemia: Secondary | ICD-10-CM | POA: Diagnosis not present

## 2014-09-13 DIAGNOSIS — M8580 Other specified disorders of bone density and structure, unspecified site: Secondary | ICD-10-CM | POA: Diagnosis not present

## 2014-09-13 DIAGNOSIS — E8881 Metabolic syndrome: Secondary | ICD-10-CM | POA: Diagnosis not present

## 2014-09-13 DIAGNOSIS — L03116 Cellulitis of left lower limb: Secondary | ICD-10-CM | POA: Diagnosis not present

## 2014-09-28 DIAGNOSIS — M8580 Other specified disorders of bone density and structure, unspecified site: Secondary | ICD-10-CM | POA: Diagnosis not present

## 2014-09-28 DIAGNOSIS — L03116 Cellulitis of left lower limb: Secondary | ICD-10-CM | POA: Diagnosis not present

## 2014-09-28 DIAGNOSIS — I10 Essential (primary) hypertension: Secondary | ICD-10-CM | POA: Diagnosis not present

## 2014-09-28 DIAGNOSIS — E8881 Metabolic syndrome: Secondary | ICD-10-CM | POA: Diagnosis not present

## 2014-09-28 DIAGNOSIS — D649 Anemia, unspecified: Secondary | ICD-10-CM | POA: Diagnosis not present

## 2014-09-28 DIAGNOSIS — E780 Pure hypercholesterolemia: Secondary | ICD-10-CM | POA: Diagnosis not present

## 2014-09-28 DIAGNOSIS — G2581 Restless legs syndrome: Secondary | ICD-10-CM | POA: Diagnosis not present

## 2014-09-28 DIAGNOSIS — K219 Gastro-esophageal reflux disease without esophagitis: Secondary | ICD-10-CM | POA: Diagnosis not present

## 2014-10-09 DIAGNOSIS — I10 Essential (primary) hypertension: Secondary | ICD-10-CM | POA: Diagnosis not present

## 2014-10-09 DIAGNOSIS — Z1231 Encounter for screening mammogram for malignant neoplasm of breast: Secondary | ICD-10-CM | POA: Diagnosis not present

## 2014-10-09 DIAGNOSIS — Z23 Encounter for immunization: Secondary | ICD-10-CM | POA: Diagnosis not present

## 2014-10-09 DIAGNOSIS — E780 Pure hypercholesterolemia: Secondary | ICD-10-CM | POA: Diagnosis not present

## 2014-10-09 DIAGNOSIS — D649 Anemia, unspecified: Secondary | ICD-10-CM | POA: Diagnosis not present

## 2014-10-09 DIAGNOSIS — Z79899 Other long term (current) drug therapy: Secondary | ICD-10-CM | POA: Diagnosis not present

## 2014-10-09 DIAGNOSIS — Z1389 Encounter for screening for other disorder: Secondary | ICD-10-CM | POA: Diagnosis not present

## 2014-10-09 DIAGNOSIS — Z9181 History of falling: Secondary | ICD-10-CM | POA: Diagnosis not present

## 2014-11-02 DIAGNOSIS — K219 Gastro-esophageal reflux disease without esophagitis: Secondary | ICD-10-CM | POA: Diagnosis not present

## 2014-11-02 DIAGNOSIS — E8881 Metabolic syndrome: Secondary | ICD-10-CM | POA: Diagnosis not present

## 2014-11-02 DIAGNOSIS — F329 Major depressive disorder, single episode, unspecified: Secondary | ICD-10-CM | POA: Diagnosis not present

## 2014-11-02 DIAGNOSIS — K625 Hemorrhage of anus and rectum: Secondary | ICD-10-CM | POA: Diagnosis not present

## 2014-11-02 DIAGNOSIS — M8580 Other specified disorders of bone density and structure, unspecified site: Secondary | ICD-10-CM | POA: Diagnosis not present

## 2014-11-02 DIAGNOSIS — G2581 Restless legs syndrome: Secondary | ICD-10-CM | POA: Diagnosis not present

## 2014-11-02 DIAGNOSIS — M159 Polyosteoarthritis, unspecified: Secondary | ICD-10-CM | POA: Diagnosis not present

## 2014-11-02 DIAGNOSIS — R600 Localized edema: Secondary | ICD-10-CM | POA: Diagnosis not present

## 2014-11-14 DIAGNOSIS — F329 Major depressive disorder, single episode, unspecified: Secondary | ICD-10-CM | POA: Diagnosis not present

## 2014-11-14 DIAGNOSIS — K219 Gastro-esophageal reflux disease without esophagitis: Secondary | ICD-10-CM | POA: Diagnosis not present

## 2014-11-14 DIAGNOSIS — E8881 Metabolic syndrome: Secondary | ICD-10-CM | POA: Diagnosis not present

## 2014-11-14 DIAGNOSIS — M8580 Other specified disorders of bone density and structure, unspecified site: Secondary | ICD-10-CM | POA: Diagnosis not present

## 2014-11-14 DIAGNOSIS — G2581 Restless legs syndrome: Secondary | ICD-10-CM | POA: Diagnosis not present

## 2014-11-14 DIAGNOSIS — K625 Hemorrhage of anus and rectum: Secondary | ICD-10-CM | POA: Diagnosis not present

## 2014-11-14 DIAGNOSIS — R600 Localized edema: Secondary | ICD-10-CM | POA: Diagnosis not present

## 2014-11-14 DIAGNOSIS — M159 Polyosteoarthritis, unspecified: Secondary | ICD-10-CM | POA: Diagnosis not present

## 2014-11-15 DIAGNOSIS — K219 Gastro-esophageal reflux disease without esophagitis: Secondary | ICD-10-CM | POA: Diagnosis not present

## 2014-11-15 DIAGNOSIS — Z86010 Personal history of colonic polyps: Secondary | ICD-10-CM | POA: Diagnosis not present

## 2014-11-15 DIAGNOSIS — R1310 Dysphagia, unspecified: Secondary | ICD-10-CM | POA: Diagnosis not present

## 2014-12-14 DIAGNOSIS — G2581 Restless legs syndrome: Secondary | ICD-10-CM | POA: Diagnosis not present

## 2014-12-14 DIAGNOSIS — E8881 Metabolic syndrome: Secondary | ICD-10-CM | POA: Diagnosis not present

## 2014-12-14 DIAGNOSIS — E780 Pure hypercholesterolemia: Secondary | ICD-10-CM | POA: Diagnosis not present

## 2014-12-14 DIAGNOSIS — J209 Acute bronchitis, unspecified: Secondary | ICD-10-CM | POA: Diagnosis not present

## 2014-12-14 DIAGNOSIS — G309 Alzheimer's disease, unspecified: Secondary | ICD-10-CM | POA: Diagnosis not present

## 2014-12-14 DIAGNOSIS — K219 Gastro-esophageal reflux disease without esophagitis: Secondary | ICD-10-CM | POA: Diagnosis not present

## 2014-12-14 DIAGNOSIS — R600 Localized edema: Secondary | ICD-10-CM | POA: Diagnosis not present

## 2014-12-14 DIAGNOSIS — F419 Anxiety disorder, unspecified: Secondary | ICD-10-CM | POA: Diagnosis not present

## 2014-12-14 DIAGNOSIS — M8580 Other specified disorders of bone density and structure, unspecified site: Secondary | ICD-10-CM | POA: Diagnosis not present

## 2014-12-14 DIAGNOSIS — F329 Major depressive disorder, single episode, unspecified: Secondary | ICD-10-CM | POA: Diagnosis not present

## 2014-12-14 DIAGNOSIS — D649 Anemia, unspecified: Secondary | ICD-10-CM | POA: Diagnosis not present

## 2014-12-18 DIAGNOSIS — K219 Gastro-esophageal reflux disease without esophagitis: Secondary | ICD-10-CM | POA: Diagnosis not present

## 2014-12-18 DIAGNOSIS — K5730 Diverticulosis of large intestine without perforation or abscess without bleeding: Secondary | ICD-10-CM | POA: Diagnosis not present

## 2014-12-19 DIAGNOSIS — H4011X1 Primary open-angle glaucoma, mild stage: Secondary | ICD-10-CM | POA: Diagnosis not present

## 2014-12-19 DIAGNOSIS — H04123 Dry eye syndrome of bilateral lacrimal glands: Secondary | ICD-10-CM | POA: Diagnosis not present

## 2015-01-02 DIAGNOSIS — F419 Anxiety disorder, unspecified: Secondary | ICD-10-CM | POA: Diagnosis not present

## 2015-01-02 DIAGNOSIS — D649 Anemia, unspecified: Secondary | ICD-10-CM | POA: Diagnosis not present

## 2015-01-02 DIAGNOSIS — E8881 Metabolic syndrome: Secondary | ICD-10-CM | POA: Diagnosis not present

## 2015-01-02 DIAGNOSIS — F329 Major depressive disorder, single episode, unspecified: Secondary | ICD-10-CM | POA: Diagnosis not present

## 2015-01-02 DIAGNOSIS — E780 Pure hypercholesterolemia: Secondary | ICD-10-CM | POA: Diagnosis not present

## 2015-01-02 DIAGNOSIS — G2581 Restless legs syndrome: Secondary | ICD-10-CM | POA: Diagnosis not present

## 2015-01-02 DIAGNOSIS — M159 Polyosteoarthritis, unspecified: Secondary | ICD-10-CM | POA: Diagnosis not present

## 2015-01-02 DIAGNOSIS — K219 Gastro-esophageal reflux disease without esophagitis: Secondary | ICD-10-CM | POA: Diagnosis not present

## 2015-01-02 DIAGNOSIS — R600 Localized edema: Secondary | ICD-10-CM | POA: Diagnosis not present

## 2015-01-02 DIAGNOSIS — M8580 Other specified disorders of bone density and structure, unspecified site: Secondary | ICD-10-CM | POA: Diagnosis not present

## 2015-01-02 DIAGNOSIS — R32 Unspecified urinary incontinence: Secondary | ICD-10-CM | POA: Diagnosis not present

## 2015-01-02 DIAGNOSIS — J209 Acute bronchitis, unspecified: Secondary | ICD-10-CM | POA: Diagnosis not present

## 2015-01-11 DIAGNOSIS — D649 Anemia, unspecified: Secondary | ICD-10-CM | POA: Diagnosis not present

## 2015-01-11 DIAGNOSIS — K219 Gastro-esophageal reflux disease without esophagitis: Secondary | ICD-10-CM | POA: Diagnosis not present

## 2015-01-11 DIAGNOSIS — E780 Pure hypercholesterolemia: Secondary | ICD-10-CM | POA: Diagnosis not present

## 2015-01-11 DIAGNOSIS — F419 Anxiety disorder, unspecified: Secondary | ICD-10-CM | POA: Diagnosis not present

## 2015-01-11 DIAGNOSIS — I10 Essential (primary) hypertension: Secondary | ICD-10-CM | POA: Diagnosis not present

## 2015-01-11 DIAGNOSIS — F329 Major depressive disorder, single episode, unspecified: Secondary | ICD-10-CM | POA: Diagnosis not present

## 2015-01-11 DIAGNOSIS — R600 Localized edema: Secondary | ICD-10-CM | POA: Diagnosis not present

## 2015-01-11 DIAGNOSIS — M8580 Other specified disorders of bone density and structure, unspecified site: Secondary | ICD-10-CM | POA: Diagnosis not present

## 2015-01-11 DIAGNOSIS — G2581 Restless legs syndrome: Secondary | ICD-10-CM | POA: Diagnosis not present

## 2015-01-11 DIAGNOSIS — M159 Polyosteoarthritis, unspecified: Secondary | ICD-10-CM | POA: Diagnosis not present

## 2015-01-11 DIAGNOSIS — G309 Alzheimer's disease, unspecified: Secondary | ICD-10-CM | POA: Diagnosis not present

## 2015-01-11 DIAGNOSIS — E8881 Metabolic syndrome: Secondary | ICD-10-CM | POA: Diagnosis not present

## 2015-01-16 DIAGNOSIS — R3915 Urgency of urination: Secondary | ICD-10-CM | POA: Diagnosis not present

## 2015-01-16 DIAGNOSIS — R32 Unspecified urinary incontinence: Secondary | ICD-10-CM | POA: Diagnosis not present

## 2015-01-19 DIAGNOSIS — Z1231 Encounter for screening mammogram for malignant neoplasm of breast: Secondary | ICD-10-CM | POA: Diagnosis not present

## 2015-01-19 DIAGNOSIS — R928 Other abnormal and inconclusive findings on diagnostic imaging of breast: Secondary | ICD-10-CM | POA: Diagnosis not present

## 2015-02-06 DIAGNOSIS — R3915 Urgency of urination: Secondary | ICD-10-CM | POA: Diagnosis not present

## 2015-02-06 DIAGNOSIS — N952 Postmenopausal atrophic vaginitis: Secondary | ICD-10-CM | POA: Diagnosis not present

## 2015-02-08 DIAGNOSIS — M159 Polyosteoarthritis, unspecified: Secondary | ICD-10-CM | POA: Diagnosis not present

## 2015-02-08 DIAGNOSIS — R600 Localized edema: Secondary | ICD-10-CM | POA: Diagnosis not present

## 2015-02-08 DIAGNOSIS — F329 Major depressive disorder, single episode, unspecified: Secondary | ICD-10-CM | POA: Diagnosis not present

## 2015-02-08 DIAGNOSIS — K219 Gastro-esophageal reflux disease without esophagitis: Secondary | ICD-10-CM | POA: Diagnosis not present

## 2015-02-08 DIAGNOSIS — Z6832 Body mass index (BMI) 32.0-32.9, adult: Secondary | ICD-10-CM | POA: Diagnosis not present

## 2015-02-08 DIAGNOSIS — G2581 Restless legs syndrome: Secondary | ICD-10-CM | POA: Diagnosis not present

## 2015-02-08 DIAGNOSIS — E780 Pure hypercholesterolemia: Secondary | ICD-10-CM | POA: Diagnosis not present

## 2015-02-08 DIAGNOSIS — Z1389 Encounter for screening for other disorder: Secondary | ICD-10-CM | POA: Diagnosis not present

## 2015-02-08 DIAGNOSIS — G309 Alzheimer's disease, unspecified: Secondary | ICD-10-CM | POA: Diagnosis not present

## 2015-02-08 DIAGNOSIS — E8881 Metabolic syndrome: Secondary | ICD-10-CM | POA: Diagnosis not present

## 2015-02-08 DIAGNOSIS — M8580 Other specified disorders of bone density and structure, unspecified site: Secondary | ICD-10-CM | POA: Diagnosis not present

## 2015-02-08 DIAGNOSIS — I10 Essential (primary) hypertension: Secondary | ICD-10-CM | POA: Diagnosis not present

## 2015-02-20 DIAGNOSIS — R928 Other abnormal and inconclusive findings on diagnostic imaging of breast: Secondary | ICD-10-CM | POA: Diagnosis not present

## 2015-02-20 DIAGNOSIS — Z6833 Body mass index (BMI) 33.0-33.9, adult: Secondary | ICD-10-CM | POA: Diagnosis not present

## 2015-02-28 DIAGNOSIS — N63 Unspecified lump in breast: Secondary | ICD-10-CM | POA: Diagnosis not present

## 2015-02-28 DIAGNOSIS — R928 Other abnormal and inconclusive findings on diagnostic imaging of breast: Secondary | ICD-10-CM | POA: Diagnosis not present

## 2015-03-08 DIAGNOSIS — N952 Postmenopausal atrophic vaginitis: Secondary | ICD-10-CM | POA: Diagnosis not present

## 2015-03-08 DIAGNOSIS — R32 Unspecified urinary incontinence: Secondary | ICD-10-CM | POA: Diagnosis not present

## 2015-03-22 DIAGNOSIS — H4011X1 Primary open-angle glaucoma, mild stage: Secondary | ICD-10-CM | POA: Diagnosis not present

## 2015-04-17 DIAGNOSIS — G309 Alzheimer's disease, unspecified: Secondary | ICD-10-CM | POA: Diagnosis not present

## 2015-04-17 DIAGNOSIS — G2581 Restless legs syndrome: Secondary | ICD-10-CM | POA: Diagnosis not present

## 2015-04-17 DIAGNOSIS — R600 Localized edema: Secondary | ICD-10-CM | POA: Diagnosis not present

## 2015-04-17 DIAGNOSIS — F329 Major depressive disorder, single episode, unspecified: Secondary | ICD-10-CM | POA: Diagnosis not present

## 2015-04-17 DIAGNOSIS — E780 Pure hypercholesterolemia: Secondary | ICD-10-CM | POA: Diagnosis not present

## 2015-04-17 DIAGNOSIS — D649 Anemia, unspecified: Secondary | ICD-10-CM | POA: Diagnosis not present

## 2015-04-17 DIAGNOSIS — M159 Polyosteoarthritis, unspecified: Secondary | ICD-10-CM | POA: Diagnosis not present

## 2015-04-17 DIAGNOSIS — F419 Anxiety disorder, unspecified: Secondary | ICD-10-CM | POA: Diagnosis not present

## 2015-04-17 DIAGNOSIS — K219 Gastro-esophageal reflux disease without esophagitis: Secondary | ICD-10-CM | POA: Diagnosis not present

## 2015-04-17 DIAGNOSIS — J209 Acute bronchitis, unspecified: Secondary | ICD-10-CM | POA: Diagnosis not present

## 2015-04-17 DIAGNOSIS — M8580 Other specified disorders of bone density and structure, unspecified site: Secondary | ICD-10-CM | POA: Diagnosis not present

## 2015-04-17 DIAGNOSIS — E8881 Metabolic syndrome: Secondary | ICD-10-CM | POA: Diagnosis not present

## 2015-05-01 DIAGNOSIS — K219 Gastro-esophageal reflux disease without esophagitis: Secondary | ICD-10-CM | POA: Diagnosis not present

## 2015-05-01 DIAGNOSIS — M159 Polyosteoarthritis, unspecified: Secondary | ICD-10-CM | POA: Diagnosis not present

## 2015-05-01 DIAGNOSIS — I10 Essential (primary) hypertension: Secondary | ICD-10-CM | POA: Diagnosis not present

## 2015-05-01 DIAGNOSIS — G2581 Restless legs syndrome: Secondary | ICD-10-CM | POA: Diagnosis not present

## 2015-05-01 DIAGNOSIS — F329 Major depressive disorder, single episode, unspecified: Secondary | ICD-10-CM | POA: Diagnosis not present

## 2015-05-01 DIAGNOSIS — F419 Anxiety disorder, unspecified: Secondary | ICD-10-CM | POA: Diagnosis not present

## 2015-05-01 DIAGNOSIS — D649 Anemia, unspecified: Secondary | ICD-10-CM | POA: Diagnosis not present

## 2015-05-01 DIAGNOSIS — M8580 Other specified disorders of bone density and structure, unspecified site: Secondary | ICD-10-CM | POA: Diagnosis not present

## 2015-05-01 DIAGNOSIS — R600 Localized edema: Secondary | ICD-10-CM | POA: Diagnosis not present

## 2015-05-01 DIAGNOSIS — J309 Allergic rhinitis, unspecified: Secondary | ICD-10-CM | POA: Diagnosis not present

## 2015-05-01 DIAGNOSIS — E8881 Metabolic syndrome: Secondary | ICD-10-CM | POA: Diagnosis not present

## 2015-05-01 DIAGNOSIS — J209 Acute bronchitis, unspecified: Secondary | ICD-10-CM | POA: Diagnosis not present

## 2015-05-15 DIAGNOSIS — K219 Gastro-esophageal reflux disease without esophagitis: Secondary | ICD-10-CM | POA: Diagnosis not present

## 2015-05-15 DIAGNOSIS — R600 Localized edema: Secondary | ICD-10-CM | POA: Diagnosis not present

## 2015-05-15 DIAGNOSIS — F419 Anxiety disorder, unspecified: Secondary | ICD-10-CM | POA: Diagnosis not present

## 2015-05-15 DIAGNOSIS — F329 Major depressive disorder, single episode, unspecified: Secondary | ICD-10-CM | POA: Diagnosis not present

## 2015-05-15 DIAGNOSIS — G2581 Restless legs syndrome: Secondary | ICD-10-CM | POA: Diagnosis not present

## 2015-05-15 DIAGNOSIS — G309 Alzheimer's disease, unspecified: Secondary | ICD-10-CM | POA: Diagnosis not present

## 2015-05-15 DIAGNOSIS — E780 Pure hypercholesterolemia: Secondary | ICD-10-CM | POA: Diagnosis not present

## 2015-05-15 DIAGNOSIS — I10 Essential (primary) hypertension: Secondary | ICD-10-CM | POA: Diagnosis not present

## 2015-05-15 DIAGNOSIS — E8881 Metabolic syndrome: Secondary | ICD-10-CM | POA: Diagnosis not present

## 2015-05-15 DIAGNOSIS — D649 Anemia, unspecified: Secondary | ICD-10-CM | POA: Diagnosis not present

## 2015-05-15 DIAGNOSIS — M159 Polyosteoarthritis, unspecified: Secondary | ICD-10-CM | POA: Diagnosis not present

## 2015-05-15 DIAGNOSIS — M8580 Other specified disorders of bone density and structure, unspecified site: Secondary | ICD-10-CM | POA: Diagnosis not present

## 2015-05-29 DIAGNOSIS — M8580 Other specified disorders of bone density and structure, unspecified site: Secondary | ICD-10-CM | POA: Diagnosis not present

## 2015-05-29 DIAGNOSIS — F329 Major depressive disorder, single episode, unspecified: Secondary | ICD-10-CM | POA: Diagnosis not present

## 2015-05-29 DIAGNOSIS — M159 Polyosteoarthritis, unspecified: Secondary | ICD-10-CM | POA: Diagnosis not present

## 2015-05-29 DIAGNOSIS — K219 Gastro-esophageal reflux disease without esophagitis: Secondary | ICD-10-CM | POA: Diagnosis not present

## 2015-05-29 DIAGNOSIS — G2581 Restless legs syndrome: Secondary | ICD-10-CM | POA: Diagnosis not present

## 2015-05-29 DIAGNOSIS — E780 Pure hypercholesterolemia: Secondary | ICD-10-CM | POA: Diagnosis not present

## 2015-05-29 DIAGNOSIS — E8881 Metabolic syndrome: Secondary | ICD-10-CM | POA: Diagnosis not present

## 2015-05-29 DIAGNOSIS — D649 Anemia, unspecified: Secondary | ICD-10-CM | POA: Diagnosis not present

## 2015-05-29 DIAGNOSIS — F419 Anxiety disorder, unspecified: Secondary | ICD-10-CM | POA: Diagnosis not present

## 2015-05-29 DIAGNOSIS — R600 Localized edema: Secondary | ICD-10-CM | POA: Diagnosis not present

## 2015-05-29 DIAGNOSIS — G309 Alzheimer's disease, unspecified: Secondary | ICD-10-CM | POA: Diagnosis not present

## 2015-05-29 DIAGNOSIS — I10 Essential (primary) hypertension: Secondary | ICD-10-CM | POA: Diagnosis not present

## 2015-06-14 DIAGNOSIS — M8580 Other specified disorders of bone density and structure, unspecified site: Secondary | ICD-10-CM | POA: Diagnosis not present

## 2015-06-14 DIAGNOSIS — J209 Acute bronchitis, unspecified: Secondary | ICD-10-CM | POA: Diagnosis not present

## 2015-06-14 DIAGNOSIS — E780 Pure hypercholesterolemia: Secondary | ICD-10-CM | POA: Diagnosis not present

## 2015-06-14 DIAGNOSIS — H4011X1 Primary open-angle glaucoma, mild stage: Secondary | ICD-10-CM | POA: Diagnosis not present

## 2015-06-14 DIAGNOSIS — I10 Essential (primary) hypertension: Secondary | ICD-10-CM | POA: Diagnosis not present

## 2015-06-14 DIAGNOSIS — G2581 Restless legs syndrome: Secondary | ICD-10-CM | POA: Diagnosis not present

## 2015-06-14 DIAGNOSIS — D649 Anemia, unspecified: Secondary | ICD-10-CM | POA: Diagnosis not present

## 2015-06-14 DIAGNOSIS — M159 Polyosteoarthritis, unspecified: Secondary | ICD-10-CM | POA: Diagnosis not present

## 2015-06-14 DIAGNOSIS — E8881 Metabolic syndrome: Secondary | ICD-10-CM | POA: Diagnosis not present

## 2015-06-14 DIAGNOSIS — K219 Gastro-esophageal reflux disease without esophagitis: Secondary | ICD-10-CM | POA: Diagnosis not present

## 2015-06-14 DIAGNOSIS — F419 Anxiety disorder, unspecified: Secondary | ICD-10-CM | POA: Diagnosis not present

## 2015-06-14 DIAGNOSIS — R600 Localized edema: Secondary | ICD-10-CM | POA: Diagnosis not present

## 2015-06-14 DIAGNOSIS — F329 Major depressive disorder, single episode, unspecified: Secondary | ICD-10-CM | POA: Diagnosis not present

## 2015-06-26 DIAGNOSIS — G309 Alzheimer's disease, unspecified: Secondary | ICD-10-CM | POA: Diagnosis not present

## 2015-06-26 DIAGNOSIS — M159 Polyosteoarthritis, unspecified: Secondary | ICD-10-CM | POA: Diagnosis not present

## 2015-06-26 DIAGNOSIS — G2581 Restless legs syndrome: Secondary | ICD-10-CM | POA: Diagnosis not present

## 2015-06-26 DIAGNOSIS — E780 Pure hypercholesterolemia: Secondary | ICD-10-CM | POA: Diagnosis not present

## 2015-06-26 DIAGNOSIS — R600 Localized edema: Secondary | ICD-10-CM | POA: Diagnosis not present

## 2015-06-26 DIAGNOSIS — D649 Anemia, unspecified: Secondary | ICD-10-CM | POA: Diagnosis not present

## 2015-06-26 DIAGNOSIS — I10 Essential (primary) hypertension: Secondary | ICD-10-CM | POA: Diagnosis not present

## 2015-06-26 DIAGNOSIS — M8580 Other specified disorders of bone density and structure, unspecified site: Secondary | ICD-10-CM | POA: Diagnosis not present

## 2015-06-26 DIAGNOSIS — E8881 Metabolic syndrome: Secondary | ICD-10-CM | POA: Diagnosis not present

## 2015-06-26 DIAGNOSIS — K219 Gastro-esophageal reflux disease without esophagitis: Secondary | ICD-10-CM | POA: Diagnosis not present

## 2015-06-26 DIAGNOSIS — F419 Anxiety disorder, unspecified: Secondary | ICD-10-CM | POA: Diagnosis not present

## 2015-06-26 DIAGNOSIS — F329 Major depressive disorder, single episode, unspecified: Secondary | ICD-10-CM | POA: Diagnosis not present

## 2015-07-24 DIAGNOSIS — G309 Alzheimer's disease, unspecified: Secondary | ICD-10-CM | POA: Diagnosis not present

## 2015-07-24 DIAGNOSIS — E8881 Metabolic syndrome: Secondary | ICD-10-CM | POA: Diagnosis not present

## 2015-07-24 DIAGNOSIS — F419 Anxiety disorder, unspecified: Secondary | ICD-10-CM | POA: Diagnosis not present

## 2015-07-24 DIAGNOSIS — G2581 Restless legs syndrome: Secondary | ICD-10-CM | POA: Diagnosis not present

## 2015-07-24 DIAGNOSIS — R600 Localized edema: Secondary | ICD-10-CM | POA: Diagnosis not present

## 2015-07-24 DIAGNOSIS — F329 Major depressive disorder, single episode, unspecified: Secondary | ICD-10-CM | POA: Diagnosis not present

## 2015-07-24 DIAGNOSIS — M159 Polyosteoarthritis, unspecified: Secondary | ICD-10-CM | POA: Diagnosis not present

## 2015-07-24 DIAGNOSIS — D649 Anemia, unspecified: Secondary | ICD-10-CM | POA: Diagnosis not present

## 2015-07-24 DIAGNOSIS — I10 Essential (primary) hypertension: Secondary | ICD-10-CM | POA: Diagnosis not present

## 2015-07-24 DIAGNOSIS — E780 Pure hypercholesterolemia: Secondary | ICD-10-CM | POA: Diagnosis not present

## 2015-07-24 DIAGNOSIS — K219 Gastro-esophageal reflux disease without esophagitis: Secondary | ICD-10-CM | POA: Diagnosis not present

## 2015-07-24 DIAGNOSIS — M8580 Other specified disorders of bone density and structure, unspecified site: Secondary | ICD-10-CM | POA: Diagnosis not present

## 2015-08-06 DIAGNOSIS — M8589 Other specified disorders of bone density and structure, multiple sites: Secondary | ICD-10-CM | POA: Diagnosis not present

## 2015-08-15 DIAGNOSIS — R928 Other abnormal and inconclusive findings on diagnostic imaging of breast: Secondary | ICD-10-CM | POA: Diagnosis not present

## 2015-08-15 DIAGNOSIS — N63 Unspecified lump in breast: Secondary | ICD-10-CM | POA: Diagnosis not present

## 2015-08-24 DIAGNOSIS — E8881 Metabolic syndrome: Secondary | ICD-10-CM | POA: Diagnosis not present

## 2015-08-24 DIAGNOSIS — F329 Major depressive disorder, single episode, unspecified: Secondary | ICD-10-CM | POA: Diagnosis not present

## 2015-08-24 DIAGNOSIS — K219 Gastro-esophageal reflux disease without esophagitis: Secondary | ICD-10-CM | POA: Diagnosis not present

## 2015-08-24 DIAGNOSIS — M8580 Other specified disorders of bone density and structure, unspecified site: Secondary | ICD-10-CM | POA: Diagnosis not present

## 2015-08-24 DIAGNOSIS — M159 Polyosteoarthritis, unspecified: Secondary | ICD-10-CM | POA: Diagnosis not present

## 2015-08-24 DIAGNOSIS — E780 Pure hypercholesterolemia: Secondary | ICD-10-CM | POA: Diagnosis not present

## 2015-08-24 DIAGNOSIS — Z23 Encounter for immunization: Secondary | ICD-10-CM | POA: Diagnosis not present

## 2015-08-24 DIAGNOSIS — R600 Localized edema: Secondary | ICD-10-CM | POA: Diagnosis not present

## 2015-08-24 DIAGNOSIS — Z9181 History of falling: Secondary | ICD-10-CM | POA: Diagnosis not present

## 2015-08-24 DIAGNOSIS — G2581 Restless legs syndrome: Secondary | ICD-10-CM | POA: Diagnosis not present

## 2015-08-24 DIAGNOSIS — F419 Anxiety disorder, unspecified: Secondary | ICD-10-CM | POA: Diagnosis not present

## 2015-08-24 DIAGNOSIS — I10 Essential (primary) hypertension: Secondary | ICD-10-CM | POA: Diagnosis not present

## 2015-08-24 DIAGNOSIS — D649 Anemia, unspecified: Secondary | ICD-10-CM | POA: Diagnosis not present

## 2015-09-24 DIAGNOSIS — I10 Essential (primary) hypertension: Secondary | ICD-10-CM | POA: Diagnosis not present

## 2015-09-24 DIAGNOSIS — K219 Gastro-esophageal reflux disease without esophagitis: Secondary | ICD-10-CM | POA: Diagnosis not present

## 2015-09-24 DIAGNOSIS — F329 Major depressive disorder, single episode, unspecified: Secondary | ICD-10-CM | POA: Diagnosis not present

## 2015-09-24 DIAGNOSIS — M8580 Other specified disorders of bone density and structure, unspecified site: Secondary | ICD-10-CM | POA: Diagnosis not present

## 2015-09-24 DIAGNOSIS — M159 Polyosteoarthritis, unspecified: Secondary | ICD-10-CM | POA: Diagnosis not present

## 2015-09-24 DIAGNOSIS — D649 Anemia, unspecified: Secondary | ICD-10-CM | POA: Diagnosis not present

## 2015-09-24 DIAGNOSIS — R600 Localized edema: Secondary | ICD-10-CM | POA: Diagnosis not present

## 2015-09-24 DIAGNOSIS — E8881 Metabolic syndrome: Secondary | ICD-10-CM | POA: Diagnosis not present

## 2015-09-24 DIAGNOSIS — F419 Anxiety disorder, unspecified: Secondary | ICD-10-CM | POA: Diagnosis not present

## 2015-09-24 DIAGNOSIS — G309 Alzheimer's disease, unspecified: Secondary | ICD-10-CM | POA: Diagnosis not present

## 2015-09-24 DIAGNOSIS — G2581 Restless legs syndrome: Secondary | ICD-10-CM | POA: Diagnosis not present

## 2015-09-25 DIAGNOSIS — H401131 Primary open-angle glaucoma, bilateral, mild stage: Secondary | ICD-10-CM | POA: Diagnosis not present

## 2015-10-15 DIAGNOSIS — E7800 Pure hypercholesterolemia, unspecified: Secondary | ICD-10-CM | POA: Diagnosis not present

## 2015-10-15 DIAGNOSIS — K219 Gastro-esophageal reflux disease without esophagitis: Secondary | ICD-10-CM | POA: Diagnosis not present

## 2015-10-15 DIAGNOSIS — D649 Anemia, unspecified: Secondary | ICD-10-CM | POA: Diagnosis not present

## 2015-10-15 DIAGNOSIS — R600 Localized edema: Secondary | ICD-10-CM | POA: Diagnosis not present

## 2015-10-15 DIAGNOSIS — F419 Anxiety disorder, unspecified: Secondary | ICD-10-CM | POA: Diagnosis not present

## 2015-10-15 DIAGNOSIS — M8580 Other specified disorders of bone density and structure, unspecified site: Secondary | ICD-10-CM | POA: Diagnosis not present

## 2015-10-15 DIAGNOSIS — G309 Alzheimer's disease, unspecified: Secondary | ICD-10-CM | POA: Diagnosis not present

## 2015-10-15 DIAGNOSIS — F329 Major depressive disorder, single episode, unspecified: Secondary | ICD-10-CM | POA: Diagnosis not present

## 2015-10-15 DIAGNOSIS — I10 Essential (primary) hypertension: Secondary | ICD-10-CM | POA: Diagnosis not present

## 2015-10-15 DIAGNOSIS — G2581 Restless legs syndrome: Secondary | ICD-10-CM | POA: Diagnosis not present

## 2015-10-15 DIAGNOSIS — E8881 Metabolic syndrome: Secondary | ICD-10-CM | POA: Diagnosis not present

## 2015-10-15 DIAGNOSIS — J101 Influenza due to other identified influenza virus with other respiratory manifestations: Secondary | ICD-10-CM | POA: Diagnosis not present

## 2015-10-24 DIAGNOSIS — K219 Gastro-esophageal reflux disease without esophagitis: Secondary | ICD-10-CM | POA: Diagnosis not present

## 2015-10-24 DIAGNOSIS — M8580 Other specified disorders of bone density and structure, unspecified site: Secondary | ICD-10-CM | POA: Diagnosis not present

## 2015-10-24 DIAGNOSIS — R600 Localized edema: Secondary | ICD-10-CM | POA: Diagnosis not present

## 2015-10-24 DIAGNOSIS — F419 Anxiety disorder, unspecified: Secondary | ICD-10-CM | POA: Diagnosis not present

## 2015-10-24 DIAGNOSIS — G309 Alzheimer's disease, unspecified: Secondary | ICD-10-CM | POA: Diagnosis not present

## 2015-10-24 DIAGNOSIS — D649 Anemia, unspecified: Secondary | ICD-10-CM | POA: Diagnosis not present

## 2015-10-24 DIAGNOSIS — E7800 Pure hypercholesterolemia, unspecified: Secondary | ICD-10-CM | POA: Diagnosis not present

## 2015-10-24 DIAGNOSIS — J209 Acute bronchitis, unspecified: Secondary | ICD-10-CM | POA: Diagnosis not present

## 2015-10-24 DIAGNOSIS — G2581 Restless legs syndrome: Secondary | ICD-10-CM | POA: Diagnosis not present

## 2015-10-24 DIAGNOSIS — F329 Major depressive disorder, single episode, unspecified: Secondary | ICD-10-CM | POA: Diagnosis not present

## 2015-10-24 DIAGNOSIS — I10 Essential (primary) hypertension: Secondary | ICD-10-CM | POA: Diagnosis not present

## 2015-10-24 DIAGNOSIS — E8881 Metabolic syndrome: Secondary | ICD-10-CM | POA: Diagnosis not present

## 2015-10-31 DIAGNOSIS — I10 Essential (primary) hypertension: Secondary | ICD-10-CM | POA: Diagnosis not present

## 2015-10-31 DIAGNOSIS — M159 Polyosteoarthritis, unspecified: Secondary | ICD-10-CM | POA: Diagnosis not present

## 2015-10-31 DIAGNOSIS — G309 Alzheimer's disease, unspecified: Secondary | ICD-10-CM | POA: Diagnosis not present

## 2015-10-31 DIAGNOSIS — K219 Gastro-esophageal reflux disease without esophagitis: Secondary | ICD-10-CM | POA: Diagnosis not present

## 2015-10-31 DIAGNOSIS — E7800 Pure hypercholesterolemia, unspecified: Secondary | ICD-10-CM | POA: Diagnosis not present

## 2015-10-31 DIAGNOSIS — F329 Major depressive disorder, single episode, unspecified: Secondary | ICD-10-CM | POA: Diagnosis not present

## 2015-10-31 DIAGNOSIS — G2581 Restless legs syndrome: Secondary | ICD-10-CM | POA: Diagnosis not present

## 2015-10-31 DIAGNOSIS — M8580 Other specified disorders of bone density and structure, unspecified site: Secondary | ICD-10-CM | POA: Diagnosis not present

## 2015-10-31 DIAGNOSIS — R600 Localized edema: Secondary | ICD-10-CM | POA: Diagnosis not present

## 2015-10-31 DIAGNOSIS — D649 Anemia, unspecified: Secondary | ICD-10-CM | POA: Diagnosis not present

## 2015-10-31 DIAGNOSIS — F419 Anxiety disorder, unspecified: Secondary | ICD-10-CM | POA: Diagnosis not present

## 2015-10-31 DIAGNOSIS — E8881 Metabolic syndrome: Secondary | ICD-10-CM | POA: Diagnosis not present

## 2016-01-01 DIAGNOSIS — D649 Anemia, unspecified: Secondary | ICD-10-CM | POA: Diagnosis not present

## 2016-01-01 DIAGNOSIS — F329 Major depressive disorder, single episode, unspecified: Secondary | ICD-10-CM | POA: Diagnosis not present

## 2016-01-01 DIAGNOSIS — M8580 Other specified disorders of bone density and structure, unspecified site: Secondary | ICD-10-CM | POA: Diagnosis not present

## 2016-01-01 DIAGNOSIS — R600 Localized edema: Secondary | ICD-10-CM | POA: Diagnosis not present

## 2016-01-01 DIAGNOSIS — F419 Anxiety disorder, unspecified: Secondary | ICD-10-CM | POA: Diagnosis not present

## 2016-01-01 DIAGNOSIS — E7800 Pure hypercholesterolemia, unspecified: Secondary | ICD-10-CM | POA: Diagnosis not present

## 2016-01-01 DIAGNOSIS — E8881 Metabolic syndrome: Secondary | ICD-10-CM | POA: Diagnosis not present

## 2016-01-01 DIAGNOSIS — G309 Alzheimer's disease, unspecified: Secondary | ICD-10-CM | POA: Diagnosis not present

## 2016-01-01 DIAGNOSIS — M159 Polyosteoarthritis, unspecified: Secondary | ICD-10-CM | POA: Diagnosis not present

## 2016-01-01 DIAGNOSIS — G2581 Restless legs syndrome: Secondary | ICD-10-CM | POA: Diagnosis not present

## 2016-01-01 DIAGNOSIS — K219 Gastro-esophageal reflux disease without esophagitis: Secondary | ICD-10-CM | POA: Diagnosis not present

## 2016-01-01 DIAGNOSIS — I10 Essential (primary) hypertension: Secondary | ICD-10-CM | POA: Diagnosis not present

## 2016-01-23 DIAGNOSIS — R1310 Dysphagia, unspecified: Secondary | ICD-10-CM | POA: Diagnosis not present

## 2016-01-23 DIAGNOSIS — K5730 Diverticulosis of large intestine without perforation or abscess without bleeding: Secondary | ICD-10-CM | POA: Diagnosis not present

## 2016-01-24 DIAGNOSIS — H401131 Primary open-angle glaucoma, bilateral, mild stage: Secondary | ICD-10-CM | POA: Diagnosis not present

## 2016-01-25 DIAGNOSIS — K219 Gastro-esophageal reflux disease without esophagitis: Secondary | ICD-10-CM | POA: Diagnosis not present

## 2016-01-25 DIAGNOSIS — R1310 Dysphagia, unspecified: Secondary | ICD-10-CM | POA: Diagnosis not present

## 2016-01-29 ENCOUNTER — Other Ambulatory Visit: Payer: Self-pay

## 2016-01-29 DIAGNOSIS — Z79899 Other long term (current) drug therapy: Secondary | ICD-10-CM | POA: Diagnosis not present

## 2016-01-29 DIAGNOSIS — K219 Gastro-esophageal reflux disease without esophagitis: Secondary | ICD-10-CM | POA: Diagnosis not present

## 2016-01-29 DIAGNOSIS — K449 Diaphragmatic hernia without obstruction or gangrene: Secondary | ICD-10-CM | POA: Diagnosis not present

## 2016-01-29 DIAGNOSIS — R1310 Dysphagia, unspecified: Secondary | ICD-10-CM | POA: Diagnosis not present

## 2016-01-29 DIAGNOSIS — Z86010 Personal history of colonic polyps: Secondary | ICD-10-CM | POA: Diagnosis not present

## 2016-01-29 DIAGNOSIS — K222 Esophageal obstruction: Secondary | ICD-10-CM | POA: Diagnosis not present

## 2016-01-29 DIAGNOSIS — Z9049 Acquired absence of other specified parts of digestive tract: Secondary | ICD-10-CM | POA: Diagnosis not present

## 2016-01-29 DIAGNOSIS — K2940 Chronic atrophic gastritis without bleeding: Secondary | ICD-10-CM | POA: Diagnosis not present

## 2016-01-29 DIAGNOSIS — I10 Essential (primary) hypertension: Secondary | ICD-10-CM | POA: Diagnosis not present

## 2016-01-29 DIAGNOSIS — Z800 Family history of malignant neoplasm of digestive organs: Secondary | ICD-10-CM | POA: Diagnosis not present

## 2016-02-28 DIAGNOSIS — K5730 Diverticulosis of large intestine without perforation or abscess without bleeding: Secondary | ICD-10-CM | POA: Diagnosis not present

## 2016-02-28 DIAGNOSIS — R1310 Dysphagia, unspecified: Secondary | ICD-10-CM | POA: Diagnosis not present

## 2016-03-04 DIAGNOSIS — Z1389 Encounter for screening for other disorder: Secondary | ICD-10-CM | POA: Diagnosis not present

## 2016-03-04 DIAGNOSIS — F419 Anxiety disorder, unspecified: Secondary | ICD-10-CM | POA: Diagnosis not present

## 2016-03-04 DIAGNOSIS — G2581 Restless legs syndrome: Secondary | ICD-10-CM | POA: Diagnosis not present

## 2016-03-04 DIAGNOSIS — F329 Major depressive disorder, single episode, unspecified: Secondary | ICD-10-CM | POA: Diagnosis not present

## 2016-03-04 DIAGNOSIS — M8580 Other specified disorders of bone density and structure, unspecified site: Secondary | ICD-10-CM | POA: Diagnosis not present

## 2016-03-04 DIAGNOSIS — G309 Alzheimer's disease, unspecified: Secondary | ICD-10-CM | POA: Diagnosis not present

## 2016-03-04 DIAGNOSIS — K219 Gastro-esophageal reflux disease without esophagitis: Secondary | ICD-10-CM | POA: Diagnosis not present

## 2016-03-04 DIAGNOSIS — I10 Essential (primary) hypertension: Secondary | ICD-10-CM | POA: Diagnosis not present

## 2016-03-04 DIAGNOSIS — D649 Anemia, unspecified: Secondary | ICD-10-CM | POA: Diagnosis not present

## 2016-03-04 DIAGNOSIS — E8881 Metabolic syndrome: Secondary | ICD-10-CM | POA: Diagnosis not present

## 2016-03-04 DIAGNOSIS — R600 Localized edema: Secondary | ICD-10-CM | POA: Diagnosis not present

## 2016-03-04 DIAGNOSIS — E7800 Pure hypercholesterolemia, unspecified: Secondary | ICD-10-CM | POA: Diagnosis not present

## 2016-04-24 DIAGNOSIS — H04123 Dry eye syndrome of bilateral lacrimal glands: Secondary | ICD-10-CM | POA: Diagnosis not present

## 2016-04-24 DIAGNOSIS — H401131 Primary open-angle glaucoma, bilateral, mild stage: Secondary | ICD-10-CM | POA: Diagnosis not present

## 2016-05-05 DIAGNOSIS — G2581 Restless legs syndrome: Secondary | ICD-10-CM | POA: Diagnosis not present

## 2016-05-05 DIAGNOSIS — E8881 Metabolic syndrome: Secondary | ICD-10-CM | POA: Diagnosis not present

## 2016-05-05 DIAGNOSIS — F419 Anxiety disorder, unspecified: Secondary | ICD-10-CM | POA: Diagnosis not present

## 2016-05-05 DIAGNOSIS — D649 Anemia, unspecified: Secondary | ICD-10-CM | POA: Diagnosis not present

## 2016-05-05 DIAGNOSIS — G309 Alzheimer's disease, unspecified: Secondary | ICD-10-CM | POA: Diagnosis not present

## 2016-05-05 DIAGNOSIS — F329 Major depressive disorder, single episode, unspecified: Secondary | ICD-10-CM | POA: Diagnosis not present

## 2016-05-05 DIAGNOSIS — I10 Essential (primary) hypertension: Secondary | ICD-10-CM | POA: Diagnosis not present

## 2016-05-05 DIAGNOSIS — R600 Localized edema: Secondary | ICD-10-CM | POA: Diagnosis not present

## 2016-05-05 DIAGNOSIS — M159 Polyosteoarthritis, unspecified: Secondary | ICD-10-CM | POA: Diagnosis not present

## 2016-05-05 DIAGNOSIS — E7800 Pure hypercholesterolemia, unspecified: Secondary | ICD-10-CM | POA: Diagnosis not present

## 2016-05-05 DIAGNOSIS — M8580 Other specified disorders of bone density and structure, unspecified site: Secondary | ICD-10-CM | POA: Diagnosis not present

## 2016-05-05 DIAGNOSIS — K219 Gastro-esophageal reflux disease without esophagitis: Secondary | ICD-10-CM | POA: Diagnosis not present

## 2016-07-04 DIAGNOSIS — E7800 Pure hypercholesterolemia, unspecified: Secondary | ICD-10-CM | POA: Diagnosis not present

## 2016-07-04 DIAGNOSIS — F419 Anxiety disorder, unspecified: Secondary | ICD-10-CM | POA: Diagnosis not present

## 2016-07-04 DIAGNOSIS — M8580 Other specified disorders of bone density and structure, unspecified site: Secondary | ICD-10-CM | POA: Diagnosis not present

## 2016-07-04 DIAGNOSIS — I10 Essential (primary) hypertension: Secondary | ICD-10-CM | POA: Diagnosis not present

## 2016-07-04 DIAGNOSIS — R112 Nausea with vomiting, unspecified: Secondary | ICD-10-CM | POA: Diagnosis not present

## 2016-07-04 DIAGNOSIS — G2581 Restless legs syndrome: Secondary | ICD-10-CM | POA: Diagnosis not present

## 2016-07-04 DIAGNOSIS — E8881 Metabolic syndrome: Secondary | ICD-10-CM | POA: Diagnosis not present

## 2016-07-04 DIAGNOSIS — R600 Localized edema: Secondary | ICD-10-CM | POA: Diagnosis not present

## 2016-07-04 DIAGNOSIS — F329 Major depressive disorder, single episode, unspecified: Secondary | ICD-10-CM | POA: Diagnosis not present

## 2016-07-04 DIAGNOSIS — K219 Gastro-esophageal reflux disease without esophagitis: Secondary | ICD-10-CM | POA: Diagnosis not present

## 2016-07-04 DIAGNOSIS — D649 Anemia, unspecified: Secondary | ICD-10-CM | POA: Diagnosis not present

## 2016-07-04 DIAGNOSIS — G309 Alzheimer's disease, unspecified: Secondary | ICD-10-CM | POA: Diagnosis not present

## 2016-07-18 DIAGNOSIS — F329 Major depressive disorder, single episode, unspecified: Secondary | ICD-10-CM | POA: Diagnosis not present

## 2016-07-18 DIAGNOSIS — M8580 Other specified disorders of bone density and structure, unspecified site: Secondary | ICD-10-CM | POA: Diagnosis not present

## 2016-07-18 DIAGNOSIS — K219 Gastro-esophageal reflux disease without esophagitis: Secondary | ICD-10-CM | POA: Diagnosis not present

## 2016-07-18 DIAGNOSIS — F419 Anxiety disorder, unspecified: Secondary | ICD-10-CM | POA: Diagnosis not present

## 2016-07-18 DIAGNOSIS — R600 Localized edema: Secondary | ICD-10-CM | POA: Diagnosis not present

## 2016-07-18 DIAGNOSIS — J209 Acute bronchitis, unspecified: Secondary | ICD-10-CM | POA: Diagnosis not present

## 2016-07-18 DIAGNOSIS — E8881 Metabolic syndrome: Secondary | ICD-10-CM | POA: Diagnosis not present

## 2016-07-18 DIAGNOSIS — G2581 Restless legs syndrome: Secondary | ICD-10-CM | POA: Diagnosis not present

## 2016-07-18 DIAGNOSIS — I10 Essential (primary) hypertension: Secondary | ICD-10-CM | POA: Diagnosis not present

## 2016-07-18 DIAGNOSIS — G309 Alzheimer's disease, unspecified: Secondary | ICD-10-CM | POA: Diagnosis not present

## 2016-07-18 DIAGNOSIS — D649 Anemia, unspecified: Secondary | ICD-10-CM | POA: Diagnosis not present

## 2016-07-18 DIAGNOSIS — E7800 Pure hypercholesterolemia, unspecified: Secondary | ICD-10-CM | POA: Diagnosis not present

## 2016-08-01 DIAGNOSIS — F329 Major depressive disorder, single episode, unspecified: Secondary | ICD-10-CM | POA: Diagnosis not present

## 2016-08-01 DIAGNOSIS — E8881 Metabolic syndrome: Secondary | ICD-10-CM | POA: Diagnosis not present

## 2016-08-01 DIAGNOSIS — G309 Alzheimer's disease, unspecified: Secondary | ICD-10-CM | POA: Diagnosis not present

## 2016-08-01 DIAGNOSIS — M8580 Other specified disorders of bone density and structure, unspecified site: Secondary | ICD-10-CM | POA: Diagnosis not present

## 2016-08-01 DIAGNOSIS — I10 Essential (primary) hypertension: Secondary | ICD-10-CM | POA: Diagnosis not present

## 2016-08-01 DIAGNOSIS — R600 Localized edema: Secondary | ICD-10-CM | POA: Diagnosis not present

## 2016-08-01 DIAGNOSIS — F419 Anxiety disorder, unspecified: Secondary | ICD-10-CM | POA: Diagnosis not present

## 2016-08-01 DIAGNOSIS — K219 Gastro-esophageal reflux disease without esophagitis: Secondary | ICD-10-CM | POA: Diagnosis not present

## 2016-08-01 DIAGNOSIS — G2581 Restless legs syndrome: Secondary | ICD-10-CM | POA: Diagnosis not present

## 2016-08-01 DIAGNOSIS — D649 Anemia, unspecified: Secondary | ICD-10-CM | POA: Diagnosis not present

## 2016-08-01 DIAGNOSIS — R112 Nausea with vomiting, unspecified: Secondary | ICD-10-CM | POA: Diagnosis not present

## 2016-08-01 DIAGNOSIS — E7800 Pure hypercholesterolemia, unspecified: Secondary | ICD-10-CM | POA: Diagnosis not present

## 2016-08-18 DIAGNOSIS — M8580 Other specified disorders of bone density and structure, unspecified site: Secondary | ICD-10-CM | POA: Diagnosis not present

## 2016-08-18 DIAGNOSIS — K219 Gastro-esophageal reflux disease without esophagitis: Secondary | ICD-10-CM | POA: Diagnosis not present

## 2016-08-18 DIAGNOSIS — E8881 Metabolic syndrome: Secondary | ICD-10-CM | POA: Diagnosis not present

## 2016-08-18 DIAGNOSIS — F419 Anxiety disorder, unspecified: Secondary | ICD-10-CM | POA: Diagnosis not present

## 2016-08-18 DIAGNOSIS — Z23 Encounter for immunization: Secondary | ICD-10-CM | POA: Diagnosis not present

## 2016-08-18 DIAGNOSIS — G2581 Restless legs syndrome: Secondary | ICD-10-CM | POA: Diagnosis not present

## 2016-08-18 DIAGNOSIS — G309 Alzheimer's disease, unspecified: Secondary | ICD-10-CM | POA: Diagnosis not present

## 2016-08-18 DIAGNOSIS — I10 Essential (primary) hypertension: Secondary | ICD-10-CM | POA: Diagnosis not present

## 2016-08-18 DIAGNOSIS — E7800 Pure hypercholesterolemia, unspecified: Secondary | ICD-10-CM | POA: Diagnosis not present

## 2016-08-18 DIAGNOSIS — F329 Major depressive disorder, single episode, unspecified: Secondary | ICD-10-CM | POA: Diagnosis not present

## 2016-08-18 DIAGNOSIS — D649 Anemia, unspecified: Secondary | ICD-10-CM | POA: Diagnosis not present

## 2016-08-18 DIAGNOSIS — R600 Localized edema: Secondary | ICD-10-CM | POA: Diagnosis not present

## 2016-09-03 DIAGNOSIS — K219 Gastro-esophageal reflux disease without esophagitis: Secondary | ICD-10-CM | POA: Diagnosis not present

## 2016-09-03 DIAGNOSIS — R112 Nausea with vomiting, unspecified: Secondary | ICD-10-CM | POA: Diagnosis not present

## 2016-09-03 DIAGNOSIS — R1310 Dysphagia, unspecified: Secondary | ICD-10-CM | POA: Diagnosis not present

## 2016-09-03 DIAGNOSIS — K5730 Diverticulosis of large intestine without perforation or abscess without bleeding: Secondary | ICD-10-CM | POA: Diagnosis not present

## 2016-09-09 DIAGNOSIS — R634 Abnormal weight loss: Secondary | ICD-10-CM | POA: Diagnosis not present

## 2016-09-09 DIAGNOSIS — K869 Disease of pancreas, unspecified: Secondary | ICD-10-CM | POA: Diagnosis not present

## 2016-09-09 DIAGNOSIS — R1031 Right lower quadrant pain: Secondary | ICD-10-CM | POA: Diagnosis not present

## 2016-09-09 DIAGNOSIS — K4090 Unilateral inguinal hernia, without obstruction or gangrene, not specified as recurrent: Secondary | ICD-10-CM | POA: Diagnosis not present

## 2016-09-09 DIAGNOSIS — K5790 Diverticulosis of intestine, part unspecified, without perforation or abscess without bleeding: Secondary | ICD-10-CM | POA: Diagnosis not present

## 2016-09-09 DIAGNOSIS — R630 Anorexia: Secondary | ICD-10-CM | POA: Diagnosis not present

## 2016-09-17 DIAGNOSIS — G309 Alzheimer's disease, unspecified: Secondary | ICD-10-CM | POA: Diagnosis not present

## 2016-09-17 DIAGNOSIS — I10 Essential (primary) hypertension: Secondary | ICD-10-CM | POA: Diagnosis not present

## 2016-09-17 DIAGNOSIS — D649 Anemia, unspecified: Secondary | ICD-10-CM | POA: Diagnosis not present

## 2016-09-17 DIAGNOSIS — E7800 Pure hypercholesterolemia, unspecified: Secondary | ICD-10-CM | POA: Diagnosis not present

## 2016-09-17 DIAGNOSIS — Z9181 History of falling: Secondary | ICD-10-CM | POA: Diagnosis not present

## 2016-09-17 DIAGNOSIS — K219 Gastro-esophageal reflux disease without esophagitis: Secondary | ICD-10-CM | POA: Diagnosis not present

## 2016-09-17 DIAGNOSIS — M8580 Other specified disorders of bone density and structure, unspecified site: Secondary | ICD-10-CM | POA: Diagnosis not present

## 2016-09-17 DIAGNOSIS — E8881 Metabolic syndrome: Secondary | ICD-10-CM | POA: Diagnosis not present

## 2016-09-17 DIAGNOSIS — M159 Polyosteoarthritis, unspecified: Secondary | ICD-10-CM | POA: Diagnosis not present

## 2016-09-17 DIAGNOSIS — R600 Localized edema: Secondary | ICD-10-CM | POA: Diagnosis not present

## 2016-09-17 DIAGNOSIS — F329 Major depressive disorder, single episode, unspecified: Secondary | ICD-10-CM | POA: Diagnosis not present

## 2016-09-17 DIAGNOSIS — G2581 Restless legs syndrome: Secondary | ICD-10-CM | POA: Diagnosis not present

## 2016-09-23 DIAGNOSIS — M216X1 Other acquired deformities of right foot: Secondary | ICD-10-CM | POA: Diagnosis not present

## 2016-09-23 DIAGNOSIS — M79671 Pain in right foot: Secondary | ICD-10-CM | POA: Diagnosis not present

## 2016-09-23 DIAGNOSIS — M19071 Primary osteoarthritis, right ankle and foot: Secondary | ICD-10-CM | POA: Diagnosis not present

## 2016-09-23 DIAGNOSIS — L84 Corns and callosities: Secondary | ICD-10-CM | POA: Diagnosis not present

## 2016-09-24 DIAGNOSIS — M8580 Other specified disorders of bone density and structure, unspecified site: Secondary | ICD-10-CM | POA: Diagnosis not present

## 2016-09-24 DIAGNOSIS — F329 Major depressive disorder, single episode, unspecified: Secondary | ICD-10-CM | POA: Diagnosis not present

## 2016-09-24 DIAGNOSIS — G309 Alzheimer's disease, unspecified: Secondary | ICD-10-CM | POA: Diagnosis not present

## 2016-09-24 DIAGNOSIS — I10 Essential (primary) hypertension: Secondary | ICD-10-CM | POA: Diagnosis not present

## 2016-09-24 DIAGNOSIS — D649 Anemia, unspecified: Secondary | ICD-10-CM | POA: Diagnosis not present

## 2016-09-24 DIAGNOSIS — K219 Gastro-esophageal reflux disease without esophagitis: Secondary | ICD-10-CM | POA: Diagnosis not present

## 2016-09-24 DIAGNOSIS — G2581 Restless legs syndrome: Secondary | ICD-10-CM | POA: Diagnosis not present

## 2016-09-24 DIAGNOSIS — E7800 Pure hypercholesterolemia, unspecified: Secondary | ICD-10-CM | POA: Diagnosis not present

## 2016-09-24 DIAGNOSIS — E8881 Metabolic syndrome: Secondary | ICD-10-CM | POA: Diagnosis not present

## 2016-09-24 DIAGNOSIS — M159 Polyosteoarthritis, unspecified: Secondary | ICD-10-CM | POA: Diagnosis not present

## 2016-09-24 DIAGNOSIS — F419 Anxiety disorder, unspecified: Secondary | ICD-10-CM | POA: Diagnosis not present

## 2016-09-24 DIAGNOSIS — R600 Localized edema: Secondary | ICD-10-CM | POA: Diagnosis not present

## 2016-10-01 DIAGNOSIS — K219 Gastro-esophageal reflux disease without esophagitis: Secondary | ICD-10-CM | POA: Diagnosis not present

## 2016-10-01 DIAGNOSIS — K5730 Diverticulosis of large intestine without perforation or abscess without bleeding: Secondary | ICD-10-CM | POA: Diagnosis not present

## 2016-11-14 DIAGNOSIS — R531 Weakness: Secondary | ICD-10-CM | POA: Diagnosis not present

## 2016-11-14 DIAGNOSIS — F0391 Unspecified dementia with behavioral disturbance: Secondary | ICD-10-CM | POA: Diagnosis not present

## 2016-11-14 DIAGNOSIS — G9340 Encephalopathy, unspecified: Secondary | ICD-10-CM | POA: Diagnosis not present

## 2016-11-14 DIAGNOSIS — N179 Acute kidney failure, unspecified: Secondary | ICD-10-CM | POA: Diagnosis not present

## 2016-11-14 DIAGNOSIS — I6789 Other cerebrovascular disease: Secondary | ICD-10-CM | POA: Diagnosis not present

## 2016-11-14 DIAGNOSIS — R4182 Altered mental status, unspecified: Secondary | ICD-10-CM | POA: Diagnosis not present

## 2016-11-15 DIAGNOSIS — E860 Dehydration: Secondary | ICD-10-CM | POA: Diagnosis present

## 2016-11-15 DIAGNOSIS — G9340 Encephalopathy, unspecified: Secondary | ICD-10-CM | POA: Diagnosis not present

## 2016-11-15 DIAGNOSIS — E876 Hypokalemia: Secondary | ICD-10-CM | POA: Diagnosis not present

## 2016-11-15 DIAGNOSIS — T450X5A Adverse effect of antiallergic and antiemetic drugs, initial encounter: Secondary | ICD-10-CM | POA: Diagnosis present

## 2016-11-15 DIAGNOSIS — R262 Difficulty in walking, not elsewhere classified: Secondary | ICD-10-CM | POA: Diagnosis present

## 2016-11-15 DIAGNOSIS — R4182 Altered mental status, unspecified: Secondary | ICD-10-CM | POA: Diagnosis not present

## 2016-11-15 DIAGNOSIS — I10 Essential (primary) hypertension: Secondary | ICD-10-CM | POA: Diagnosis present

## 2016-11-15 DIAGNOSIS — Z23 Encounter for immunization: Secondary | ICD-10-CM | POA: Diagnosis not present

## 2016-11-15 DIAGNOSIS — R4781 Slurred speech: Secondary | ICD-10-CM | POA: Diagnosis present

## 2016-11-15 DIAGNOSIS — F0391 Unspecified dementia with behavioral disturbance: Secondary | ICD-10-CM | POA: Diagnosis not present

## 2016-11-15 DIAGNOSIS — N179 Acute kidney failure, unspecified: Secondary | ICD-10-CM | POA: Diagnosis not present

## 2016-11-15 DIAGNOSIS — R531 Weakness: Secondary | ICD-10-CM | POA: Diagnosis not present

## 2016-11-15 DIAGNOSIS — G9341 Metabolic encephalopathy: Secondary | ICD-10-CM | POA: Diagnosis present

## 2016-11-15 DIAGNOSIS — Z79899 Other long term (current) drug therapy: Secondary | ICD-10-CM | POA: Diagnosis not present

## 2016-11-18 DIAGNOSIS — T450X5A Adverse effect of antiallergic and antiemetic drugs, initial encounter: Secondary | ICD-10-CM | POA: Diagnosis not present

## 2016-11-18 DIAGNOSIS — F0391 Unspecified dementia with behavioral disturbance: Secondary | ICD-10-CM | POA: Diagnosis not present

## 2016-11-18 DIAGNOSIS — R4781 Slurred speech: Secondary | ICD-10-CM | POA: Diagnosis not present

## 2016-11-18 DIAGNOSIS — F0151 Vascular dementia with behavioral disturbance: Secondary | ICD-10-CM | POA: Diagnosis not present

## 2016-11-18 DIAGNOSIS — N179 Acute kidney failure, unspecified: Secondary | ICD-10-CM | POA: Diagnosis not present

## 2016-11-18 DIAGNOSIS — Z23 Encounter for immunization: Secondary | ICD-10-CM | POA: Diagnosis not present

## 2016-11-18 DIAGNOSIS — G9340 Encephalopathy, unspecified: Secondary | ICD-10-CM | POA: Diagnosis not present

## 2016-11-18 DIAGNOSIS — I10 Essential (primary) hypertension: Secondary | ICD-10-CM | POA: Diagnosis not present

## 2016-11-18 DIAGNOSIS — G9341 Metabolic encephalopathy: Secondary | ICD-10-CM | POA: Diagnosis not present

## 2016-11-18 DIAGNOSIS — G931 Anoxic brain damage, not elsewhere classified: Secondary | ICD-10-CM | POA: Diagnosis not present

## 2016-11-18 DIAGNOSIS — E860 Dehydration: Secondary | ICD-10-CM | POA: Diagnosis not present

## 2016-11-18 DIAGNOSIS — R262 Difficulty in walking, not elsewhere classified: Secondary | ICD-10-CM | POA: Diagnosis not present

## 2016-11-18 DIAGNOSIS — E876 Hypokalemia: Secondary | ICD-10-CM | POA: Diagnosis not present

## 2016-11-18 DIAGNOSIS — Z79899 Other long term (current) drug therapy: Secondary | ICD-10-CM | POA: Diagnosis not present

## 2016-11-19 DIAGNOSIS — N179 Acute kidney failure, unspecified: Secondary | ICD-10-CM | POA: Diagnosis not present

## 2016-11-19 DIAGNOSIS — I10 Essential (primary) hypertension: Secondary | ICD-10-CM | POA: Diagnosis not present

## 2016-11-19 DIAGNOSIS — F0151 Vascular dementia with behavioral disturbance: Secondary | ICD-10-CM | POA: Diagnosis not present

## 2016-11-19 DIAGNOSIS — G9341 Metabolic encephalopathy: Secondary | ICD-10-CM | POA: Diagnosis not present

## 2016-11-26 DIAGNOSIS — G9341 Metabolic encephalopathy: Secondary | ICD-10-CM | POA: Diagnosis not present

## 2016-11-26 DIAGNOSIS — N179 Acute kidney failure, unspecified: Secondary | ICD-10-CM | POA: Diagnosis not present

## 2016-11-26 DIAGNOSIS — F0151 Vascular dementia with behavioral disturbance: Secondary | ICD-10-CM | POA: Diagnosis not present

## 2016-11-26 DIAGNOSIS — I10 Essential (primary) hypertension: Secondary | ICD-10-CM | POA: Diagnosis not present

## 2016-12-03 DIAGNOSIS — N179 Acute kidney failure, unspecified: Secondary | ICD-10-CM | POA: Diagnosis not present

## 2016-12-03 DIAGNOSIS — I10 Essential (primary) hypertension: Secondary | ICD-10-CM | POA: Diagnosis not present

## 2016-12-03 DIAGNOSIS — G9341 Metabolic encephalopathy: Secondary | ICD-10-CM | POA: Diagnosis not present

## 2016-12-03 DIAGNOSIS — F0151 Vascular dementia with behavioral disturbance: Secondary | ICD-10-CM | POA: Diagnosis not present

## 2016-12-11 DIAGNOSIS — F0151 Vascular dementia with behavioral disturbance: Secondary | ICD-10-CM | POA: Diagnosis not present

## 2016-12-11 DIAGNOSIS — G931 Anoxic brain damage, not elsewhere classified: Secondary | ICD-10-CM | POA: Diagnosis not present

## 2016-12-11 DIAGNOSIS — N179 Acute kidney failure, unspecified: Secondary | ICD-10-CM | POA: Diagnosis not present

## 2016-12-11 DIAGNOSIS — I10 Essential (primary) hypertension: Secondary | ICD-10-CM | POA: Diagnosis not present

## 2016-12-17 ENCOUNTER — Other Ambulatory Visit: Payer: Self-pay | Admitting: *Deleted

## 2016-12-17 DIAGNOSIS — F0151 Vascular dementia with behavioral disturbance: Secondary | ICD-10-CM | POA: Diagnosis not present

## 2016-12-17 DIAGNOSIS — N179 Acute kidney failure, unspecified: Secondary | ICD-10-CM | POA: Diagnosis not present

## 2016-12-17 DIAGNOSIS — I10 Essential (primary) hypertension: Secondary | ICD-10-CM | POA: Diagnosis not present

## 2016-12-17 DIAGNOSIS — G9341 Metabolic encephalopathy: Secondary | ICD-10-CM | POA: Diagnosis not present

## 2016-12-17 NOTE — Patient Outreach (Signed)
[facility]) Care Management  12/17/2016  CORRINA STEFFENSEN July 16, 1941 [id number]   Met with Monico Hoar, RN, Discharge planner for facility, patient will be discharging 1/25. She will go home with spouse, home care PT/OT/Nurse set up.  Met with patient in her room, patient states she is ready to go home, spouse not in room. Reviewed [facility] care management program services, patient does not want to sign up today, she did accept a brochure for future reference, left brochure with patient.   Plan to sign off.  Royetta Crochet. Laymond Purser, RN, BSN, [place] [phone]) Business Cell  [phone]) Toll Free Office

## 2016-12-20 DIAGNOSIS — M6281 Muscle weakness (generalized): Secondary | ICD-10-CM | POA: Diagnosis not present

## 2016-12-20 DIAGNOSIS — M1991 Primary osteoarthritis, unspecified site: Secondary | ICD-10-CM | POA: Diagnosis not present

## 2016-12-20 DIAGNOSIS — E785 Hyperlipidemia, unspecified: Secondary | ICD-10-CM | POA: Diagnosis not present

## 2016-12-20 DIAGNOSIS — K219 Gastro-esophageal reflux disease without esophagitis: Secondary | ICD-10-CM | POA: Diagnosis not present

## 2016-12-20 DIAGNOSIS — F419 Anxiety disorder, unspecified: Secondary | ICD-10-CM | POA: Diagnosis not present

## 2016-12-20 DIAGNOSIS — F3341 Major depressive disorder, recurrent, in partial remission: Secondary | ICD-10-CM | POA: Diagnosis not present

## 2016-12-20 DIAGNOSIS — Z9181 History of falling: Secondary | ICD-10-CM | POA: Diagnosis not present

## 2016-12-20 DIAGNOSIS — I10 Essential (primary) hypertension: Secondary | ICD-10-CM | POA: Diagnosis not present

## 2016-12-20 DIAGNOSIS — Z96612 Presence of left artificial shoulder joint: Secondary | ICD-10-CM | POA: Diagnosis not present

## 2016-12-20 DIAGNOSIS — F0151 Vascular dementia with behavioral disturbance: Secondary | ICD-10-CM | POA: Diagnosis not present

## 2016-12-20 DIAGNOSIS — Z96653 Presence of artificial knee joint, bilateral: Secondary | ICD-10-CM | POA: Diagnosis not present

## 2016-12-22 DIAGNOSIS — M6281 Muscle weakness (generalized): Secondary | ICD-10-CM | POA: Diagnosis not present

## 2016-12-22 DIAGNOSIS — F0151 Vascular dementia with behavioral disturbance: Secondary | ICD-10-CM | POA: Diagnosis not present

## 2016-12-22 DIAGNOSIS — F3341 Major depressive disorder, recurrent, in partial remission: Secondary | ICD-10-CM | POA: Diagnosis not present

## 2016-12-22 DIAGNOSIS — I10 Essential (primary) hypertension: Secondary | ICD-10-CM | POA: Diagnosis not present

## 2016-12-22 DIAGNOSIS — F419 Anxiety disorder, unspecified: Secondary | ICD-10-CM | POA: Diagnosis not present

## 2016-12-22 DIAGNOSIS — M1991 Primary osteoarthritis, unspecified site: Secondary | ICD-10-CM | POA: Diagnosis not present

## 2016-12-23 DIAGNOSIS — I10 Essential (primary) hypertension: Secondary | ICD-10-CM | POA: Diagnosis not present

## 2016-12-23 DIAGNOSIS — M1991 Primary osteoarthritis, unspecified site: Secondary | ICD-10-CM | POA: Diagnosis not present

## 2016-12-23 DIAGNOSIS — F3341 Major depressive disorder, recurrent, in partial remission: Secondary | ICD-10-CM | POA: Diagnosis not present

## 2016-12-23 DIAGNOSIS — F0151 Vascular dementia with behavioral disturbance: Secondary | ICD-10-CM | POA: Diagnosis not present

## 2016-12-23 DIAGNOSIS — F419 Anxiety disorder, unspecified: Secondary | ICD-10-CM | POA: Diagnosis not present

## 2016-12-23 DIAGNOSIS — M6281 Muscle weakness (generalized): Secondary | ICD-10-CM | POA: Diagnosis not present

## 2016-12-24 DIAGNOSIS — F3341 Major depressive disorder, recurrent, in partial remission: Secondary | ICD-10-CM | POA: Diagnosis not present

## 2016-12-24 DIAGNOSIS — M6281 Muscle weakness (generalized): Secondary | ICD-10-CM | POA: Diagnosis not present

## 2016-12-24 DIAGNOSIS — F0151 Vascular dementia with behavioral disturbance: Secondary | ICD-10-CM | POA: Diagnosis not present

## 2016-12-24 DIAGNOSIS — M1991 Primary osteoarthritis, unspecified site: Secondary | ICD-10-CM | POA: Diagnosis not present

## 2016-12-24 DIAGNOSIS — F419 Anxiety disorder, unspecified: Secondary | ICD-10-CM | POA: Diagnosis not present

## 2016-12-24 DIAGNOSIS — I10 Essential (primary) hypertension: Secondary | ICD-10-CM | POA: Diagnosis not present

## 2016-12-25 DIAGNOSIS — G309 Alzheimer's disease, unspecified: Secondary | ICD-10-CM | POA: Diagnosis not present

## 2016-12-25 DIAGNOSIS — F419 Anxiety disorder, unspecified: Secondary | ICD-10-CM | POA: Diagnosis not present

## 2016-12-25 DIAGNOSIS — I10 Essential (primary) hypertension: Secondary | ICD-10-CM | POA: Diagnosis not present

## 2016-12-25 DIAGNOSIS — F329 Major depressive disorder, single episode, unspecified: Secondary | ICD-10-CM | POA: Diagnosis not present

## 2016-12-25 DIAGNOSIS — D649 Anemia, unspecified: Secondary | ICD-10-CM | POA: Diagnosis not present

## 2016-12-25 DIAGNOSIS — M8580 Other specified disorders of bone density and structure, unspecified site: Secondary | ICD-10-CM | POA: Diagnosis not present

## 2016-12-25 DIAGNOSIS — M159 Polyosteoarthritis, unspecified: Secondary | ICD-10-CM | POA: Diagnosis not present

## 2016-12-25 DIAGNOSIS — F0151 Vascular dementia with behavioral disturbance: Secondary | ICD-10-CM | POA: Diagnosis not present

## 2016-12-25 DIAGNOSIS — R600 Localized edema: Secondary | ICD-10-CM | POA: Diagnosis not present

## 2016-12-25 DIAGNOSIS — E8881 Metabolic syndrome: Secondary | ICD-10-CM | POA: Diagnosis not present

## 2016-12-25 DIAGNOSIS — M6281 Muscle weakness (generalized): Secondary | ICD-10-CM | POA: Diagnosis not present

## 2016-12-25 DIAGNOSIS — M1991 Primary osteoarthritis, unspecified site: Secondary | ICD-10-CM | POA: Diagnosis not present

## 2016-12-25 DIAGNOSIS — E7800 Pure hypercholesterolemia, unspecified: Secondary | ICD-10-CM | POA: Diagnosis not present

## 2016-12-25 DIAGNOSIS — K219 Gastro-esophageal reflux disease without esophagitis: Secondary | ICD-10-CM | POA: Diagnosis not present

## 2016-12-25 DIAGNOSIS — G2581 Restless legs syndrome: Secondary | ICD-10-CM | POA: Diagnosis not present

## 2016-12-25 DIAGNOSIS — F3341 Major depressive disorder, recurrent, in partial remission: Secondary | ICD-10-CM | POA: Diagnosis not present

## 2016-12-29 DIAGNOSIS — F3341 Major depressive disorder, recurrent, in partial remission: Secondary | ICD-10-CM | POA: Diagnosis not present

## 2016-12-29 DIAGNOSIS — M1991 Primary osteoarthritis, unspecified site: Secondary | ICD-10-CM | POA: Diagnosis not present

## 2016-12-29 DIAGNOSIS — F0151 Vascular dementia with behavioral disturbance: Secondary | ICD-10-CM | POA: Diagnosis not present

## 2016-12-29 DIAGNOSIS — M6281 Muscle weakness (generalized): Secondary | ICD-10-CM | POA: Diagnosis not present

## 2016-12-29 DIAGNOSIS — F419 Anxiety disorder, unspecified: Secondary | ICD-10-CM | POA: Diagnosis not present

## 2016-12-29 DIAGNOSIS — I10 Essential (primary) hypertension: Secondary | ICD-10-CM | POA: Diagnosis not present

## 2016-12-30 DIAGNOSIS — M1991 Primary osteoarthritis, unspecified site: Secondary | ICD-10-CM | POA: Diagnosis not present

## 2016-12-30 DIAGNOSIS — I10 Essential (primary) hypertension: Secondary | ICD-10-CM | POA: Diagnosis not present

## 2016-12-30 DIAGNOSIS — M6281 Muscle weakness (generalized): Secondary | ICD-10-CM | POA: Diagnosis not present

## 2016-12-30 DIAGNOSIS — F3341 Major depressive disorder, recurrent, in partial remission: Secondary | ICD-10-CM | POA: Diagnosis not present

## 2016-12-30 DIAGNOSIS — F419 Anxiety disorder, unspecified: Secondary | ICD-10-CM | POA: Diagnosis not present

## 2016-12-30 DIAGNOSIS — F0151 Vascular dementia with behavioral disturbance: Secondary | ICD-10-CM | POA: Diagnosis not present

## 2017-01-01 DIAGNOSIS — I10 Essential (primary) hypertension: Secondary | ICD-10-CM | POA: Diagnosis not present

## 2017-01-01 DIAGNOSIS — M1991 Primary osteoarthritis, unspecified site: Secondary | ICD-10-CM | POA: Diagnosis not present

## 2017-01-01 DIAGNOSIS — F0151 Vascular dementia with behavioral disturbance: Secondary | ICD-10-CM | POA: Diagnosis not present

## 2017-01-01 DIAGNOSIS — F3341 Major depressive disorder, recurrent, in partial remission: Secondary | ICD-10-CM | POA: Diagnosis not present

## 2017-01-01 DIAGNOSIS — F419 Anxiety disorder, unspecified: Secondary | ICD-10-CM | POA: Diagnosis not present

## 2017-01-01 DIAGNOSIS — M6281 Muscle weakness (generalized): Secondary | ICD-10-CM | POA: Diagnosis not present

## 2017-01-02 DIAGNOSIS — E7800 Pure hypercholesterolemia, unspecified: Secondary | ICD-10-CM | POA: Diagnosis not present

## 2017-01-02 DIAGNOSIS — I10 Essential (primary) hypertension: Secondary | ICD-10-CM | POA: Diagnosis not present

## 2017-01-02 DIAGNOSIS — M159 Polyosteoarthritis, unspecified: Secondary | ICD-10-CM | POA: Diagnosis not present

## 2017-01-02 DIAGNOSIS — F329 Major depressive disorder, single episode, unspecified: Secondary | ICD-10-CM | POA: Diagnosis not present

## 2017-01-02 DIAGNOSIS — R600 Localized edema: Secondary | ICD-10-CM | POA: Diagnosis not present

## 2017-01-02 DIAGNOSIS — K219 Gastro-esophageal reflux disease without esophagitis: Secondary | ICD-10-CM | POA: Diagnosis not present

## 2017-01-02 DIAGNOSIS — G2581 Restless legs syndrome: Secondary | ICD-10-CM | POA: Diagnosis not present

## 2017-01-02 DIAGNOSIS — D649 Anemia, unspecified: Secondary | ICD-10-CM | POA: Diagnosis not present

## 2017-01-02 DIAGNOSIS — F419 Anxiety disorder, unspecified: Secondary | ICD-10-CM | POA: Diagnosis not present

## 2017-01-02 DIAGNOSIS — E8881 Metabolic syndrome: Secondary | ICD-10-CM | POA: Diagnosis not present

## 2017-01-02 DIAGNOSIS — M8580 Other specified disorders of bone density and structure, unspecified site: Secondary | ICD-10-CM | POA: Diagnosis not present

## 2017-01-02 DIAGNOSIS — G309 Alzheimer's disease, unspecified: Secondary | ICD-10-CM | POA: Diagnosis not present

## 2017-01-07 DIAGNOSIS — M1991 Primary osteoarthritis, unspecified site: Secondary | ICD-10-CM | POA: Diagnosis not present

## 2017-01-07 DIAGNOSIS — I10 Essential (primary) hypertension: Secondary | ICD-10-CM | POA: Diagnosis not present

## 2017-01-07 DIAGNOSIS — F3341 Major depressive disorder, recurrent, in partial remission: Secondary | ICD-10-CM | POA: Diagnosis not present

## 2017-01-07 DIAGNOSIS — M6281 Muscle weakness (generalized): Secondary | ICD-10-CM | POA: Diagnosis not present

## 2017-01-07 DIAGNOSIS — F419 Anxiety disorder, unspecified: Secondary | ICD-10-CM | POA: Diagnosis not present

## 2017-01-07 DIAGNOSIS — F0151 Vascular dementia with behavioral disturbance: Secondary | ICD-10-CM | POA: Diagnosis not present

## 2017-01-09 DIAGNOSIS — F419 Anxiety disorder, unspecified: Secondary | ICD-10-CM | POA: Diagnosis not present

## 2017-01-09 DIAGNOSIS — M1991 Primary osteoarthritis, unspecified site: Secondary | ICD-10-CM | POA: Diagnosis not present

## 2017-01-09 DIAGNOSIS — F0151 Vascular dementia with behavioral disturbance: Secondary | ICD-10-CM | POA: Diagnosis not present

## 2017-01-09 DIAGNOSIS — F3341 Major depressive disorder, recurrent, in partial remission: Secondary | ICD-10-CM | POA: Diagnosis not present

## 2017-01-09 DIAGNOSIS — M6281 Muscle weakness (generalized): Secondary | ICD-10-CM | POA: Diagnosis not present

## 2017-01-09 DIAGNOSIS — I10 Essential (primary) hypertension: Secondary | ICD-10-CM | POA: Diagnosis not present

## 2017-01-30 DIAGNOSIS — D649 Anemia, unspecified: Secondary | ICD-10-CM | POA: Diagnosis not present

## 2017-01-30 DIAGNOSIS — F419 Anxiety disorder, unspecified: Secondary | ICD-10-CM | POA: Diagnosis not present

## 2017-01-30 DIAGNOSIS — M8580 Other specified disorders of bone density and structure, unspecified site: Secondary | ICD-10-CM | POA: Diagnosis not present

## 2017-01-30 DIAGNOSIS — K219 Gastro-esophageal reflux disease without esophagitis: Secondary | ICD-10-CM | POA: Diagnosis not present

## 2017-01-30 DIAGNOSIS — R600 Localized edema: Secondary | ICD-10-CM | POA: Diagnosis not present

## 2017-01-30 DIAGNOSIS — F329 Major depressive disorder, single episode, unspecified: Secondary | ICD-10-CM | POA: Diagnosis not present

## 2017-01-30 DIAGNOSIS — G2581 Restless legs syndrome: Secondary | ICD-10-CM | POA: Diagnosis not present

## 2017-01-30 DIAGNOSIS — E8881 Metabolic syndrome: Secondary | ICD-10-CM | POA: Diagnosis not present

## 2017-01-30 DIAGNOSIS — E7800 Pure hypercholesterolemia, unspecified: Secondary | ICD-10-CM | POA: Diagnosis not present

## 2017-01-30 DIAGNOSIS — I10 Essential (primary) hypertension: Secondary | ICD-10-CM | POA: Diagnosis not present

## 2017-01-30 DIAGNOSIS — G309 Alzheimer's disease, unspecified: Secondary | ICD-10-CM | POA: Diagnosis not present

## 2017-01-30 DIAGNOSIS — M159 Polyosteoarthritis, unspecified: Secondary | ICD-10-CM | POA: Diagnosis not present

## 2017-02-25 DIAGNOSIS — Z136 Encounter for screening for cardiovascular disorders: Secondary | ICD-10-CM | POA: Diagnosis not present

## 2017-02-25 DIAGNOSIS — Z9181 History of falling: Secondary | ICD-10-CM | POA: Diagnosis not present

## 2017-02-25 DIAGNOSIS — Z1389 Encounter for screening for other disorder: Secondary | ICD-10-CM | POA: Diagnosis not present

## 2017-02-25 DIAGNOSIS — N959 Unspecified menopausal and perimenopausal disorder: Secondary | ICD-10-CM | POA: Diagnosis not present

## 2017-02-25 DIAGNOSIS — G309 Alzheimer's disease, unspecified: Secondary | ICD-10-CM | POA: Diagnosis not present

## 2017-02-25 DIAGNOSIS — Z0000 Encounter for general adult medical examination without abnormal findings: Secondary | ICD-10-CM | POA: Diagnosis not present

## 2017-02-25 DIAGNOSIS — R928 Other abnormal and inconclusive findings on diagnostic imaging of breast: Secondary | ICD-10-CM | POA: Diagnosis not present

## 2017-02-25 DIAGNOSIS — E7800 Pure hypercholesterolemia, unspecified: Secondary | ICD-10-CM | POA: Diagnosis not present

## 2017-02-27 DIAGNOSIS — G309 Alzheimer's disease, unspecified: Secondary | ICD-10-CM | POA: Diagnosis not present

## 2017-02-27 DIAGNOSIS — E7800 Pure hypercholesterolemia, unspecified: Secondary | ICD-10-CM | POA: Diagnosis not present

## 2017-02-27 DIAGNOSIS — M8580 Other specified disorders of bone density and structure, unspecified site: Secondary | ICD-10-CM | POA: Diagnosis not present

## 2017-02-27 DIAGNOSIS — M159 Polyosteoarthritis, unspecified: Secondary | ICD-10-CM | POA: Diagnosis not present

## 2017-02-27 DIAGNOSIS — R600 Localized edema: Secondary | ICD-10-CM | POA: Diagnosis not present

## 2017-02-27 DIAGNOSIS — F329 Major depressive disorder, single episode, unspecified: Secondary | ICD-10-CM | POA: Diagnosis not present

## 2017-02-27 DIAGNOSIS — D649 Anemia, unspecified: Secondary | ICD-10-CM | POA: Diagnosis not present

## 2017-02-27 DIAGNOSIS — E8881 Metabolic syndrome: Secondary | ICD-10-CM | POA: Diagnosis not present

## 2017-02-27 DIAGNOSIS — K219 Gastro-esophageal reflux disease without esophagitis: Secondary | ICD-10-CM | POA: Diagnosis not present

## 2017-02-27 DIAGNOSIS — F419 Anxiety disorder, unspecified: Secondary | ICD-10-CM | POA: Diagnosis not present

## 2017-02-27 DIAGNOSIS — G2581 Restless legs syndrome: Secondary | ICD-10-CM | POA: Diagnosis not present

## 2017-02-27 DIAGNOSIS — I10 Essential (primary) hypertension: Secondary | ICD-10-CM | POA: Diagnosis not present

## 2017-03-16 DIAGNOSIS — N6314 Unspecified lump in the right breast, lower inner quadrant: Secondary | ICD-10-CM | POA: Diagnosis not present

## 2017-03-16 DIAGNOSIS — N6002 Solitary cyst of left breast: Secondary | ICD-10-CM | POA: Diagnosis not present

## 2017-03-16 DIAGNOSIS — N6321 Unspecified lump in the left breast, upper outer quadrant: Secondary | ICD-10-CM | POA: Diagnosis not present

## 2017-03-16 DIAGNOSIS — N6001 Solitary cyst of right breast: Secondary | ICD-10-CM | POA: Diagnosis not present

## 2017-03-16 DIAGNOSIS — M810 Age-related osteoporosis without current pathological fracture: Secondary | ICD-10-CM | POA: Diagnosis not present

## 2017-03-16 DIAGNOSIS — N959 Unspecified menopausal and perimenopausal disorder: Secondary | ICD-10-CM | POA: Diagnosis not present

## 2017-03-27 DIAGNOSIS — F329 Major depressive disorder, single episode, unspecified: Secondary | ICD-10-CM | POA: Diagnosis not present

## 2017-03-27 DIAGNOSIS — M159 Polyosteoarthritis, unspecified: Secondary | ICD-10-CM | POA: Diagnosis not present

## 2017-03-27 DIAGNOSIS — F419 Anxiety disorder, unspecified: Secondary | ICD-10-CM | POA: Diagnosis not present

## 2017-03-27 DIAGNOSIS — K219 Gastro-esophageal reflux disease without esophagitis: Secondary | ICD-10-CM | POA: Diagnosis not present

## 2017-03-27 DIAGNOSIS — G309 Alzheimer's disease, unspecified: Secondary | ICD-10-CM | POA: Diagnosis not present

## 2017-03-27 DIAGNOSIS — G2581 Restless legs syndrome: Secondary | ICD-10-CM | POA: Diagnosis not present

## 2017-03-27 DIAGNOSIS — E7800 Pure hypercholesterolemia, unspecified: Secondary | ICD-10-CM | POA: Diagnosis not present

## 2017-03-27 DIAGNOSIS — E8881 Metabolic syndrome: Secondary | ICD-10-CM | POA: Diagnosis not present

## 2017-03-27 DIAGNOSIS — I10 Essential (primary) hypertension: Secondary | ICD-10-CM | POA: Diagnosis not present

## 2017-03-27 DIAGNOSIS — R600 Localized edema: Secondary | ICD-10-CM | POA: Diagnosis not present

## 2017-03-27 DIAGNOSIS — M8580 Other specified disorders of bone density and structure, unspecified site: Secondary | ICD-10-CM | POA: Diagnosis not present

## 2017-03-27 DIAGNOSIS — D649 Anemia, unspecified: Secondary | ICD-10-CM | POA: Diagnosis not present

## 2017-04-07 DIAGNOSIS — Z6825 Body mass index (BMI) 25.0-25.9, adult: Secondary | ICD-10-CM | POA: Diagnosis not present

## 2017-04-07 DIAGNOSIS — M159 Polyosteoarthritis, unspecified: Secondary | ICD-10-CM | POA: Diagnosis not present

## 2017-04-07 DIAGNOSIS — E7800 Pure hypercholesterolemia, unspecified: Secondary | ICD-10-CM | POA: Diagnosis not present

## 2017-04-07 DIAGNOSIS — F419 Anxiety disorder, unspecified: Secondary | ICD-10-CM | POA: Diagnosis not present

## 2017-04-07 DIAGNOSIS — J209 Acute bronchitis, unspecified: Secondary | ICD-10-CM | POA: Diagnosis not present

## 2017-04-07 DIAGNOSIS — E8881 Metabolic syndrome: Secondary | ICD-10-CM | POA: Diagnosis not present

## 2017-04-07 DIAGNOSIS — I10 Essential (primary) hypertension: Secondary | ICD-10-CM | POA: Diagnosis not present

## 2017-04-07 DIAGNOSIS — R600 Localized edema: Secondary | ICD-10-CM | POA: Diagnosis not present

## 2017-04-07 DIAGNOSIS — G309 Alzheimer's disease, unspecified: Secondary | ICD-10-CM | POA: Diagnosis not present

## 2017-04-07 DIAGNOSIS — F329 Major depressive disorder, single episode, unspecified: Secondary | ICD-10-CM | POA: Diagnosis not present

## 2017-04-17 DIAGNOSIS — E8881 Metabolic syndrome: Secondary | ICD-10-CM | POA: Diagnosis not present

## 2017-04-17 DIAGNOSIS — N289 Disorder of kidney and ureter, unspecified: Secondary | ICD-10-CM | POA: Diagnosis not present

## 2017-04-17 DIAGNOSIS — G309 Alzheimer's disease, unspecified: Secondary | ICD-10-CM | POA: Diagnosis not present

## 2017-04-17 DIAGNOSIS — M159 Polyosteoarthritis, unspecified: Secondary | ICD-10-CM | POA: Diagnosis not present

## 2017-04-17 DIAGNOSIS — M8580 Other specified disorders of bone density and structure, unspecified site: Secondary | ICD-10-CM | POA: Diagnosis not present

## 2017-04-17 DIAGNOSIS — F419 Anxiety disorder, unspecified: Secondary | ICD-10-CM | POA: Diagnosis not present

## 2017-04-17 DIAGNOSIS — F329 Major depressive disorder, single episode, unspecified: Secondary | ICD-10-CM | POA: Diagnosis not present

## 2017-04-17 DIAGNOSIS — G2581 Restless legs syndrome: Secondary | ICD-10-CM | POA: Diagnosis not present

## 2017-04-17 DIAGNOSIS — I10 Essential (primary) hypertension: Secondary | ICD-10-CM | POA: Diagnosis not present

## 2017-04-17 DIAGNOSIS — K219 Gastro-esophageal reflux disease without esophagitis: Secondary | ICD-10-CM | POA: Diagnosis not present

## 2017-04-17 DIAGNOSIS — R600 Localized edema: Secondary | ICD-10-CM | POA: Diagnosis not present

## 2017-04-17 DIAGNOSIS — E7800 Pure hypercholesterolemia, unspecified: Secondary | ICD-10-CM | POA: Diagnosis not present

## 2017-04-24 DIAGNOSIS — M8580 Other specified disorders of bone density and structure, unspecified site: Secondary | ICD-10-CM | POA: Diagnosis not present

## 2017-04-24 DIAGNOSIS — E8881 Metabolic syndrome: Secondary | ICD-10-CM | POA: Diagnosis not present

## 2017-04-24 DIAGNOSIS — E7800 Pure hypercholesterolemia, unspecified: Secondary | ICD-10-CM | POA: Diagnosis not present

## 2017-04-24 DIAGNOSIS — G309 Alzheimer's disease, unspecified: Secondary | ICD-10-CM | POA: Diagnosis not present

## 2017-04-24 DIAGNOSIS — F419 Anxiety disorder, unspecified: Secondary | ICD-10-CM | POA: Diagnosis not present

## 2017-04-24 DIAGNOSIS — F329 Major depressive disorder, single episode, unspecified: Secondary | ICD-10-CM | POA: Diagnosis not present

## 2017-04-24 DIAGNOSIS — G2581 Restless legs syndrome: Secondary | ICD-10-CM | POA: Diagnosis not present

## 2017-04-24 DIAGNOSIS — I10 Essential (primary) hypertension: Secondary | ICD-10-CM | POA: Diagnosis not present

## 2017-04-24 DIAGNOSIS — R600 Localized edema: Secondary | ICD-10-CM | POA: Diagnosis not present

## 2017-04-24 DIAGNOSIS — M159 Polyosteoarthritis, unspecified: Secondary | ICD-10-CM | POA: Diagnosis not present

## 2017-04-24 DIAGNOSIS — K219 Gastro-esophageal reflux disease without esophagitis: Secondary | ICD-10-CM | POA: Diagnosis not present

## 2017-04-24 DIAGNOSIS — J209 Acute bronchitis, unspecified: Secondary | ICD-10-CM | POA: Diagnosis not present

## 2017-05-01 DIAGNOSIS — D649 Anemia, unspecified: Secondary | ICD-10-CM | POA: Diagnosis not present

## 2017-05-01 DIAGNOSIS — E8881 Metabolic syndrome: Secondary | ICD-10-CM | POA: Diagnosis not present

## 2017-05-01 DIAGNOSIS — G309 Alzheimer's disease, unspecified: Secondary | ICD-10-CM | POA: Diagnosis not present

## 2017-05-01 DIAGNOSIS — G2581 Restless legs syndrome: Secondary | ICD-10-CM | POA: Diagnosis not present

## 2017-05-01 DIAGNOSIS — K219 Gastro-esophageal reflux disease without esophagitis: Secondary | ICD-10-CM | POA: Diagnosis not present

## 2017-05-01 DIAGNOSIS — M8580 Other specified disorders of bone density and structure, unspecified site: Secondary | ICD-10-CM | POA: Diagnosis not present

## 2017-05-01 DIAGNOSIS — M159 Polyosteoarthritis, unspecified: Secondary | ICD-10-CM | POA: Diagnosis not present

## 2017-05-01 DIAGNOSIS — F329 Major depressive disorder, single episode, unspecified: Secondary | ICD-10-CM | POA: Diagnosis not present

## 2017-05-01 DIAGNOSIS — E7800 Pure hypercholesterolemia, unspecified: Secondary | ICD-10-CM | POA: Diagnosis not present

## 2017-05-01 DIAGNOSIS — F419 Anxiety disorder, unspecified: Secondary | ICD-10-CM | POA: Diagnosis not present

## 2017-05-01 DIAGNOSIS — R600 Localized edema: Secondary | ICD-10-CM | POA: Diagnosis not present

## 2017-05-01 DIAGNOSIS — I10 Essential (primary) hypertension: Secondary | ICD-10-CM | POA: Diagnosis not present

## 2017-05-29 DIAGNOSIS — I10 Essential (primary) hypertension: Secondary | ICD-10-CM | POA: Diagnosis not present

## 2017-05-29 DIAGNOSIS — E8881 Metabolic syndrome: Secondary | ICD-10-CM | POA: Diagnosis not present

## 2017-05-29 DIAGNOSIS — F329 Major depressive disorder, single episode, unspecified: Secondary | ICD-10-CM | POA: Diagnosis not present

## 2017-05-29 DIAGNOSIS — F419 Anxiety disorder, unspecified: Secondary | ICD-10-CM | POA: Diagnosis not present

## 2017-05-29 DIAGNOSIS — R600 Localized edema: Secondary | ICD-10-CM | POA: Diagnosis not present

## 2017-05-29 DIAGNOSIS — M159 Polyosteoarthritis, unspecified: Secondary | ICD-10-CM | POA: Diagnosis not present

## 2017-05-29 DIAGNOSIS — R112 Nausea with vomiting, unspecified: Secondary | ICD-10-CM | POA: Diagnosis not present

## 2017-05-29 DIAGNOSIS — G309 Alzheimer's disease, unspecified: Secondary | ICD-10-CM | POA: Diagnosis not present

## 2017-05-29 DIAGNOSIS — Z6825 Body mass index (BMI) 25.0-25.9, adult: Secondary | ICD-10-CM | POA: Diagnosis not present

## 2017-05-29 DIAGNOSIS — E7800 Pure hypercholesterolemia, unspecified: Secondary | ICD-10-CM | POA: Diagnosis not present

## 2017-06-04 DIAGNOSIS — I10 Essential (primary) hypertension: Secondary | ICD-10-CM | POA: Diagnosis not present

## 2017-06-04 DIAGNOSIS — G2581 Restless legs syndrome: Secondary | ICD-10-CM | POA: Diagnosis not present

## 2017-06-04 DIAGNOSIS — D649 Anemia, unspecified: Secondary | ICD-10-CM | POA: Diagnosis not present

## 2017-06-04 DIAGNOSIS — M159 Polyosteoarthritis, unspecified: Secondary | ICD-10-CM | POA: Diagnosis not present

## 2017-06-04 DIAGNOSIS — F419 Anxiety disorder, unspecified: Secondary | ICD-10-CM | POA: Diagnosis not present

## 2017-06-04 DIAGNOSIS — R600 Localized edema: Secondary | ICD-10-CM | POA: Diagnosis not present

## 2017-06-04 DIAGNOSIS — K219 Gastro-esophageal reflux disease without esophagitis: Secondary | ICD-10-CM | POA: Diagnosis not present

## 2017-06-04 DIAGNOSIS — F329 Major depressive disorder, single episode, unspecified: Secondary | ICD-10-CM | POA: Diagnosis not present

## 2017-06-04 DIAGNOSIS — G309 Alzheimer's disease, unspecified: Secondary | ICD-10-CM | POA: Diagnosis not present

## 2017-06-04 DIAGNOSIS — M8580 Other specified disorders of bone density and structure, unspecified site: Secondary | ICD-10-CM | POA: Diagnosis not present

## 2017-06-04 DIAGNOSIS — E8881 Metabolic syndrome: Secondary | ICD-10-CM | POA: Diagnosis not present

## 2017-06-04 DIAGNOSIS — E7800 Pure hypercholesterolemia, unspecified: Secondary | ICD-10-CM | POA: Diagnosis not present

## 2017-06-19 DIAGNOSIS — I10 Essential (primary) hypertension: Secondary | ICD-10-CM | POA: Diagnosis not present

## 2017-06-19 DIAGNOSIS — K219 Gastro-esophageal reflux disease without esophagitis: Secondary | ICD-10-CM | POA: Diagnosis not present

## 2017-06-19 DIAGNOSIS — M8580 Other specified disorders of bone density and structure, unspecified site: Secondary | ICD-10-CM | POA: Diagnosis not present

## 2017-06-19 DIAGNOSIS — F329 Major depressive disorder, single episode, unspecified: Secondary | ICD-10-CM | POA: Diagnosis not present

## 2017-06-19 DIAGNOSIS — M159 Polyosteoarthritis, unspecified: Secondary | ICD-10-CM | POA: Diagnosis not present

## 2017-06-19 DIAGNOSIS — F419 Anxiety disorder, unspecified: Secondary | ICD-10-CM | POA: Diagnosis not present

## 2017-06-19 DIAGNOSIS — D649 Anemia, unspecified: Secondary | ICD-10-CM | POA: Diagnosis not present

## 2017-06-19 DIAGNOSIS — E8881 Metabolic syndrome: Secondary | ICD-10-CM | POA: Diagnosis not present

## 2017-06-19 DIAGNOSIS — G2581 Restless legs syndrome: Secondary | ICD-10-CM | POA: Diagnosis not present

## 2017-06-19 DIAGNOSIS — E7800 Pure hypercholesterolemia, unspecified: Secondary | ICD-10-CM | POA: Diagnosis not present

## 2017-06-19 DIAGNOSIS — G309 Alzheimer's disease, unspecified: Secondary | ICD-10-CM | POA: Diagnosis not present

## 2017-06-19 DIAGNOSIS — R600 Localized edema: Secondary | ICD-10-CM | POA: Diagnosis not present

## 2017-07-20 DIAGNOSIS — F419 Anxiety disorder, unspecified: Secondary | ICD-10-CM | POA: Diagnosis not present

## 2017-07-20 DIAGNOSIS — E8881 Metabolic syndrome: Secondary | ICD-10-CM | POA: Diagnosis not present

## 2017-07-20 DIAGNOSIS — G309 Alzheimer's disease, unspecified: Secondary | ICD-10-CM | POA: Diagnosis not present

## 2017-07-20 DIAGNOSIS — K219 Gastro-esophageal reflux disease without esophagitis: Secondary | ICD-10-CM | POA: Diagnosis not present

## 2017-07-20 DIAGNOSIS — I10 Essential (primary) hypertension: Secondary | ICD-10-CM | POA: Diagnosis not present

## 2017-07-20 DIAGNOSIS — F329 Major depressive disorder, single episode, unspecified: Secondary | ICD-10-CM | POA: Diagnosis not present

## 2017-07-20 DIAGNOSIS — R600 Localized edema: Secondary | ICD-10-CM | POA: Diagnosis not present

## 2017-07-20 DIAGNOSIS — D649 Anemia, unspecified: Secondary | ICD-10-CM | POA: Diagnosis not present

## 2017-07-20 DIAGNOSIS — M8580 Other specified disorders of bone density and structure, unspecified site: Secondary | ICD-10-CM | POA: Diagnosis not present

## 2017-07-20 DIAGNOSIS — E7800 Pure hypercholesterolemia, unspecified: Secondary | ICD-10-CM | POA: Diagnosis not present

## 2017-07-20 DIAGNOSIS — G2581 Restless legs syndrome: Secondary | ICD-10-CM | POA: Diagnosis not present

## 2017-07-20 DIAGNOSIS — M159 Polyosteoarthritis, unspecified: Secondary | ICD-10-CM | POA: Diagnosis not present

## 2017-08-21 DIAGNOSIS — F329 Major depressive disorder, single episode, unspecified: Secondary | ICD-10-CM | POA: Diagnosis not present

## 2017-08-21 DIAGNOSIS — M8580 Other specified disorders of bone density and structure, unspecified site: Secondary | ICD-10-CM | POA: Diagnosis not present

## 2017-08-21 DIAGNOSIS — Z23 Encounter for immunization: Secondary | ICD-10-CM | POA: Diagnosis not present

## 2017-08-21 DIAGNOSIS — G309 Alzheimer's disease, unspecified: Secondary | ICD-10-CM | POA: Diagnosis not present

## 2017-08-21 DIAGNOSIS — F419 Anxiety disorder, unspecified: Secondary | ICD-10-CM | POA: Diagnosis not present

## 2017-08-21 DIAGNOSIS — K219 Gastro-esophageal reflux disease without esophagitis: Secondary | ICD-10-CM | POA: Diagnosis not present

## 2017-08-21 DIAGNOSIS — E8881 Metabolic syndrome: Secondary | ICD-10-CM | POA: Diagnosis not present

## 2017-08-21 DIAGNOSIS — R600 Localized edema: Secondary | ICD-10-CM | POA: Diagnosis not present

## 2017-08-21 DIAGNOSIS — I10 Essential (primary) hypertension: Secondary | ICD-10-CM | POA: Diagnosis not present

## 2017-08-21 DIAGNOSIS — E7800 Pure hypercholesterolemia, unspecified: Secondary | ICD-10-CM | POA: Diagnosis not present

## 2017-08-21 DIAGNOSIS — M159 Polyosteoarthritis, unspecified: Secondary | ICD-10-CM | POA: Diagnosis not present

## 2017-08-21 DIAGNOSIS — G2581 Restless legs syndrome: Secondary | ICD-10-CM | POA: Diagnosis not present

## 2017-09-21 DIAGNOSIS — E7800 Pure hypercholesterolemia, unspecified: Secondary | ICD-10-CM | POA: Diagnosis not present

## 2017-09-21 DIAGNOSIS — G309 Alzheimer's disease, unspecified: Secondary | ICD-10-CM | POA: Diagnosis not present

## 2017-09-21 DIAGNOSIS — F419 Anxiety disorder, unspecified: Secondary | ICD-10-CM | POA: Diagnosis not present

## 2017-09-21 DIAGNOSIS — K219 Gastro-esophageal reflux disease without esophagitis: Secondary | ICD-10-CM | POA: Diagnosis not present

## 2017-09-21 DIAGNOSIS — R600 Localized edema: Secondary | ICD-10-CM | POA: Diagnosis not present

## 2017-09-21 DIAGNOSIS — D649 Anemia, unspecified: Secondary | ICD-10-CM | POA: Diagnosis not present

## 2017-09-21 DIAGNOSIS — F329 Major depressive disorder, single episode, unspecified: Secondary | ICD-10-CM | POA: Diagnosis not present

## 2017-09-21 DIAGNOSIS — I10 Essential (primary) hypertension: Secondary | ICD-10-CM | POA: Diagnosis not present

## 2017-09-21 DIAGNOSIS — E8881 Metabolic syndrome: Secondary | ICD-10-CM | POA: Diagnosis not present

## 2017-09-21 DIAGNOSIS — M8580 Other specified disorders of bone density and structure, unspecified site: Secondary | ICD-10-CM | POA: Diagnosis not present

## 2017-09-21 DIAGNOSIS — G2581 Restless legs syndrome: Secondary | ICD-10-CM | POA: Diagnosis not present

## 2017-09-21 DIAGNOSIS — M159 Polyosteoarthritis, unspecified: Secondary | ICD-10-CM | POA: Diagnosis not present

## 2017-10-27 DIAGNOSIS — I10 Essential (primary) hypertension: Secondary | ICD-10-CM | POA: Diagnosis not present

## 2017-10-27 DIAGNOSIS — G309 Alzheimer's disease, unspecified: Secondary | ICD-10-CM | POA: Diagnosis not present

## 2017-10-27 DIAGNOSIS — M159 Polyosteoarthritis, unspecified: Secondary | ICD-10-CM | POA: Diagnosis not present

## 2017-10-27 DIAGNOSIS — F419 Anxiety disorder, unspecified: Secondary | ICD-10-CM | POA: Diagnosis not present

## 2017-10-27 DIAGNOSIS — K219 Gastro-esophageal reflux disease without esophagitis: Secondary | ICD-10-CM | POA: Diagnosis not present

## 2017-10-27 DIAGNOSIS — G2581 Restless legs syndrome: Secondary | ICD-10-CM | POA: Diagnosis not present

## 2017-10-27 DIAGNOSIS — R600 Localized edema: Secondary | ICD-10-CM | POA: Diagnosis not present

## 2017-10-27 DIAGNOSIS — F329 Major depressive disorder, single episode, unspecified: Secondary | ICD-10-CM | POA: Diagnosis not present

## 2017-10-27 DIAGNOSIS — M8580 Other specified disorders of bone density and structure, unspecified site: Secondary | ICD-10-CM | POA: Diagnosis not present

## 2017-10-27 DIAGNOSIS — E7800 Pure hypercholesterolemia, unspecified: Secondary | ICD-10-CM | POA: Diagnosis not present

## 2017-10-27 DIAGNOSIS — D649 Anemia, unspecified: Secondary | ICD-10-CM | POA: Diagnosis not present

## 2017-10-27 DIAGNOSIS — E8881 Metabolic syndrome: Secondary | ICD-10-CM | POA: Diagnosis not present

## 2017-12-01 DIAGNOSIS — K219 Gastro-esophageal reflux disease without esophagitis: Secondary | ICD-10-CM | POA: Diagnosis not present

## 2017-12-01 DIAGNOSIS — I10 Essential (primary) hypertension: Secondary | ICD-10-CM | POA: Diagnosis not present

## 2017-12-01 DIAGNOSIS — M8580 Other specified disorders of bone density and structure, unspecified site: Secondary | ICD-10-CM | POA: Diagnosis not present

## 2017-12-01 DIAGNOSIS — G309 Alzheimer's disease, unspecified: Secondary | ICD-10-CM | POA: Diagnosis not present

## 2017-12-01 DIAGNOSIS — F419 Anxiety disorder, unspecified: Secondary | ICD-10-CM | POA: Diagnosis not present

## 2017-12-01 DIAGNOSIS — F329 Major depressive disorder, single episode, unspecified: Secondary | ICD-10-CM | POA: Diagnosis not present

## 2017-12-01 DIAGNOSIS — E8881 Metabolic syndrome: Secondary | ICD-10-CM | POA: Diagnosis not present

## 2017-12-01 DIAGNOSIS — D649 Anemia, unspecified: Secondary | ICD-10-CM | POA: Diagnosis not present

## 2017-12-01 DIAGNOSIS — R600 Localized edema: Secondary | ICD-10-CM | POA: Diagnosis not present

## 2017-12-01 DIAGNOSIS — M159 Polyosteoarthritis, unspecified: Secondary | ICD-10-CM | POA: Diagnosis not present

## 2017-12-01 DIAGNOSIS — E7800 Pure hypercholesterolemia, unspecified: Secondary | ICD-10-CM | POA: Diagnosis not present

## 2017-12-01 DIAGNOSIS — G2581 Restless legs syndrome: Secondary | ICD-10-CM | POA: Diagnosis not present

## 2018-01-01 DIAGNOSIS — E8881 Metabolic syndrome: Secondary | ICD-10-CM | POA: Diagnosis not present

## 2018-01-01 DIAGNOSIS — K219 Gastro-esophageal reflux disease without esophagitis: Secondary | ICD-10-CM | POA: Diagnosis not present

## 2018-01-01 DIAGNOSIS — D649 Anemia, unspecified: Secondary | ICD-10-CM | POA: Diagnosis not present

## 2018-01-01 DIAGNOSIS — R600 Localized edema: Secondary | ICD-10-CM | POA: Diagnosis not present

## 2018-01-01 DIAGNOSIS — M8580 Other specified disorders of bone density and structure, unspecified site: Secondary | ICD-10-CM | POA: Diagnosis not present

## 2018-01-01 DIAGNOSIS — M159 Polyosteoarthritis, unspecified: Secondary | ICD-10-CM | POA: Diagnosis not present

## 2018-01-01 DIAGNOSIS — G309 Alzheimer's disease, unspecified: Secondary | ICD-10-CM | POA: Diagnosis not present

## 2018-01-01 DIAGNOSIS — F329 Major depressive disorder, single episode, unspecified: Secondary | ICD-10-CM | POA: Diagnosis not present

## 2018-01-01 DIAGNOSIS — F419 Anxiety disorder, unspecified: Secondary | ICD-10-CM | POA: Diagnosis not present

## 2018-01-01 DIAGNOSIS — I10 Essential (primary) hypertension: Secondary | ICD-10-CM | POA: Diagnosis not present

## 2018-01-01 DIAGNOSIS — G2581 Restless legs syndrome: Secondary | ICD-10-CM | POA: Diagnosis not present

## 2018-01-01 DIAGNOSIS — E7800 Pure hypercholesterolemia, unspecified: Secondary | ICD-10-CM | POA: Diagnosis not present

## 2018-01-19 DIAGNOSIS — H26492 Other secondary cataract, left eye: Secondary | ICD-10-CM | POA: Diagnosis not present

## 2018-01-19 DIAGNOSIS — H401131 Primary open-angle glaucoma, bilateral, mild stage: Secondary | ICD-10-CM | POA: Diagnosis not present

## 2018-01-19 DIAGNOSIS — H25811 Combined forms of age-related cataract, right eye: Secondary | ICD-10-CM | POA: Diagnosis not present

## 2018-01-19 DIAGNOSIS — H524 Presbyopia: Secondary | ICD-10-CM | POA: Diagnosis not present

## 2018-01-28 DIAGNOSIS — R600 Localized edema: Secondary | ICD-10-CM | POA: Diagnosis not present

## 2018-01-28 DIAGNOSIS — G309 Alzheimer's disease, unspecified: Secondary | ICD-10-CM | POA: Diagnosis not present

## 2018-01-28 DIAGNOSIS — M8580 Other specified disorders of bone density and structure, unspecified site: Secondary | ICD-10-CM | POA: Diagnosis not present

## 2018-01-28 DIAGNOSIS — J208 Acute bronchitis due to other specified organisms: Secondary | ICD-10-CM | POA: Diagnosis not present

## 2018-01-28 DIAGNOSIS — E8881 Metabolic syndrome: Secondary | ICD-10-CM | POA: Diagnosis not present

## 2018-01-28 DIAGNOSIS — M159 Polyosteoarthritis, unspecified: Secondary | ICD-10-CM | POA: Diagnosis not present

## 2018-01-28 DIAGNOSIS — F329 Major depressive disorder, single episode, unspecified: Secondary | ICD-10-CM | POA: Diagnosis not present

## 2018-01-28 DIAGNOSIS — I10 Essential (primary) hypertension: Secondary | ICD-10-CM | POA: Diagnosis not present

## 2018-01-28 DIAGNOSIS — G2581 Restless legs syndrome: Secondary | ICD-10-CM | POA: Diagnosis not present

## 2018-01-28 DIAGNOSIS — F419 Anxiety disorder, unspecified: Secondary | ICD-10-CM | POA: Diagnosis not present

## 2018-01-28 DIAGNOSIS — E7800 Pure hypercholesterolemia, unspecified: Secondary | ICD-10-CM | POA: Diagnosis not present

## 2018-01-28 DIAGNOSIS — K219 Gastro-esophageal reflux disease without esophagitis: Secondary | ICD-10-CM | POA: Diagnosis not present

## 2018-02-17 DIAGNOSIS — I8310 Varicose veins of unspecified lower extremity with inflammation: Secondary | ICD-10-CM | POA: Diagnosis not present

## 2018-02-17 DIAGNOSIS — L299 Pruritus, unspecified: Secondary | ICD-10-CM | POA: Diagnosis not present

## 2018-03-01 DIAGNOSIS — D649 Anemia, unspecified: Secondary | ICD-10-CM | POA: Diagnosis not present

## 2018-03-01 DIAGNOSIS — F329 Major depressive disorder, single episode, unspecified: Secondary | ICD-10-CM | POA: Diagnosis not present

## 2018-03-01 DIAGNOSIS — E8881 Metabolic syndrome: Secondary | ICD-10-CM | POA: Diagnosis not present

## 2018-03-01 DIAGNOSIS — M8580 Other specified disorders of bone density and structure, unspecified site: Secondary | ICD-10-CM | POA: Diagnosis not present

## 2018-03-01 DIAGNOSIS — K219 Gastro-esophageal reflux disease without esophagitis: Secondary | ICD-10-CM | POA: Diagnosis not present

## 2018-03-01 DIAGNOSIS — Z9181 History of falling: Secondary | ICD-10-CM | POA: Diagnosis not present

## 2018-03-01 DIAGNOSIS — R600 Localized edema: Secondary | ICD-10-CM | POA: Diagnosis not present

## 2018-03-01 DIAGNOSIS — G2581 Restless legs syndrome: Secondary | ICD-10-CM | POA: Diagnosis not present

## 2018-03-01 DIAGNOSIS — I10 Essential (primary) hypertension: Secondary | ICD-10-CM | POA: Diagnosis not present

## 2018-03-01 DIAGNOSIS — G309 Alzheimer's disease, unspecified: Secondary | ICD-10-CM | POA: Diagnosis not present

## 2018-03-01 DIAGNOSIS — E7800 Pure hypercholesterolemia, unspecified: Secondary | ICD-10-CM | POA: Diagnosis not present

## 2018-03-01 DIAGNOSIS — F419 Anxiety disorder, unspecified: Secondary | ICD-10-CM | POA: Diagnosis not present

## 2018-03-01 DIAGNOSIS — M159 Polyosteoarthritis, unspecified: Secondary | ICD-10-CM | POA: Diagnosis not present

## 2018-03-31 DIAGNOSIS — F329 Major depressive disorder, single episode, unspecified: Secondary | ICD-10-CM | POA: Diagnosis not present

## 2018-03-31 DIAGNOSIS — F419 Anxiety disorder, unspecified: Secondary | ICD-10-CM | POA: Diagnosis not present

## 2018-03-31 DIAGNOSIS — M159 Polyosteoarthritis, unspecified: Secondary | ICD-10-CM | POA: Diagnosis not present

## 2018-03-31 DIAGNOSIS — E8881 Metabolic syndrome: Secondary | ICD-10-CM | POA: Diagnosis not present

## 2018-05-11 DIAGNOSIS — E8881 Metabolic syndrome: Secondary | ICD-10-CM | POA: Diagnosis not present

## 2018-05-11 DIAGNOSIS — M159 Polyosteoarthritis, unspecified: Secondary | ICD-10-CM | POA: Diagnosis not present

## 2018-05-11 DIAGNOSIS — F419 Anxiety disorder, unspecified: Secondary | ICD-10-CM | POA: Diagnosis not present

## 2018-05-11 DIAGNOSIS — F329 Major depressive disorder, single episode, unspecified: Secondary | ICD-10-CM | POA: Diagnosis not present

## 2018-05-20 DIAGNOSIS — H401131 Primary open-angle glaucoma, bilateral, mild stage: Secondary | ICD-10-CM | POA: Diagnosis not present

## 2018-06-11 DIAGNOSIS — L0100 Impetigo, unspecified: Secondary | ICD-10-CM | POA: Diagnosis not present

## 2018-06-11 DIAGNOSIS — L299 Pruritus, unspecified: Secondary | ICD-10-CM | POA: Diagnosis not present

## 2018-06-11 DIAGNOSIS — I8310 Varicose veins of unspecified lower extremity with inflammation: Secondary | ICD-10-CM | POA: Diagnosis not present

## 2018-06-16 DIAGNOSIS — I10 Essential (primary) hypertension: Secondary | ICD-10-CM | POA: Diagnosis not present

## 2018-06-16 DIAGNOSIS — D649 Anemia, unspecified: Secondary | ICD-10-CM | POA: Diagnosis not present

## 2018-06-16 DIAGNOSIS — F419 Anxiety disorder, unspecified: Secondary | ICD-10-CM | POA: Diagnosis not present

## 2018-06-16 DIAGNOSIS — M159 Polyosteoarthritis, unspecified: Secondary | ICD-10-CM | POA: Diagnosis not present

## 2018-06-16 DIAGNOSIS — F329 Major depressive disorder, single episode, unspecified: Secondary | ICD-10-CM | POA: Diagnosis not present

## 2018-06-16 DIAGNOSIS — E8881 Metabolic syndrome: Secondary | ICD-10-CM | POA: Diagnosis not present

## 2018-07-16 DIAGNOSIS — M159 Polyosteoarthritis, unspecified: Secondary | ICD-10-CM | POA: Diagnosis not present

## 2018-07-16 DIAGNOSIS — F419 Anxiety disorder, unspecified: Secondary | ICD-10-CM | POA: Diagnosis not present

## 2018-07-16 DIAGNOSIS — E8881 Metabolic syndrome: Secondary | ICD-10-CM | POA: Diagnosis not present

## 2018-07-16 DIAGNOSIS — F329 Major depressive disorder, single episode, unspecified: Secondary | ICD-10-CM | POA: Diagnosis not present

## 2018-08-16 DIAGNOSIS — F419 Anxiety disorder, unspecified: Secondary | ICD-10-CM | POA: Diagnosis not present

## 2018-08-16 DIAGNOSIS — E8881 Metabolic syndrome: Secondary | ICD-10-CM | POA: Diagnosis not present

## 2018-08-16 DIAGNOSIS — Z23 Encounter for immunization: Secondary | ICD-10-CM | POA: Diagnosis not present

## 2018-08-16 DIAGNOSIS — F329 Major depressive disorder, single episode, unspecified: Secondary | ICD-10-CM | POA: Diagnosis not present

## 2018-08-16 DIAGNOSIS — M159 Polyosteoarthritis, unspecified: Secondary | ICD-10-CM | POA: Diagnosis not present

## 2018-08-25 DIAGNOSIS — H401131 Primary open-angle glaucoma, bilateral, mild stage: Secondary | ICD-10-CM | POA: Diagnosis not present

## 2018-09-16 DIAGNOSIS — F3341 Major depressive disorder, recurrent, in partial remission: Secondary | ICD-10-CM | POA: Diagnosis not present

## 2018-09-16 DIAGNOSIS — M159 Polyosteoarthritis, unspecified: Secondary | ICD-10-CM | POA: Diagnosis not present

## 2018-09-16 DIAGNOSIS — G309 Alzheimer's disease, unspecified: Secondary | ICD-10-CM | POA: Diagnosis not present

## 2018-09-16 DIAGNOSIS — E7800 Pure hypercholesterolemia, unspecified: Secondary | ICD-10-CM | POA: Diagnosis not present

## 2018-09-16 DIAGNOSIS — D649 Anemia, unspecified: Secondary | ICD-10-CM | POA: Diagnosis not present

## 2018-09-16 DIAGNOSIS — F419 Anxiety disorder, unspecified: Secondary | ICD-10-CM | POA: Diagnosis not present

## 2018-09-16 DIAGNOSIS — I10 Essential (primary) hypertension: Secondary | ICD-10-CM | POA: Diagnosis not present

## 2018-09-16 DIAGNOSIS — Z1339 Encounter for screening examination for other mental health and behavioral disorders: Secondary | ICD-10-CM | POA: Diagnosis not present

## 2018-10-08 DIAGNOSIS — I8310 Varicose veins of unspecified lower extremity with inflammation: Secondary | ICD-10-CM | POA: Diagnosis not present

## 2018-10-14 DIAGNOSIS — F419 Anxiety disorder, unspecified: Secondary | ICD-10-CM | POA: Diagnosis not present

## 2018-10-14 DIAGNOSIS — M159 Polyosteoarthritis, unspecified: Secondary | ICD-10-CM | POA: Diagnosis not present

## 2018-10-14 DIAGNOSIS — G309 Alzheimer's disease, unspecified: Secondary | ICD-10-CM | POA: Diagnosis not present

## 2018-10-14 DIAGNOSIS — F3341 Major depressive disorder, recurrent, in partial remission: Secondary | ICD-10-CM | POA: Diagnosis not present

## 2018-12-03 DIAGNOSIS — F419 Anxiety disorder, unspecified: Secondary | ICD-10-CM | POA: Diagnosis not present

## 2018-12-03 DIAGNOSIS — F3341 Major depressive disorder, recurrent, in partial remission: Secondary | ICD-10-CM | POA: Diagnosis not present

## 2018-12-03 DIAGNOSIS — M159 Polyosteoarthritis, unspecified: Secondary | ICD-10-CM | POA: Diagnosis not present

## 2018-12-03 DIAGNOSIS — J208 Acute bronchitis due to other specified organisms: Secondary | ICD-10-CM | POA: Diagnosis not present

## 2018-12-13 DIAGNOSIS — F419 Anxiety disorder, unspecified: Secondary | ICD-10-CM | POA: Diagnosis not present

## 2018-12-13 DIAGNOSIS — G309 Alzheimer's disease, unspecified: Secondary | ICD-10-CM | POA: Diagnosis not present

## 2018-12-13 DIAGNOSIS — M159 Polyosteoarthritis, unspecified: Secondary | ICD-10-CM | POA: Diagnosis not present

## 2018-12-13 DIAGNOSIS — F3341 Major depressive disorder, recurrent, in partial remission: Secondary | ICD-10-CM | POA: Diagnosis not present

## 2018-12-29 DIAGNOSIS — H401131 Primary open-angle glaucoma, bilateral, mild stage: Secondary | ICD-10-CM | POA: Diagnosis not present

## 2019-01-10 DIAGNOSIS — F419 Anxiety disorder, unspecified: Secondary | ICD-10-CM | POA: Diagnosis not present

## 2019-01-10 DIAGNOSIS — M159 Polyosteoarthritis, unspecified: Secondary | ICD-10-CM | POA: Diagnosis not present

## 2019-01-10 DIAGNOSIS — R2681 Unsteadiness on feet: Secondary | ICD-10-CM | POA: Diagnosis not present

## 2019-01-10 DIAGNOSIS — F3341 Major depressive disorder, recurrent, in partial remission: Secondary | ICD-10-CM | POA: Diagnosis not present

## 2019-01-16 DIAGNOSIS — R52 Pain, unspecified: Secondary | ICD-10-CM | POA: Diagnosis not present

## 2019-01-16 DIAGNOSIS — W19XXXA Unspecified fall, initial encounter: Secondary | ICD-10-CM | POA: Diagnosis not present

## 2019-01-16 DIAGNOSIS — M79652 Pain in left thigh: Secondary | ICD-10-CM | POA: Diagnosis not present

## 2019-01-16 DIAGNOSIS — R0902 Hypoxemia: Secondary | ICD-10-CM | POA: Diagnosis not present

## 2019-01-16 DIAGNOSIS — M25552 Pain in left hip: Secondary | ICD-10-CM | POA: Diagnosis not present

## 2019-01-16 DIAGNOSIS — S299XXA Unspecified injury of thorax, initial encounter: Secondary | ICD-10-CM | POA: Diagnosis not present

## 2019-01-16 DIAGNOSIS — S32502A Unspecified fracture of left pubis, initial encounter for closed fracture: Secondary | ICD-10-CM | POA: Diagnosis not present

## 2019-01-16 DIAGNOSIS — I10 Essential (primary) hypertension: Secondary | ICD-10-CM | POA: Diagnosis not present

## 2019-01-16 DIAGNOSIS — S32512A Fracture of superior rim of left pubis, initial encounter for closed fracture: Secondary | ICD-10-CM | POA: Diagnosis not present

## 2019-01-16 DIAGNOSIS — S329XXA Fracture of unspecified parts of lumbosacral spine and pelvis, initial encounter for closed fracture: Secondary | ICD-10-CM | POA: Diagnosis not present

## 2019-01-16 DIAGNOSIS — F418 Other specified anxiety disorders: Secondary | ICD-10-CM | POA: Diagnosis not present

## 2019-01-16 DIAGNOSIS — S79922A Unspecified injury of left thigh, initial encounter: Secondary | ICD-10-CM | POA: Diagnosis not present

## 2019-01-16 DIAGNOSIS — F0390 Unspecified dementia without behavioral disturbance: Secondary | ICD-10-CM | POA: Diagnosis not present

## 2019-03-02 DIAGNOSIS — S32412A Displaced fracture of anterior wall of left acetabulum, initial encounter for closed fracture: Secondary | ICD-10-CM | POA: Diagnosis not present

## 2019-03-02 DIAGNOSIS — M79641 Pain in right hand: Secondary | ICD-10-CM | POA: Diagnosis not present

## 2019-03-02 DIAGNOSIS — S32501A Unspecified fracture of right pubis, initial encounter for closed fracture: Secondary | ICD-10-CM | POA: Diagnosis not present

## 2019-03-02 DIAGNOSIS — S32519A Fracture of superior rim of unspecified pubis, initial encounter for closed fracture: Secondary | ICD-10-CM | POA: Diagnosis not present

## 2019-03-02 DIAGNOSIS — S32592A Other specified fracture of left pubis, initial encounter for closed fracture: Secondary | ICD-10-CM | POA: Diagnosis not present

## 2019-03-02 DIAGNOSIS — S32512A Fracture of superior rim of left pubis, initial encounter for closed fracture: Secondary | ICD-10-CM | POA: Diagnosis not present

## 2019-03-02 DIAGNOSIS — S32502A Unspecified fracture of left pubis, initial encounter for closed fracture: Secondary | ICD-10-CM | POA: Diagnosis not present

## 2019-03-02 DIAGNOSIS — M25531 Pain in right wrist: Secondary | ICD-10-CM | POA: Diagnosis not present

## 2019-03-02 DIAGNOSIS — S52501A Unspecified fracture of the lower end of right radius, initial encounter for closed fracture: Secondary | ICD-10-CM | POA: Diagnosis not present

## 2019-04-27 DIAGNOSIS — F419 Anxiety disorder, unspecified: Secondary | ICD-10-CM | POA: Diagnosis not present

## 2019-04-27 DIAGNOSIS — G309 Alzheimer's disease, unspecified: Secondary | ICD-10-CM | POA: Diagnosis not present

## 2019-04-27 DIAGNOSIS — M159 Polyosteoarthritis, unspecified: Secondary | ICD-10-CM | POA: Diagnosis not present

## 2019-04-27 DIAGNOSIS — F3341 Major depressive disorder, recurrent, in partial remission: Secondary | ICD-10-CM | POA: Diagnosis not present

## 2019-05-25 DIAGNOSIS — G309 Alzheimer's disease, unspecified: Secondary | ICD-10-CM | POA: Diagnosis not present

## 2019-05-25 DIAGNOSIS — F3341 Major depressive disorder, recurrent, in partial remission: Secondary | ICD-10-CM | POA: Diagnosis not present

## 2019-05-25 DIAGNOSIS — M159 Polyosteoarthritis, unspecified: Secondary | ICD-10-CM | POA: Diagnosis not present

## 2019-05-25 DIAGNOSIS — F419 Anxiety disorder, unspecified: Secondary | ICD-10-CM | POA: Diagnosis not present

## 2019-05-25 DIAGNOSIS — Z9181 History of falling: Secondary | ICD-10-CM | POA: Diagnosis not present

## 2019-06-08 DIAGNOSIS — F3341 Major depressive disorder, recurrent, in partial remission: Secondary | ICD-10-CM | POA: Diagnosis not present

## 2019-06-08 DIAGNOSIS — F419 Anxiety disorder, unspecified: Secondary | ICD-10-CM | POA: Diagnosis not present

## 2019-06-08 DIAGNOSIS — G309 Alzheimer's disease, unspecified: Secondary | ICD-10-CM | POA: Diagnosis not present

## 2019-06-08 DIAGNOSIS — M159 Polyosteoarthritis, unspecified: Secondary | ICD-10-CM | POA: Diagnosis not present

## 2019-06-22 DIAGNOSIS — F0280 Dementia in other diseases classified elsewhere without behavioral disturbance: Secondary | ICD-10-CM | POA: Diagnosis not present

## 2019-06-22 DIAGNOSIS — F3341 Major depressive disorder, recurrent, in partial remission: Secondary | ICD-10-CM | POA: Diagnosis not present

## 2019-06-22 DIAGNOSIS — G309 Alzheimer's disease, unspecified: Secondary | ICD-10-CM | POA: Diagnosis not present

## 2019-06-22 DIAGNOSIS — M159 Polyosteoarthritis, unspecified: Secondary | ICD-10-CM | POA: Diagnosis not present

## 2019-07-06 DIAGNOSIS — F419 Anxiety disorder, unspecified: Secondary | ICD-10-CM | POA: Diagnosis not present

## 2019-07-06 DIAGNOSIS — F3341 Major depressive disorder, recurrent, in partial remission: Secondary | ICD-10-CM | POA: Diagnosis not present

## 2019-07-06 DIAGNOSIS — M159 Polyosteoarthritis, unspecified: Secondary | ICD-10-CM | POA: Diagnosis not present

## 2019-07-06 DIAGNOSIS — G309 Alzheimer's disease, unspecified: Secondary | ICD-10-CM | POA: Diagnosis not present

## 2019-07-20 DIAGNOSIS — F419 Anxiety disorder, unspecified: Secondary | ICD-10-CM | POA: Diagnosis not present

## 2019-07-20 DIAGNOSIS — G309 Alzheimer's disease, unspecified: Secondary | ICD-10-CM | POA: Diagnosis not present

## 2019-07-20 DIAGNOSIS — F3341 Major depressive disorder, recurrent, in partial remission: Secondary | ICD-10-CM | POA: Diagnosis not present

## 2019-07-20 DIAGNOSIS — M159 Polyosteoarthritis, unspecified: Secondary | ICD-10-CM | POA: Diagnosis not present

## 2019-08-03 DIAGNOSIS — F3341 Major depressive disorder, recurrent, in partial remission: Secondary | ICD-10-CM | POA: Diagnosis not present

## 2019-08-03 DIAGNOSIS — G309 Alzheimer's disease, unspecified: Secondary | ICD-10-CM | POA: Diagnosis not present

## 2019-08-03 DIAGNOSIS — F0280 Dementia in other diseases classified elsewhere without behavioral disturbance: Secondary | ICD-10-CM | POA: Diagnosis not present

## 2019-08-03 DIAGNOSIS — M159 Polyosteoarthritis, unspecified: Secondary | ICD-10-CM | POA: Diagnosis not present

## 2019-09-01 DIAGNOSIS — E7800 Pure hypercholesterolemia, unspecified: Secondary | ICD-10-CM | POA: Diagnosis not present

## 2019-09-01 DIAGNOSIS — I10 Essential (primary) hypertension: Secondary | ICD-10-CM | POA: Diagnosis not present

## 2019-09-01 DIAGNOSIS — D649 Anemia, unspecified: Secondary | ICD-10-CM | POA: Diagnosis not present

## 2019-09-01 DIAGNOSIS — G309 Alzheimer's disease, unspecified: Secondary | ICD-10-CM | POA: Diagnosis not present

## 2019-09-01 DIAGNOSIS — M159 Polyosteoarthritis, unspecified: Secondary | ICD-10-CM | POA: Diagnosis not present

## 2019-09-01 DIAGNOSIS — M8580 Other specified disorders of bone density and structure, unspecified site: Secondary | ICD-10-CM | POA: Diagnosis not present

## 2019-09-01 DIAGNOSIS — F0280 Dementia in other diseases classified elsewhere without behavioral disturbance: Secondary | ICD-10-CM | POA: Diagnosis not present

## 2019-09-01 DIAGNOSIS — Z23 Encounter for immunization: Secondary | ICD-10-CM | POA: Diagnosis not present

## 2019-09-15 DIAGNOSIS — M159 Polyosteoarthritis, unspecified: Secondary | ICD-10-CM | POA: Diagnosis not present

## 2019-09-15 DIAGNOSIS — F3341 Major depressive disorder, recurrent, in partial remission: Secondary | ICD-10-CM | POA: Diagnosis not present

## 2019-09-15 DIAGNOSIS — F419 Anxiety disorder, unspecified: Secondary | ICD-10-CM | POA: Diagnosis not present

## 2019-09-15 DIAGNOSIS — G309 Alzheimer's disease, unspecified: Secondary | ICD-10-CM | POA: Diagnosis not present

## 2019-12-30 DIAGNOSIS — E785 Hyperlipidemia, unspecified: Secondary | ICD-10-CM | POA: Diagnosis not present

## 2019-12-30 DIAGNOSIS — Z0000 Encounter for general adult medical examination without abnormal findings: Secondary | ICD-10-CM | POA: Diagnosis not present

## 2019-12-30 DIAGNOSIS — G309 Alzheimer's disease, unspecified: Secondary | ICD-10-CM | POA: Diagnosis not present

## 2019-12-30 DIAGNOSIS — M159 Polyosteoarthritis, unspecified: Secondary | ICD-10-CM | POA: Diagnosis not present

## 2019-12-30 DIAGNOSIS — F3341 Major depressive disorder, recurrent, in partial remission: Secondary | ICD-10-CM | POA: Diagnosis not present

## 2019-12-30 DIAGNOSIS — F419 Anxiety disorder, unspecified: Secondary | ICD-10-CM | POA: Diagnosis not present

## 2019-12-30 DIAGNOSIS — Z9181 History of falling: Secondary | ICD-10-CM | POA: Diagnosis not present

## 2020-01-13 DIAGNOSIS — N3281 Overactive bladder: Secondary | ICD-10-CM | POA: Diagnosis not present

## 2020-01-13 DIAGNOSIS — F419 Anxiety disorder, unspecified: Secondary | ICD-10-CM | POA: Diagnosis not present

## 2020-01-13 DIAGNOSIS — F3341 Major depressive disorder, recurrent, in partial remission: Secondary | ICD-10-CM | POA: Diagnosis not present

## 2020-01-13 DIAGNOSIS — M159 Polyosteoarthritis, unspecified: Secondary | ICD-10-CM | POA: Diagnosis not present

## 2020-01-27 DIAGNOSIS — M159 Polyosteoarthritis, unspecified: Secondary | ICD-10-CM | POA: Diagnosis not present

## 2020-01-27 DIAGNOSIS — N3281 Overactive bladder: Secondary | ICD-10-CM | POA: Diagnosis not present

## 2020-01-27 DIAGNOSIS — F419 Anxiety disorder, unspecified: Secondary | ICD-10-CM | POA: Diagnosis not present

## 2020-01-27 DIAGNOSIS — F3341 Major depressive disorder, recurrent, in partial remission: Secondary | ICD-10-CM | POA: Diagnosis not present

## 2020-02-10 DIAGNOSIS — F3341 Major depressive disorder, recurrent, in partial remission: Secondary | ICD-10-CM | POA: Diagnosis not present

## 2020-02-10 DIAGNOSIS — N3281 Overactive bladder: Secondary | ICD-10-CM | POA: Diagnosis not present

## 2020-02-10 DIAGNOSIS — M159 Polyosteoarthritis, unspecified: Secondary | ICD-10-CM | POA: Diagnosis not present

## 2020-02-10 DIAGNOSIS — F419 Anxiety disorder, unspecified: Secondary | ICD-10-CM | POA: Diagnosis not present

## 2020-03-02 DIAGNOSIS — N3281 Overactive bladder: Secondary | ICD-10-CM | POA: Diagnosis not present

## 2020-03-02 DIAGNOSIS — F419 Anxiety disorder, unspecified: Secondary | ICD-10-CM | POA: Diagnosis not present

## 2020-03-02 DIAGNOSIS — F3341 Major depressive disorder, recurrent, in partial remission: Secondary | ICD-10-CM | POA: Diagnosis not present

## 2020-03-02 DIAGNOSIS — R110 Nausea: Secondary | ICD-10-CM | POA: Diagnosis not present

## 2020-04-30 DIAGNOSIS — F3341 Major depressive disorder, recurrent, in partial remission: Secondary | ICD-10-CM | POA: Diagnosis not present

## 2020-04-30 DIAGNOSIS — N3281 Overactive bladder: Secondary | ICD-10-CM | POA: Diagnosis not present

## 2020-04-30 DIAGNOSIS — Z6829 Body mass index (BMI) 29.0-29.9, adult: Secondary | ICD-10-CM | POA: Diagnosis not present

## 2020-04-30 DIAGNOSIS — F419 Anxiety disorder, unspecified: Secondary | ICD-10-CM | POA: Diagnosis not present

## 2020-04-30 DIAGNOSIS — G309 Alzheimer's disease, unspecified: Secondary | ICD-10-CM | POA: Diagnosis not present

## 2020-08-07 DIAGNOSIS — L97921 Non-pressure chronic ulcer of unspecified part of left lower leg limited to breakdown of skin: Secondary | ICD-10-CM | POA: Diagnosis not present

## 2020-08-07 DIAGNOSIS — I8310 Varicose veins of unspecified lower extremity with inflammation: Secondary | ICD-10-CM | POA: Diagnosis not present

## 2020-08-07 DIAGNOSIS — L0390 Cellulitis, unspecified: Secondary | ICD-10-CM | POA: Diagnosis not present

## 2021-02-06 DIAGNOSIS — E785 Hyperlipidemia, unspecified: Secondary | ICD-10-CM | POA: Diagnosis not present

## 2021-02-06 DIAGNOSIS — Z1331 Encounter for screening for depression: Secondary | ICD-10-CM | POA: Diagnosis not present

## 2021-02-06 DIAGNOSIS — Z0000 Encounter for general adult medical examination without abnormal findings: Secondary | ICD-10-CM | POA: Diagnosis not present

## 2021-02-06 DIAGNOSIS — Z9181 History of falling: Secondary | ICD-10-CM | POA: Diagnosis not present

## 2021-06-01 DIAGNOSIS — Z20822 Contact with and (suspected) exposure to covid-19: Secondary | ICD-10-CM | POA: Diagnosis not present

## 2021-09-02 DIAGNOSIS — Z20822 Contact with and (suspected) exposure to covid-19: Secondary | ICD-10-CM | POA: Diagnosis not present

## 2021-10-05 DIAGNOSIS — Z20828 Contact with and (suspected) exposure to other viral communicable diseases: Secondary | ICD-10-CM | POA: Diagnosis not present

## 2021-11-08 DIAGNOSIS — Z20822 Contact with and (suspected) exposure to covid-19: Secondary | ICD-10-CM | POA: Diagnosis not present

## 2021-12-27 DIAGNOSIS — Z20822 Contact with and (suspected) exposure to covid-19: Secondary | ICD-10-CM | POA: Diagnosis not present

## 2022-01-22 DIAGNOSIS — Z20822 Contact with and (suspected) exposure to covid-19: Secondary | ICD-10-CM | POA: Diagnosis not present

## 2022-02-11 DIAGNOSIS — Z9181 History of falling: Secondary | ICD-10-CM | POA: Diagnosis not present

## 2022-02-11 DIAGNOSIS — Z0000 Encounter for general adult medical examination without abnormal findings: Secondary | ICD-10-CM | POA: Diagnosis not present

## 2022-02-11 DIAGNOSIS — E785 Hyperlipidemia, unspecified: Secondary | ICD-10-CM | POA: Diagnosis not present

## 2022-02-11 DIAGNOSIS — Z139 Encounter for screening, unspecified: Secondary | ICD-10-CM | POA: Diagnosis not present

## 2022-02-11 DIAGNOSIS — Z1331 Encounter for screening for depression: Secondary | ICD-10-CM | POA: Diagnosis not present

## 2022-02-14 DIAGNOSIS — Z20822 Contact with and (suspected) exposure to covid-19: Secondary | ICD-10-CM | POA: Diagnosis not present

## 2022-03-11 DIAGNOSIS — Z20822 Contact with and (suspected) exposure to covid-19: Secondary | ICD-10-CM | POA: Diagnosis not present

## 2022-04-20 DIAGNOSIS — I700 Atherosclerosis of aorta: Secondary | ICD-10-CM | POA: Diagnosis not present

## 2022-04-20 DIAGNOSIS — N3000 Acute cystitis without hematuria: Secondary | ICD-10-CM | POA: Diagnosis not present

## 2022-04-20 DIAGNOSIS — R4182 Altered mental status, unspecified: Secondary | ICD-10-CM | POA: Diagnosis not present

## 2022-04-20 DIAGNOSIS — R609 Edema, unspecified: Secondary | ICD-10-CM | POA: Diagnosis not present

## 2022-04-20 DIAGNOSIS — R7989 Other specified abnormal findings of blood chemistry: Secondary | ICD-10-CM | POA: Diagnosis not present

## 2022-04-20 DIAGNOSIS — C221 Intrahepatic bile duct carcinoma: Secondary | ICD-10-CM | POA: Diagnosis not present

## 2022-04-20 DIAGNOSIS — F0390 Unspecified dementia without behavioral disturbance: Secondary | ICD-10-CM | POA: Diagnosis not present

## 2022-04-20 DIAGNOSIS — K5730 Diverticulosis of large intestine without perforation or abscess without bleeding: Secondary | ICD-10-CM | POA: Diagnosis not present

## 2022-04-20 DIAGNOSIS — R410 Disorientation, unspecified: Secondary | ICD-10-CM | POA: Diagnosis not present

## 2022-04-20 DIAGNOSIS — F05 Delirium due to known physiological condition: Secondary | ICD-10-CM | POA: Diagnosis not present

## 2022-04-20 DIAGNOSIS — D378 Neoplasm of uncertain behavior of other specified digestive organs: Secondary | ICD-10-CM | POA: Diagnosis not present

## 2022-04-20 DIAGNOSIS — F03918 Unspecified dementia, unspecified severity, with other behavioral disturbance: Secondary | ICD-10-CM | POA: Diagnosis not present

## 2022-04-20 DIAGNOSIS — K831 Obstruction of bile duct: Secondary | ICD-10-CM | POA: Diagnosis not present

## 2022-04-21 ENCOUNTER — Inpatient Hospital Stay (HOSPITAL_COMMUNITY)
Admission: AD | Admit: 2022-04-21 | Discharge: 2022-04-29 | DRG: 438 | Disposition: A | Discharge status: Skilled Nursing Facility | Payer: Medicare Other | Source: Other Acute Inpatient Hospital | Attending: Internal Medicine | Admitting: Internal Medicine

## 2022-04-21 ENCOUNTER — Encounter (HOSPITAL_COMMUNITY): Payer: Self-pay | Admitting: Internal Medicine

## 2022-04-21 ENCOUNTER — Observation Stay (HOSPITAL_COMMUNITY): Payer: Medicare Other

## 2022-04-21 DIAGNOSIS — E8809 Other disorders of plasma-protein metabolism, not elsewhere classified: Secondary | ICD-10-CM | POA: Diagnosis not present

## 2022-04-21 DIAGNOSIS — Z9889 Other specified postprocedural states: Secondary | ICD-10-CM | POA: Diagnosis not present

## 2022-04-21 DIAGNOSIS — Z66 Do not resuscitate: Secondary | ICD-10-CM | POA: Diagnosis not present

## 2022-04-21 DIAGNOSIS — C253 Malignant neoplasm of pancreatic duct: Secondary | ICD-10-CM | POA: Diagnosis not present

## 2022-04-21 DIAGNOSIS — E876 Hypokalemia: Secondary | ICD-10-CM | POA: Diagnosis not present

## 2022-04-21 DIAGNOSIS — G9341 Metabolic encephalopathy: Secondary | ICD-10-CM | POA: Diagnosis not present

## 2022-04-21 DIAGNOSIS — Z885 Allergy status to narcotic agent status: Secondary | ICD-10-CM

## 2022-04-21 DIAGNOSIS — F05 Delirium due to known physiological condition: Secondary | ICD-10-CM | POA: Diagnosis not present

## 2022-04-21 DIAGNOSIS — D509 Iron deficiency anemia, unspecified: Secondary | ICD-10-CM | POA: Diagnosis not present

## 2022-04-21 DIAGNOSIS — L89890 Pressure ulcer of other site, unstageable: Secondary | ICD-10-CM | POA: Diagnosis not present

## 2022-04-21 DIAGNOSIS — K869 Disease of pancreas, unspecified: Secondary | ICD-10-CM | POA: Diagnosis not present

## 2022-04-21 DIAGNOSIS — R001 Bradycardia, unspecified: Secondary | ICD-10-CM | POA: Diagnosis not present

## 2022-04-21 DIAGNOSIS — Z981 Arthrodesis status: Secondary | ICD-10-CM

## 2022-04-21 DIAGNOSIS — Z7189 Other specified counseling: Secondary | ICD-10-CM | POA: Diagnosis not present

## 2022-04-21 DIAGNOSIS — R935 Abnormal findings on diagnostic imaging of other abdominal regions, including retroperitoneum: Secondary | ICD-10-CM | POA: Diagnosis not present

## 2022-04-21 DIAGNOSIS — F0390 Unspecified dementia without behavioral disturbance: Secondary | ICD-10-CM | POA: Diagnosis not present

## 2022-04-21 DIAGNOSIS — K219 Gastro-esophageal reflux disease without esophagitis: Secondary | ICD-10-CM | POA: Diagnosis present

## 2022-04-21 DIAGNOSIS — K8689 Other specified diseases of pancreas: Secondary | ICD-10-CM | POA: Diagnosis present

## 2022-04-21 DIAGNOSIS — Z90710 Acquired absence of both cervix and uterus: Secondary | ICD-10-CM | POA: Diagnosis not present

## 2022-04-21 DIAGNOSIS — R17 Unspecified jaundice: Secondary | ICD-10-CM | POA: Diagnosis not present

## 2022-04-21 DIAGNOSIS — R278 Other lack of coordination: Secondary | ICD-10-CM | POA: Diagnosis not present

## 2022-04-21 DIAGNOSIS — Z79899 Other long term (current) drug therapy: Secondary | ICD-10-CM

## 2022-04-21 DIAGNOSIS — R932 Abnormal findings on diagnostic imaging of liver and biliary tract: Secondary | ICD-10-CM | POA: Diagnosis not present

## 2022-04-21 DIAGNOSIS — K831 Obstruction of bile duct: Secondary | ICD-10-CM | POA: Diagnosis present

## 2022-04-21 DIAGNOSIS — M6259 Muscle wasting and atrophy, not elsewhere classified, multiple sites: Secondary | ICD-10-CM | POA: Diagnosis not present

## 2022-04-21 DIAGNOSIS — H9190 Unspecified hearing loss, unspecified ear: Secondary | ICD-10-CM | POA: Diagnosis present

## 2022-04-21 DIAGNOSIS — K2950 Unspecified chronic gastritis without bleeding: Secondary | ICD-10-CM | POA: Diagnosis present

## 2022-04-21 DIAGNOSIS — Z8249 Family history of ischemic heart disease and other diseases of the circulatory system: Secondary | ICD-10-CM

## 2022-04-21 DIAGNOSIS — E7800 Pure hypercholesterolemia, unspecified: Secondary | ICD-10-CM | POA: Diagnosis present

## 2022-04-21 DIAGNOSIS — H259 Unspecified age-related cataract: Secondary | ICD-10-CM | POA: Diagnosis not present

## 2022-04-21 DIAGNOSIS — D696 Thrombocytopenia, unspecified: Secondary | ICD-10-CM | POA: Diagnosis not present

## 2022-04-21 DIAGNOSIS — R262 Difficulty in walking, not elsewhere classified: Secondary | ICD-10-CM | POA: Diagnosis not present

## 2022-04-21 DIAGNOSIS — D378 Neoplasm of uncertain behavior of other specified digestive organs: Secondary | ICD-10-CM | POA: Diagnosis not present

## 2022-04-21 DIAGNOSIS — D630 Anemia in neoplastic disease: Secondary | ICD-10-CM | POA: Diagnosis not present

## 2022-04-21 DIAGNOSIS — Z96653 Presence of artificial knee joint, bilateral: Secondary | ICD-10-CM | POA: Diagnosis present

## 2022-04-21 DIAGNOSIS — R7989 Other specified abnormal findings of blood chemistry: Secondary | ICD-10-CM | POA: Diagnosis not present

## 2022-04-21 DIAGNOSIS — Z9049 Acquired absence of other specified parts of digestive tract: Secondary | ICD-10-CM

## 2022-04-21 DIAGNOSIS — R54 Age-related physical debility: Secondary | ICD-10-CM | POA: Diagnosis present

## 2022-04-21 DIAGNOSIS — D72829 Elevated white blood cell count, unspecified: Secondary | ICD-10-CM | POA: Diagnosis not present

## 2022-04-21 DIAGNOSIS — K838 Other specified diseases of biliary tract: Secondary | ICD-10-CM | POA: Diagnosis not present

## 2022-04-21 DIAGNOSIS — G9340 Encephalopathy, unspecified: Secondary | ICD-10-CM | POA: Diagnosis present

## 2022-04-21 DIAGNOSIS — R41841 Cognitive communication deficit: Secondary | ICD-10-CM | POA: Diagnosis not present

## 2022-04-21 DIAGNOSIS — R109 Unspecified abdominal pain: Secondary | ICD-10-CM | POA: Diagnosis not present

## 2022-04-21 DIAGNOSIS — E872 Acidosis, unspecified: Secondary | ICD-10-CM | POA: Diagnosis not present

## 2022-04-21 DIAGNOSIS — R748 Abnormal levels of other serum enzymes: Secondary | ICD-10-CM | POA: Diagnosis not present

## 2022-04-21 DIAGNOSIS — H409 Unspecified glaucoma: Secondary | ICD-10-CM | POA: Diagnosis not present

## 2022-04-21 DIAGNOSIS — R933 Abnormal findings on diagnostic imaging of other parts of digestive tract: Secondary | ICD-10-CM | POA: Diagnosis not present

## 2022-04-21 DIAGNOSIS — K2970 Gastritis, unspecified, without bleeding: Secondary | ICD-10-CM | POA: Diagnosis not present

## 2022-04-21 DIAGNOSIS — Z96612 Presence of left artificial shoulder joint: Secondary | ICD-10-CM | POA: Diagnosis present

## 2022-04-21 DIAGNOSIS — Z9682 Presence of neurostimulator: Secondary | ICD-10-CM

## 2022-04-21 DIAGNOSIS — D490 Neoplasm of unspecified behavior of digestive system: Secondary | ICD-10-CM | POA: Diagnosis not present

## 2022-04-21 DIAGNOSIS — I10 Essential (primary) hypertension: Secondary | ICD-10-CM | POA: Diagnosis not present

## 2022-04-21 DIAGNOSIS — D649 Anemia, unspecified: Secondary | ICD-10-CM | POA: Diagnosis present

## 2022-04-21 DIAGNOSIS — N3000 Acute cystitis without hematuria: Secondary | ICD-10-CM | POA: Diagnosis not present

## 2022-04-21 DIAGNOSIS — Z515 Encounter for palliative care: Secondary | ICD-10-CM

## 2022-04-21 DIAGNOSIS — L89899 Pressure ulcer of other site, unspecified stage: Secondary | ICD-10-CM | POA: Diagnosis present

## 2022-04-21 DIAGNOSIS — Z781 Physical restraint status: Secondary | ICD-10-CM

## 2022-04-21 DIAGNOSIS — R1312 Dysphagia, oropharyngeal phase: Secondary | ICD-10-CM | POA: Diagnosis not present

## 2022-04-21 DIAGNOSIS — E162 Hypoglycemia, unspecified: Secondary | ICD-10-CM | POA: Diagnosis not present

## 2022-04-21 DIAGNOSIS — M2550 Pain in unspecified joint: Secondary | ICD-10-CM | POA: Diagnosis not present

## 2022-04-21 DIAGNOSIS — C221 Intrahepatic bile duct carcinoma: Secondary | ICD-10-CM | POA: Diagnosis not present

## 2022-04-21 DIAGNOSIS — R456 Violent behavior: Secondary | ICD-10-CM | POA: Diagnosis not present

## 2022-04-21 DIAGNOSIS — F03918 Unspecified dementia, unspecified severity, with other behavioral disturbance: Secondary | ICD-10-CM | POA: Diagnosis not present

## 2022-04-21 DIAGNOSIS — H269 Unspecified cataract: Secondary | ICD-10-CM | POA: Diagnosis present

## 2022-04-21 DIAGNOSIS — E785 Hyperlipidemia, unspecified: Secondary | ICD-10-CM | POA: Diagnosis present

## 2022-04-21 DIAGNOSIS — L853 Xerosis cutis: Secondary | ICD-10-CM | POA: Diagnosis present

## 2022-04-21 DIAGNOSIS — Z7401 Bed confinement status: Secondary | ICD-10-CM | POA: Diagnosis not present

## 2022-04-21 DIAGNOSIS — Z0389 Encounter for observation for other suspected diseases and conditions ruled out: Secondary | ICD-10-CM | POA: Diagnosis not present

## 2022-04-21 HISTORY — DX: Unspecified dementia, unspecified severity, without behavioral disturbance, psychotic disturbance, mood disturbance, and anxiety: F03.90

## 2022-04-21 LAB — CBC WITH DIFFERENTIAL/PLATELET
Abs Immature Granulocytes: 0.04 10*3/uL (ref 0.00–0.07)
Basophils Absolute: 0 10*3/uL (ref 0.0–0.1)
Basophils Relative: 0 %
Eosinophils Absolute: 0 10*3/uL (ref 0.0–0.5)
Eosinophils Relative: 0 %
HCT: 38.3 % (ref 36.0–46.0)
Hemoglobin: 12.7 g/dL (ref 12.0–15.0)
Immature Granulocytes: 1 %
Lymphocytes Relative: 20 %
Lymphs Abs: 1.6 10*3/uL (ref 0.7–4.0)
MCH: 34 pg (ref 26.0–34.0)
MCHC: 33.2 g/dL (ref 30.0–36.0)
MCV: 102.4 fL — ABNORMAL HIGH (ref 80.0–100.0)
Monocytes Absolute: 0.6 10*3/uL (ref 0.1–1.0)
Monocytes Relative: 7 %
Neutro Abs: 5.7 10*3/uL (ref 1.7–7.7)
Neutrophils Relative %: 72 %
Platelets: 171 10*3/uL (ref 150–400)
RBC: 3.74 MIL/uL — ABNORMAL LOW (ref 3.87–5.11)
RDW: 14.3 % (ref 11.5–15.5)
WBC: 7.9 10*3/uL (ref 4.0–10.5)
nRBC: 0 % (ref 0.0–0.2)

## 2022-04-21 LAB — COMPREHENSIVE METABOLIC PANEL
ALT: 201 U/L — ABNORMAL HIGH (ref 0–44)
AST: 318 U/L — ABNORMAL HIGH (ref 15–41)
Albumin: 2.6 g/dL — ABNORMAL LOW (ref 3.5–5.0)
Alkaline Phosphatase: 599 U/L — ABNORMAL HIGH (ref 38–126)
Anion gap: 9 (ref 5–15)
BUN: 12 mg/dL (ref 8–23)
CO2: 22 mmol/L (ref 22–32)
Calcium: 8.4 mg/dL — ABNORMAL LOW (ref 8.9–10.3)
Chloride: 108 mmol/L (ref 98–111)
Creatinine, Ser: 0.67 mg/dL (ref 0.44–1.00)
GFR, Estimated: >60 mL/min (ref >60)
Glucose, Bld: 121 mg/dL — ABNORMAL HIGH (ref 70–99)
Potassium: 3.4 mmol/L — ABNORMAL LOW (ref 3.5–5.1)
Sodium: 139 mmol/L (ref 135–145)
Total Bilirubin: 2.6 mg/dL — ABNORMAL HIGH (ref 0.3–1.2)
Total Protein: 5.8 g/dL — ABNORMAL LOW (ref 6.5–8.1)

## 2022-04-21 LAB — TSH: TSH: 3.144 u[IU]/mL (ref 0.350–4.500)

## 2022-04-21 LAB — AMMONIA: Ammonia: 38 umol/L — ABNORMAL HIGH (ref 9–35)

## 2022-04-21 MED ORDER — LACTATED RINGERS IV SOLN: INTRAVENOUS | Status: DC

## 2022-04-21 MED ORDER — HYDROCERIN EX CREA
TOPICAL_CREAM | Freq: Every day | CUTANEOUS | Status: DC
  Filled 2022-04-21: qty 113

## 2022-04-21 MED ORDER — ONDANSETRON HCL 4 MG PO TABS: 4.0000 mg | ORAL_TABLET | Freq: Four times a day (QID) | ORAL | Status: DC | PRN

## 2022-04-21 MED ORDER — ONDANSETRON HCL 4 MG/2ML IJ SOLN: 4.0000 mg | Freq: Four times a day (QID) | INTRAMUSCULAR | Status: DC | PRN

## 2022-04-21 MED ORDER — ENOXAPARIN SODIUM 40 MG/0.4ML IJ SOSY
40.0000 mg | PREFILLED_SYRINGE | INTRAMUSCULAR | Status: DC
  Administered 2022-04-21 – 2022-04-28 (×8): 40 mg via SUBCUTANEOUS
  Filled 2022-04-21 (×8): qty 0.4

## 2022-04-21 NOTE — Consult Note (Signed)
Referring Provider:  [facility] Primary Care Physician:  [name], MD Primary Gastroenterologist: Althia Forts  Reason for Consultation: Abnormal LFTs, abnormal CT concerning for pancreatic head mass  HPI: [name] is a 81 y.o. female with past medical history of dementia transferred from [facility] for further evaluation and work-up for abnormal CT scan concerning for pancreatic mass.  Patient was taken to [facility] by EMS for worsening dementia.  Initial work-up revealed abnormal LFTs.  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  Status postcholecystectomy.  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  Patient seen and examined at bedside.  Not able to get any history from patient.  Called and discussed with patient's husband.  Patient with worsening confusion over last several days with baseline dementia.  Husband not able to provide detailed history but recalls having some back surgery many years ago.   Past Medical History:  Diagnosis Date   Arthritis 08/25/2012   osteoarthritis-all joints-left shoulder pain.   Cataract 08/25/2012   bilateral-not surgical yet   Complication of anesthesia 08/25/2012   prolong sedation with anesthesia   Dementia (HCC)    GERD (gastroesophageal reflux disease) 08/25/2012   tx. Prilosec   Hypercholesterolemia 08/25/2012   hx. tx. meds   Hypertension    Interstitial cystitis 08/25/2012   Has implanted Interstim Left Flank-is functional with remote    Past Surgical History:  Procedure Laterality Date   ABDOMINAL HYSTERECTOMY  08-25-12   vaginal Hysterectomy   BACK SURGERY  08-25-12   Lumbar fusion-retained hardware/cervical  fusion   [place]  08-25-12   open   INTERSTIM IMPLANT PLACEMENT  08-25-12   hx. -remains functional left flank   JOINT REPLACEMENT  08-25-12   Bil. TKA/ LT shoulder  replacement   REVERSE SHOULDER ARTHROPLASTY  09/09/2012   Procedure: REVERSE SHOULDER ARTHROPLASTY;  Surgeon: Nita Sells, MD;  Location: [facility] ORS;  Service: Orthopedics;  Laterality: Left;  with left supraclavicular block    Prior to Admission medications   Medication Sig Start Date End Date Taking? Authorizing Provider  acetaminophen (TYLENOL) 500 MG tablet Take 500 mg by mouth every 6 (six) hours as needed for mild pain.   Yes [provider]  bismuth subsalicylate (PEPTO BISMOL) 262 MG/15ML suspension Take 30 mLs by mouth every 6 (six) hours as needed for indigestion.   Yes [provider]  atenolol (TENORMIN) 100 MG tablet Take 100 mg by mouth daily before breakfast.  Patient not taking: Reported on 04/21/2022    [provider]  latanoprost (XALATAN) 0.005 % ophthalmic solution Place 1 drop into both eyes at bedtime. Patient not taking: Reported on 04/21/2022    [provider]  PARoxetine (PAXIL) 40 MG tablet Take 40 mg by mouth daily before breakfast.  Patient not taking: Reported on 04/21/2022    [provider]  pravastatin (PRAVACHOL) 40 MG tablet Take 40 mg by mouth daily before breakfast.  Patient not taking: Reported on 04/21/2022    [provider]  rOPINIRole (REQUIP) 4 MG tablet Take 4 mg by mouth at bedtime.   Patient not taking: Reported on 04/21/2022    [provider]    Scheduled Meds:  enoxaparin (LOVENOX) injection  40 mg Subcutaneous Q24H   hydrocerin   Topical Daily   Continuous Infusions:  lactated ringers 75 mL/hr at 04/21/22 0600  PRN Meds:.ondansetron **OR** ondansetron (ZOFRAN) IV  Allergies as of 04/20/2022 - Review Complete 09/09/2012  Allergen Reaction Noted   Percocet [oxycodone-acetaminophen] Nausea And Vomiting and Other (See Comments) 09/26/2011    Family History  Problem Relation Age of Onset   Cancer Mother    Hypertension Mother    Heart disease Brother    Cancer  Maternal Grandmother    Cancer Daughter    Diabetes Neg Hx    Stroke Neg Hx     Social History   Socioeconomic History   Marital status: Married    Spouse name: Not on file   Number of children: Not on file   Years of education: Not on file   Highest education level: Not on file  Occupational History   Not on file  Tobacco Use   Smoking status: Never   Smokeless tobacco: Not on file  Substance and Sexual Activity   Alcohol use: No   Drug use: No   Sexual activity: Not Currently  Other Topics Concern   Not on file  Social History Narrative   Not on file   Social Determinants of Health   Financial Resource Strain: Not on file  Food Insecurity: Not on file  Transportation Needs: Not on file  Physical Activity: Not on file  Stress: Not on file  Social Connections: Not on file  Intimate Partner Violence: Not on file    Review of Systems: Not able to obtain  Physical Exam: Vital signs: Vitals:   04/21/22 0650 04/21/22 0847  BP: 105/61 117/63  Pulse: (!) 50 89  Resp:  18  SpO2:  (!) 87%   Last BM Date :  (unknown) General:   Confused, not able to give any history Lungs: No visible respiratory distress Heart:  Regular rate and rhythm; no murmurs, clicks, rubs,  or gallops. Abdomen: Soft, nontender, nondistended, bowel sounds present, no peritoneal signs Rectal:  Deferred  GI:  Lab Results: Recent Labs    04/21/22 0529  WBC 7.9  HGB 12.7  HCT 38.3  PLT 171   BMET Recent Labs    04/21/22 0529  NA 139  K 3.4*  CL 108  CO2 22  GLUCOSE 121*  BUN 12  CREATININE 0.67  CALCIUM 8.4*   LFT Recent Labs    04/21/22 0529  PROT 5.8*  ALBUMIN 2.6*  AST 318*  ALT 201*  ALKPHOS 599*  BILITOT 2.6*   PT/INR No results for input(s): LABPROT, INR in the last 72 hours.   Studies/Results: No results found.  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  Secondary to above.    Recommendation ------------------------ -CT scan report from outside hospital reviewed personally. -Called and discussed with patient's husband.   -We will get x-ray thoracic spine and lumbar spine to rule out any implanted device placement.  If x-ray negative, she may benefit from MRI MRCP for further evaluation. -If not able to perform MRI, we will consider EGD for further evaluation. -GI will follow   LOS: 1 day   Otis Brace  MD, FACP 04/21/2022, 9:38 AM  Contact #  [phone]

## 2022-04-21 NOTE — Consult Note (Addendum)
WOC Nurse Consult Note: Reason for Consult: Consult requested for bilat legs and sacrum/buttocks Sacrum/bilat buttocks are red, moist, macerated and painful to the touch with patchy areas of partial thickness skin loss.  Appearance is consistent with moisture associated skin damage; affected area is approx 6X6X.1cm.  Pt is frequently incontinent of urine and it is difficult to keep the affected area from becoming soiled.   ICD-10 CM Codes for Irritant Dermatitis L24A2 - Due to fecal, urinary or dual incontinence  Bilat legs with generalized erythremia and patchy areas of dry brown scabs and loose peeling skin which remove easily.  Scattered areas of red dry partial thickness wounds, no edema or drainage requiring compression wraps. Dressing procedure/placement/frequency: Topical treatment orders provided for bedside nurses to perform as follows to protect and promote healing:  1. Apply Eucerin cream to bilat legs Q day after bathing and roughly towel drying to assist with removal of loose dried skin 2. Foam dressing to sacrum, change Q 3 days or PRN soiling. Please re-consult if further assistance is needed.  Thank-you,  Julien Girt MSN, [place], [place], [place], [place]

## 2022-04-21 NOTE — H&P (Addendum)
History and Physical    Patient: [name] IRS:[id number] DOB: 1941/01/23 DOA: 04/21/2022 DOS: the patient was seen and examined on 04/21/2022 PCP: [name], MD  Patient coming from: Home  Chief Complaint: No chief complaint on file.  HPI: [name] is a 81 y.o. female with medical history significant of HTN.  Pt reportedly lives at home with husband.  Pt has unknown baseline mentation, but reportedly per EMS her husband reports that patient has been more confused recently and had poor PO intake.  Today per husband, pt didn't even know who he was, this prompted him to call EMS.  Husband was also a difficult historian per EMS secondary to him being very hard of hearing.  Pt was brought to [facility] for evaluation.  At [facility], pt not able to provide basically any history secondary to mental status.  Work up in ED revealed transaminates and hyperbilirubinemia.  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  Read recd endoscopy for further evaluation.  Due to no GI availability at [facility], pt was transferred to [facility]  Unfortunately as per the [facility] EDPs notes, pt is totally unable to participate in any meaningful history taking.  I called and spoke with husband: Apparently pt has "a little bit" of dementia at baseline, but otherwise is very functional prior to onset of symptoms ~1 week ago.  Symptoms progressively worsening since onset.  Husband will physically be here at [facility] later today he says.  Review of Systems: As mentioned in the history of present illness. All other systems reviewed and are negative. Past Medical History:  Diagnosis Date   Arthritis 08/25/2012   osteoarthritis-all joints-left shoulder pain.   Cataract 08/25/2012   bilateral-not surgical yet   Complication of anesthesia 08/25/2012   prolong sedation with anesthesia   Dementia  (HCC)    GERD (gastroesophageal reflux disease) 08/25/2012   tx. Prilosec   Hypercholesterolemia 08/25/2012   hx. tx. meds   Hypertension    Interstitial cystitis 08/25/2012   Has implanted Interstim Left Flank-is functional with remote   Past Surgical History:  Procedure Laterality Date   ABDOMINAL HYSTERECTOMY  08-25-12   vaginal Hysterectomy   BACK SURGERY  08-25-12   Lumbar fusion-retained hardware/cervical  fusion   [place]  08-25-12   open   INTERSTIM IMPLANT PLACEMENT  08-25-12   hx. -remains functional left flank   JOINT REPLACEMENT  08-25-12   Bil. TKA/ LT shoulder replacement   REVERSE SHOULDER ARTHROPLASTY  09/09/2012   Procedure: REVERSE SHOULDER ARTHROPLASTY;  Surgeon: Nita Sells, MD;  Location: [facility] ORS;  Service: Orthopedics;  Laterality: Left;  with left supraclavicular block   Social History:  reports that she has never smoked. She does not have any smokeless tobacco history on file. She reports that she does not drink alcohol and does not use drugs.  Allergies  Allergen Reactions   Percocet [Oxycodone-Acetaminophen] Nausea And Vomiting and Other (See Comments)    hyperactivity    Family History  Problem Relation Age of Onset   Cancer Mother    Hypertension Mother    Heart disease Brother    Cancer Maternal Grandmother    Cancer Daughter    Diabetes Neg Hx    Stroke Neg Hx     Prior to Admission medications   Medication Sig Start Date End Date  Taking? Authorizing Provider  atenolol (TENORMIN) 100 MG tablet Take 100 mg by mouth daily before breakfast.     [provider]  HYDROcodone-acetaminophen (NORCO) 7.5-325 MG per tablet 1-2 q 4-6 hours prn pain 09/11/12   Grier Mitts, PA-C  latanoprost (XALATAN) 0.005 % ophthalmic solution Place 1 drop into both eyes at bedtime.    [provider]  omeprazole (PRILOSEC) 20 MG capsule Take 20 mg by mouth daily.    [provider]   PARoxetine (PAXIL) 40 MG tablet Take 40 mg by mouth daily before breakfast.     [provider]  pravastatin (PRAVACHOL) 40 MG tablet Take 40 mg by mouth daily before breakfast.     [provider]  promethazine-codeine (PHENERGAN WITH CODEINE) 6.25-10 MG/5ML syrup Take 5 mLs by mouth every 6 (six) hours as needed. Cough    [provider]  rOPINIRole (REQUIP) 4 MG tablet Take 4 mg by mouth at bedtime.      [provider]  triamterene-hydrochlorothiazide (DYAZIDE) 37.5-25 MG per capsule Take 1 capsule by mouth daily before breakfast.     [provider]  trospium (SANCTURA) 20 MG tablet Take 20 mg by mouth 2 (two) times daily.    [provider]    Physical Exam: Vitals:   04/21/22 0612 04/21/22 0650  BP: (!) 81/66 105/61  Pulse: 83 (!) 50  SpO2: 100%    Constitutional: Confused, combative when awake. Eyes: PERRL, lids and conjunctivae normal ENMT: Mucous membranes are moist. Posterior pharynx clear of any exudate or lesions.Normal dentition.  Neck: normal, supple, no masses, no thyromegaly Respiratory: clear to auscultation bilaterally, no wheezing, no crackles. Normal respiratory effort. No accessory muscle use.  Cardiovascular: Regular rate and rhythm, no murmurs / rubs / gallops. No extremity edema. 2+ pedal pulses. No carotid bruits.  Abdomen: no tenderness, no masses palpated. No hepatosplenomegaly. Bowel sounds positive.  Musculoskeletal: no clubbing / cyanosis. No joint deformity upper and lower extremities. Good ROM, no contractures. Normal muscle tone.  Skin: Breakdown present on both legs and back side.  Excoriations on BLE. Neurologic: MAE, confused, not following commands. Psychiatric: Confused, not really verbal.  Sedated thanks to ativan and haldol in ED.  Data Reviewed:  Results are pending, will review when available.  At [facility]: T.Bili 2.4, AST 498, ALT 253, ALK 780, CRP 15.4.  Creat 0.7 with BUN 14 (not sure why  the EDP put 'acute kidney injury' in their note).  CBC not really impressive.  Lipase nl  Lactate 2.6 initially, 1.6 on repeat.  Procalcitonin 0.27  CT head normal  CT CAP shows the obstructive jaundice with pancreatic head mass as described in HPI.  INR 1.0  EKG is unreadable due to artifact (ill order another one).    Assessment and Plan: * Obstructive jaundice Pt with reportedly obstructive jaundice due to pancreatic mass, LFT and Tbili elevations at [facility].  Pancreatic mass seen on CT scan at [facility].  Transferred to [facility] for availability of GI consult. NPO IVF Repeating all labs done at [facility] order repeat imaging for now, as radiology studies done at [facility] should be able to be made visible in PACS by our radiologists [facility]ology also reads all [facility] images). Putting in for AM GI consult.  Acute encephalopathy Pt with acute encephalopathy on top of advanced dementia. Acute component either delirium (from obstructive jaundice), or hepatic encephalopathy. Getting ammonia level to eval for hepatic encephalopathy Check TSH Check UA  Dementia without behavioral disturbance ([place])  Sounds like some degree of dementia at baseline, though sounds mild from husbands description.  HTN (hypertension) Hold home BP meds. Sleeping BP is 105/61 right now.      Advance Care Planning:   Code Status: Full Code Doesn't have DNR per husband, never discussed it, so full code for now.  No DNR or MOST form on file at [facility] either per their documentation.  Though apparently has a living will as of 2017 per their documentation.  Consults: message sent to Dr. Watt Climes  Family Communication: Spoke with husband on phone: [phone]  Severity of Illness: The appropriate patient status for this patient is OBSERVATION. Observation status is judged to be reasonable and necessary in order to provide the required intensity of service to ensure the patient's safety. The patient's presenting symptoms,  physical exam findings, and initial radiographic and laboratory data in the context of their medical condition is felt to place them at decreased risk for further clinical deterioration. Furthermore, it is anticipated that the patient will be medically stable for discharge from the hospital within 2 midnights of admission.   Author: Etta Quill., DO 04/21/2022 6:14 AM  For on call review [email or web address].

## 2022-04-21 NOTE — Assessment & Plan Note (Signed)
Pt with acute encephalopathy on top of advanced dementia. 1. Acute component either delirium (from obstructive jaundice), or hepatic encephalopathy. 2. Getting ammonia level to eval for hepatic encephalopathy 3. Check TSH 4. Check UA

## 2022-04-21 NOTE — Progress Notes (Signed)
Pt arrived to floor via [facility]. Pt unable to be oriented to room, alert but not oriented at this time. Placed on telemetry. VS obtained.

## 2022-04-21 NOTE — Assessment & Plan Note (Addendum)
Hold home BP meds. Sleeping BP is 105/61 right now.

## 2022-04-21 NOTE — TOC Initial Note (Signed)
Transition of Care [facility]) - Initial/Assessment Note    Patient Details  Name: [name] MRN: [id number] Date of Birth: 1941-01-22  Transition of Care [facility]) CM/SW Contact:    Leeroy Cha, RN Phone Number: 04/21/2022, 7:16 AM  Clinical Narrative:                  Transition of Care [facility] Screening Note   Patient Details  Name: [name] Date of Birth: 1941/10/26   Transition of Care [facility]) CM/SW Contact:    Leeroy Cha, RN Phone Number: 04/21/2022, 7:16 AM    Transition of Care Department [facility]) has reviewed patient and no TOC needs have been identified at this time. We will continue to monitor patient advancement through interdisciplinary progression rounds. If new patient transition needs arise, please place a TOC consult.    Expected Discharge Plan: Home/Self Care Barriers to Discharge: Continued Medical Work up   Patient Goals and CMS Choice Patient states their goals for this hospitalization and ongoing recovery are:: unable to state husband historian but is hoh CMS Medicare.gov Compare Post Acute Care list provided to:: Patient Represenative (must comment) (husband but hoh) Choice offered to / list presented to : Spouse  Expected Discharge Plan and Services Expected Discharge Plan: Home/Self Care   Discharge Planning Services: CM Consult   Living arrangements for the past 2 months: Single Family Home                                      Prior Living Arrangements/Services Living arrangements for the past 2 months: Single Family Home Lives with:: Spouse Patient language and need for interpreter reviewed:: Yes              Criminal Activity/Legal Involvement Pertinent to Current Situation/Hospitalization: No - Comment as needed  Activities of Daily Living Home Assistive Devices/Equipment:  (UTA) ADL Screening (condition at time of admission) Patient's cognitive ability adequate to safely complete daily activities?: No Is the  patient deaf or have difficulty hearing?: Yes Does the patient have difficulty seeing, even when wearing glasses/contacts?:  (UTA) Does the patient have difficulty concentrating, remembering, or making decisions?:  (UTA) Patient able to express need for assistance with ADLs?:  (UTA) Does the patient have difficulty dressing or bathing?: Yes (U) Independently performs ADLs?: No Communication: Dependent Is this a change from baseline?:  (UTA) Dressing (OT): Dependent Is this a change from baseline?:  (UTA) Grooming: Dependent Is this a change from baseline?:  (UTA) Feeding: Dependent Is this a change from baseline?:  (UTA) Bathing: Dependent Is this a change from baseline?:  (UTA) Toileting: Dependent Is this a change from baseline?:  (uta) In/Out Bed: Dependent Is this a change from baseline?:  (UTA) Walks in Home: Dependent Is this a change from baseline?:  (UTA) Does the patient have difficulty walking or climbing stairs?: Yes Weakness of Legs: Both Weakness of Arms/Hands: Both  Permission Sought/Granted                  Emotional Assessment Appearance:: Appears stated age     Orientation: : Fluctuating Orientation (Suspected and/or reported Sundowners) Alcohol / Substance Use: Not Applicable Psych Involvement: No (comment)  Admission diagnosis:  Obstructive jaundice [K83.1] Patient Active Problem List   Diagnosis Date Noted   Obstructive jaundice 04/21/2022   Dementia without behavioral disturbance ([place]) 04/21/2022    Class: Question of   Acute  encephalopathy 04/21/2022   Pancreatic mass 04/21/2022   HTN (hypertension) 04/21/2022   Left shoulder pain 09/11/2012   PCP:  [name], MD Pharmacy:   [id number] [address] [address]Phone: [phone]: [address]Phone: [phone]:  [phone]    Social Determinants of Health (SDOH) Interventions    Readmission Risk Interventions     View : No data to display.

## 2022-04-21 NOTE — Assessment & Plan Note (Addendum)
Pt with reportedly obstructive jaundice due to pancreatic mass, LFT and Tbili elevations at [facility].  Pancreatic mass seen on CT scan at [facility].  Transferred to [facility] for availability of GI consult. 1. NPO 2. IVF 3. Repeating all labs done at [facility] 1. Wont order repeat imaging for now, as radiology studies done at [facility] should be able to be made visible in PACS by our radiologists [facility] also reads all RH images). 4. Putting in for AM GI consult.

## 2022-04-21 NOTE — Progress Notes (Signed)
Patient admitted to the hospital earlier this morning by Dr. Alcario Drought  Patient seen and examined.  She is laying in bed.  She remains very confused, cannot participate in history.  Abdomen is soft.  He does have some erythema and possible venous stasis in lower extremities bilaterally.  Lungs are clear bilaterally.   She initially presented to [facility] with worsening mental status.  Labs at that time indicated elevated LFTs.  CT scan of the abdomen was done that was concerning for 3.3 cm mass lesion, ampullary mass versus pancreatic head mass along with CBD and pancreatic ductal dilatation.  GI has been consulted.  She does have a spinal stimulator in place that would preclude any MRI studies.  GI to decide on further work-up including EGD versus ERCP/EUS  Patient's son had called requesting an update.  I did return his call and discussed patient's baseline level of functioning.  He reports that she does have significant dementia.  Reports that she has been sundowning more frequently in the last several months.  She also has worsening confusion during the day and he has noticed a progressive decline.  She does ambulate with a walker, is able to feed herself.  She still does recognize most people that she is supposed to recognize.  We discussed the severity of her potential diagnosis and briefly touched on goals of care.  He wishes to discuss with his father.  I have offered palliative care consultation, but he wishes to hold off for now, discussing with his father but may be open to have a family meeting with palliative care as further information becomes available.  [employer]

## 2022-04-21 NOTE — Assessment & Plan Note (Addendum)
Sounds like some degree of dementia at baseline, though sounds mild from husbands description.

## 2022-04-22 ENCOUNTER — Other Ambulatory Visit: Payer: Self-pay

## 2022-04-22 DIAGNOSIS — K831 Obstruction of bile duct: Secondary | ICD-10-CM | POA: Diagnosis present

## 2022-04-22 DIAGNOSIS — K2950 Unspecified chronic gastritis without bleeding: Secondary | ICD-10-CM | POA: Diagnosis present

## 2022-04-22 DIAGNOSIS — Z66 Do not resuscitate: Secondary | ICD-10-CM | POA: Diagnosis not present

## 2022-04-22 DIAGNOSIS — E162 Hypoglycemia, unspecified: Secondary | ICD-10-CM | POA: Diagnosis not present

## 2022-04-22 DIAGNOSIS — D696 Thrombocytopenia, unspecified: Secondary | ICD-10-CM | POA: Diagnosis not present

## 2022-04-22 DIAGNOSIS — R933 Abnormal findings on diagnostic imaging of other parts of digestive tract: Secondary | ICD-10-CM | POA: Diagnosis not present

## 2022-04-22 DIAGNOSIS — H9190 Unspecified hearing loss, unspecified ear: Secondary | ICD-10-CM | POA: Diagnosis present

## 2022-04-22 DIAGNOSIS — M2550 Pain in unspecified joint: Secondary | ICD-10-CM | POA: Diagnosis not present

## 2022-04-22 DIAGNOSIS — Z90710 Acquired absence of both cervix and uterus: Secondary | ICD-10-CM | POA: Diagnosis not present

## 2022-04-22 DIAGNOSIS — R748 Abnormal levels of other serum enzymes: Secondary | ICD-10-CM | POA: Diagnosis present

## 2022-04-22 DIAGNOSIS — Z0389 Encounter for observation for other suspected diseases and conditions ruled out: Secondary | ICD-10-CM | POA: Diagnosis not present

## 2022-04-22 DIAGNOSIS — G9341 Metabolic encephalopathy: Secondary | ICD-10-CM | POA: Diagnosis present

## 2022-04-22 DIAGNOSIS — K219 Gastro-esophageal reflux disease without esophagitis: Secondary | ICD-10-CM | POA: Diagnosis present

## 2022-04-22 DIAGNOSIS — Z7401 Bed confinement status: Secondary | ICD-10-CM | POA: Diagnosis not present

## 2022-04-22 DIAGNOSIS — Z9889 Other specified postprocedural states: Secondary | ICD-10-CM | POA: Diagnosis not present

## 2022-04-22 DIAGNOSIS — L89890 Pressure ulcer of other site, unstageable: Secondary | ICD-10-CM | POA: Diagnosis not present

## 2022-04-22 DIAGNOSIS — I10 Essential (primary) hypertension: Secondary | ICD-10-CM | POA: Diagnosis present

## 2022-04-22 DIAGNOSIS — R109 Unspecified abdominal pain: Secondary | ICD-10-CM | POA: Diagnosis not present

## 2022-04-22 DIAGNOSIS — D630 Anemia in neoplastic disease: Secondary | ICD-10-CM | POA: Diagnosis not present

## 2022-04-22 DIAGNOSIS — R17 Unspecified jaundice: Secondary | ICD-10-CM | POA: Diagnosis not present

## 2022-04-22 DIAGNOSIS — D509 Iron deficiency anemia, unspecified: Secondary | ICD-10-CM | POA: Diagnosis not present

## 2022-04-22 DIAGNOSIS — H269 Unspecified cataract: Secondary | ICD-10-CM | POA: Diagnosis present

## 2022-04-22 DIAGNOSIS — R1312 Dysphagia, oropharyngeal phase: Secondary | ICD-10-CM | POA: Diagnosis not present

## 2022-04-22 DIAGNOSIS — C253 Malignant neoplasm of pancreatic duct: Secondary | ICD-10-CM | POA: Diagnosis not present

## 2022-04-22 DIAGNOSIS — R7989 Other specified abnormal findings of blood chemistry: Secondary | ICD-10-CM | POA: Diagnosis present

## 2022-04-22 DIAGNOSIS — H259 Unspecified age-related cataract: Secondary | ICD-10-CM | POA: Diagnosis not present

## 2022-04-22 DIAGNOSIS — H409 Unspecified glaucoma: Secondary | ICD-10-CM | POA: Diagnosis not present

## 2022-04-22 DIAGNOSIS — Z9049 Acquired absence of other specified parts of digestive tract: Secondary | ICD-10-CM | POA: Diagnosis not present

## 2022-04-22 DIAGNOSIS — R262 Difficulty in walking, not elsewhere classified: Secondary | ICD-10-CM | POA: Diagnosis not present

## 2022-04-22 DIAGNOSIS — K838 Other specified diseases of biliary tract: Secondary | ICD-10-CM | POA: Diagnosis not present

## 2022-04-22 DIAGNOSIS — D649 Anemia, unspecified: Secondary | ICD-10-CM | POA: Diagnosis present

## 2022-04-22 DIAGNOSIS — L89899 Pressure ulcer of other site, unspecified stage: Secondary | ICD-10-CM | POA: Diagnosis present

## 2022-04-22 DIAGNOSIS — Z515 Encounter for palliative care: Secondary | ICD-10-CM | POA: Diagnosis not present

## 2022-04-22 DIAGNOSIS — F0390 Unspecified dementia without behavioral disturbance: Secondary | ICD-10-CM | POA: Diagnosis present

## 2022-04-22 DIAGNOSIS — E872 Acidosis, unspecified: Secondary | ICD-10-CM | POA: Diagnosis not present

## 2022-04-22 DIAGNOSIS — R278 Other lack of coordination: Secondary | ICD-10-CM | POA: Diagnosis not present

## 2022-04-22 DIAGNOSIS — E7800 Pure hypercholesterolemia, unspecified: Secondary | ICD-10-CM | POA: Diagnosis present

## 2022-04-22 DIAGNOSIS — M6259 Muscle wasting and atrophy, not elsewhere classified, multiple sites: Secondary | ICD-10-CM | POA: Diagnosis not present

## 2022-04-22 DIAGNOSIS — R41841 Cognitive communication deficit: Secondary | ICD-10-CM | POA: Diagnosis not present

## 2022-04-22 DIAGNOSIS — K869 Disease of pancreas, unspecified: Secondary | ICD-10-CM | POA: Diagnosis present

## 2022-04-22 DIAGNOSIS — D490 Neoplasm of unspecified behavior of digestive system: Secondary | ICD-10-CM | POA: Diagnosis present

## 2022-04-22 DIAGNOSIS — R935 Abnormal findings on diagnostic imaging of other abdominal regions, including retroperitoneum: Secondary | ICD-10-CM | POA: Diagnosis not present

## 2022-04-22 DIAGNOSIS — G9340 Encephalopathy, unspecified: Secondary | ICD-10-CM | POA: Diagnosis not present

## 2022-04-22 DIAGNOSIS — K8689 Other specified diseases of pancreas: Secondary | ICD-10-CM | POA: Diagnosis not present

## 2022-04-22 DIAGNOSIS — R932 Abnormal findings on diagnostic imaging of liver and biliary tract: Secondary | ICD-10-CM | POA: Diagnosis not present

## 2022-04-22 DIAGNOSIS — K2970 Gastritis, unspecified, without bleeding: Secondary | ICD-10-CM | POA: Diagnosis not present

## 2022-04-22 DIAGNOSIS — E8809 Other disorders of plasma-protein metabolism, not elsewhere classified: Secondary | ICD-10-CM | POA: Diagnosis not present

## 2022-04-22 DIAGNOSIS — R456 Violent behavior: Secondary | ICD-10-CM | POA: Diagnosis not present

## 2022-04-22 DIAGNOSIS — Z7189 Other specified counseling: Secondary | ICD-10-CM | POA: Diagnosis not present

## 2022-04-22 DIAGNOSIS — E785 Hyperlipidemia, unspecified: Secondary | ICD-10-CM | POA: Diagnosis present

## 2022-04-22 LAB — COMPREHENSIVE METABOLIC PANEL
ALT: 212 U/L — ABNORMAL HIGH (ref 0–44)
AST: 444 U/L — ABNORMAL HIGH (ref 15–41)
Albumin: 2.2 g/dL — ABNORMAL LOW (ref 3.5–5.0)
Alkaline Phosphatase: 566 U/L — ABNORMAL HIGH (ref 38–126)
Anion gap: 8 (ref 5–15)
BUN: 9 mg/dL (ref 8–23)
CO2: 25 mmol/L (ref 22–32)
Calcium: 8.5 mg/dL — ABNORMAL LOW (ref 8.9–10.3)
Chloride: 107 mmol/L (ref 98–111)
Creatinine, Ser: 0.62 mg/dL (ref 0.44–1.00)
GFR, Estimated: >60 mL/min (ref >60)
Glucose, Bld: 63 mg/dL — ABNORMAL LOW (ref 70–99)
Potassium: 3.6 mmol/L (ref 3.5–5.1)
Sodium: 140 mmol/L (ref 135–145)
Total Bilirubin: 3.1 mg/dL — ABNORMAL HIGH (ref 0.3–1.2)
Total Protein: 5 g/dL — ABNORMAL LOW (ref 6.5–8.1)

## 2022-04-22 LAB — GLUCOSE, CAPILLARY
Glucose-Capillary: 77 mg/dL (ref 70–99)
Glucose-Capillary: 148 mg/dL — ABNORMAL HIGH (ref 70–99)
Glucose-Capillary: 123 mg/dL — ABNORMAL HIGH (ref 70–99)

## 2022-04-22 LAB — URINALYSIS, COMPLETE (UACMP) WITH MICROSCOPIC
Bilirubin Urine: NEGATIVE
Glucose, UA: NEGATIVE mg/dL
Ketones, ur: 20 mg/dL — AB
Nitrite: NEGATIVE
Protein, ur: NEGATIVE mg/dL
Specific Gravity, Urine: 1.019 (ref 1.005–1.030)
pH: 6 (ref 5.0–8.0)

## 2022-04-22 LAB — AFP TUMOR MARKER: AFP, Serum, Tumor Marker: 2.7 ng/mL (ref 0.0–8.7)

## 2022-04-22 LAB — CBC
HCT: 37.5 % (ref 36.0–46.0)
Hemoglobin: 12.6 g/dL (ref 12.0–15.0)
MCH: 33.7 pg (ref 26.0–34.0)
MCHC: 33.6 g/dL (ref 30.0–36.0)
MCV: 100.3 fL — ABNORMAL HIGH (ref 80.0–100.0)
Platelets: 142 10*3/uL — ABNORMAL LOW (ref 150–400)
RBC: 3.74 MIL/uL — ABNORMAL LOW (ref 3.87–5.11)
RDW: 14.3 % (ref 11.5–15.5)
WBC: 6.8 10*3/uL (ref 4.0–10.5)
nRBC: 0 % (ref 0.0–0.2)

## 2022-04-22 LAB — CEA: CEA: 3.1 ng/mL (ref 0.0–4.7)

## 2022-04-22 LAB — CANCER ANTIGEN 19-9: CA 19-9: 130 U/mL — ABNORMAL HIGH (ref 0–35)

## 2022-04-22 MED ORDER — SODIUM CHLORIDE 0.9 % IV SOLN
INTRAVENOUS | Status: DC
Start: 2022-04-22 — End: 2022-04-23

## 2022-04-22 MED ORDER — DEXTROSE-NACL 5-0.9 % IV SOLN: INTRAVENOUS | Status: DC

## 2022-04-22 NOTE — Progress Notes (Signed)
PROGRESS NOTE    [name]  ZOX:[id number] DOB: [birth year] DOA: 04/21/2022 PCP: [name], MD    Brief Narrative:  81 year old female with a history of hypertension, hyperlipidemia, dementia who lives at home with her husband, brought to the hospital with worsening mental status.  She initially presented to [facility] where she was noted to have elevated LFTs.  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  Due to lack of GI coverage, she was transferred to [facility] for further work-up.  GI following.   Assessment & Plan:   Principal Problem:   Obstructive jaundice Active Problems:   Pancreatic mass   Acute encephalopathy   Dementia without behavioral disturbance (HCC)   HTN (hypertension)   Elevated liver function tests -Noted at outside hospital to have elevated liver enzymes -CT abdomen done at [facility] showed dilated biliary and pancreatic ducts with possible pancreatic head mass versus ampullary mass -GI following -Unable to do MRI since patient has spinal stimulator in place -Plans are for EGD 5/31 -Hold statin  Dementia -Discussed with patient's son and he describes that patient has fairly advanced dementia -He reports that she has been frequently sundowning at home in the last several months -She is also had worsening confusion during the day -He has noted progressive decline  Hypoglycemia -Likely since patient is n.p.o. -Started on dextrose infusion  Hypertension -Chronically on atenolol -Holding for now since she has had some episodes of bradycardia and is also currently normotensive  Hyperlipidemia -Holding statin in light of elevated LFTs  Goals of care -Try to initiate goals of care conversation with patient's son on 5/29.  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 -We will likely need to reengage with palliative care to help address goals of care once further information regarding her biliary process becomes available   DVT prophylaxis: enoxaparin (LOVENOX) injection 40 mg Start: 04/21/22 2200  Code Status: Full code Family Communication: Unable to reach patient's husband or son today Disposition Plan: Status is: Inpatient The patient will require care spanning > 2 midnights and should be moved to inpatient because: Further work-up for elevated LFTs/jaundice with EGD planned for 5/31     Consultants:  GI  Procedures:    Antimicrobials:      Subjective: Patient remains confused, repeatedly calls out for her husband.  Unable to provide further history.  Objective: Vitals:   04/21/22 1252 04/21/22 2247 04/22/22 0652 04/22/22 1255  BP: 119/84 116/66 130/75 131/69  Pulse: (!) 57 67 86 71  Resp: '16 18 19 20  '$ Temp:  98.1 F (36.7 C) 98.4 F (36.9 C) 97.6 F (36.4 C)  TempSrc:  Oral Oral   SpO2: 99% 98% 99% 100%    Intake/Output Summary (Last 24 hours) at 04/22/2022 1321 Last data filed at 04/21/2022 1400 Gross per 24 hour  Intake 492.26 ml  Output --  Net 492.26 ml   There were no vitals filed for this visit.  Examination:  General exam: Awake, confused Respiratory system: Clear to auscultation. Respiratory effort normal. Cardiovascular system: S1 & S2 heard, RRR. No JVD, murmurs, rubs, gallops or clicks. No pedal edema. Gastrointestinal system: Abdomen is nondistended, soft and nontender. No organomegaly or masses felt. Normal bowel sounds heard. Central nervous system:  No focal neurological deficits. Extremities: Symmetric 5 x 5 power. Skin: Erythema bilateral lower extremities Psychiatry: Confused, repeatedly calling out for her husband    Data Reviewed: I have personally  reviewed following labs and imaging studies  CBC: Recent Labs  Lab 04/21/22 0529 04/22/22 0254  WBC 7.9 6.8  NEUTROABS 5.7  --   HGB 12.7 12.6  HCT 38.3 37.5  MCV 102.4* 100.3*  PLT 171 681*   Basic Metabolic Panel: Recent Labs  Lab 04/21/22 0529 04/22/22 0254  NA 139 140  K 3.4* 3.6  CL 108 107  CO2 22 25  GLUCOSE 121* 63*  BUN 12 9  CREATININE 0.67 0.62  CALCIUM 8.4* 8.5*   GFR: CrCl cannot be calculated (Unknown ideal weight.). Liver Function Tests: Recent Labs  Lab 04/21/22 0529 04/22/22 0254  AST 318* 444*  ALT 201* 212*  ALKPHOS 599* 566*  BILITOT 2.6* 3.1*  PROT 5.8* 5.0*  ALBUMIN 2.6* 2.2*   No results for input(s): LIPASE, AMYLASE in the last 168 hours. Recent Labs  Lab 04/21/22 0529  AMMONIA 38*   Coagulation Profile: No results for input(s): INR, PROTIME in the last 168 hours. Cardiac Enzymes: No results for input(s): CKTOTAL, CKMB, CKMBINDEX, TROPONINI in the last 168 hours. BNP (last 3 results) No results for input(s): PROBNP in the last 8760 hours. HbA1C: No results for input(s): HGBA1C in the last 72 hours. CBG: Recent Labs  Lab 04/22/22 1248  GLUCAP 77   Lipid Profile: No results for input(s): CHOL, HDL, LDLCALC, TRIG, CHOLHDL, LDLDIRECT in the last 72 hours. Thyroid Function Tests: Recent Labs    04/21/22 0548  TSH 3.144   Anemia Panel: No results for input(s): VITAMINB12, FOLATE, FERRITIN, TIBC, IRON, RETICCTPCT in the last 72 hours. Sepsis Labs: No results for input(s): PROCALCITON, LATICACIDVEN in the last 168 hours.  No results found for this or any previous visit (from the past 240 hour(s)).       Radiology Studies: DG Lumbar Spine 1 View  Result Date: 04/21/2022 CLINICAL DATA:  Evaluate for spinal cord stimulator placement. EXAM: LUMBAR SPINE - 1 VIEW; THORACIC SPINE - 1 VIEW COMPARISON:  Chest two views 08/25/2012; CT chest abdomen and pelvis 04/20/2022 FINDINGS: Single frontal view of the thoracic spine and  single frontal view of the lumbar spine. Postsurgical changes are again seen of bilateral transpedicular rod and screw fusion hardware at L2 through S1 with associated L2-3 through L5-S1 intervertebral disc spacers. Mild dextrocurvature of the mid to lower thoracic spine. Moderate multilevel degenerative disc and endplate changes. Partial visualization of cervical spine posterior rod and screw fusion hardware and lower cervical spine ACDF hardware. Apparent plate and screws overlying the lower mandible. A spinal cord stimulator generator pack is seen overlying the left buttock and there is a single lead extending into the left S3 foramen, as on recent CT. There is prior CT IV contrast excretion into the urinary bladder. Moderate bilateral inferior sacroiliac joint space narrowing. Old healed left superior and inferior pubic ramus fractures. 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 IMPRESSION: Left buttock generator pack and single lead nerve stimulator overlying the left hemisacrum as on recent CT that demonstrated the lead tip within the left S3 foramen. Electronically Signed   By: Yvonne Kendall M.D.   On: 04/21/2022 13:10   DG Thoracic Spine 1 View  Result Date: 04/21/2022 CLINICAL DATA:  Evaluate for spinal cord stimulator placement. EXAM: LUMBAR SPINE - 1 VIEW; THORACIC SPINE -  1 VIEW COMPARISON:  Chest two views 08/25/2012; CT chest abdomen and pelvis 04/20/2022 FINDINGS: Single frontal view of the thoracic spine and single frontal view of the lumbar spine. Postsurgical changes are again seen of bilateral transpedicular rod and screw fusion hardware at L2 through S1 with associated L2-3 through L5-S1 intervertebral disc spacers. Mild dextrocurvature of the mid to lower thoracic spine. Moderate multilevel degenerative disc and endplate changes. Partial visualization of cervical spine posterior rod and screw fusion hardware and lower cervical spine ACDF  hardware. Apparent plate and screws overlying the lower mandible. A spinal cord stimulator generator pack is seen overlying the left buttock and there is a single lead extending into the left S3 foramen, as on recent CT. There is prior CT IV contrast excretion into the urinary bladder. Moderate bilateral inferior sacroiliac joint space narrowing. Old healed left superior and inferior pubic ramus fractures. 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 IMPRESSION: Left buttock generator pack and single lead nerve stimulator overlying the left hemisacrum as on recent CT that demonstrated the lead tip within the left S3 foramen. Electronically Signed   By: Yvonne Kendall M.D.   On: 04/21/2022 13:10        Scheduled Meds:  enoxaparin (LOVENOX) injection  40 mg Subcutaneous Q24H   hydrocerin   Topical Daily   Continuous Infusions:  dextrose 5 % and 0.9% NaCl 75 mL/hr at 04/22/22 1245     LOS: 1 day    Time spent: 57mns    JKathie Dike MD [facility]   If 7PM-7AM, please contact night-coverage [email or web address]  04/22/2022, 1:21 PM

## 2022-04-22 NOTE — Progress Notes (Addendum)
[facility] Gastroenterology Progress Note  [name] 81 y.o. 1941-10-12  CC:  Abnormal LFTs, abnormal CT concerning for pancreatic head mass   Subjective: Patient seen and examined at bedside. Patient was sitting comfortably in bed with soft restraints on. Disoriented.  ROS : Review of Systems  Unable to perform ROS: Dementia     Objective: Vital signs in last 24 hours: Vitals:   04/21/22 2247 04/22/22 0652  BP: 116/66 130/75  Pulse: 67 86  Resp: 18 19  Temp: 98.1 F (36.7 C) 98.4 F (36.9 C)  SpO2: 98% 99%    Physical Exam:  General:  Sedated, disoriented, comfortably in bed with soft restraints.  Head:  Normocephalic, without obvious abnormality, atraumatic  Eyes:  Anicteric sclera, EOM's intact  Lungs:   Clear to auscultation bilaterally, respirations unlabored  Heart:  Regular rate and rhythm, S1, S2 normal  Abdomen:   Soft, non-tender, bowel sounds active all four quadrants,  no masses,     Lab Results: Recent Labs    04/21/22 0529 04/22/22 0254  NA 139 140  K 3.4* 3.6  CL 108 107  CO2 22 25  GLUCOSE 121* 63*  BUN 12 9  CREATININE 0.67 0.62  CALCIUM 8.4* 8.5*   Recent Labs    04/21/22 0529 04/22/22 0254  AST 318* 444*  ALT 201* 212*  ALKPHOS 599* 566*  BILITOT 2.6* 3.1*  PROT 5.8* 5.0*  ALBUMIN 2.6* 2.2*   Recent Labs    04/21/22 0529 04/22/22 0254  WBC 7.9 6.8  NEUTROABS 5.7  --   HGB 12.7 12.6  HCT 38.3 37.5  MCV 102.4* 100.3*  PLT 171 142*   No results for input(s): LABPROT, INR in the last 72 hours.    Assessment -Abnormal CT scan concerning for 3.3 cm mass lesion at the ampulla.  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  Secondary to above.   - AST 444 (318)/ ALT 212 (201)/ Alk phos 566 (599) - T. Bili 3.1 - Spine xray: spinal stimulator    Plan: MRI MRCP not an option at this time due to history of spinal stimulator placement. Will discuss and  consider EGD for further evaluation. Continue supportive care [facility] will follow  Garnette Scheuermann PA-C 04/22/2022, 9:58 AM  Contact #  [phone]

## 2022-04-22 NOTE — H&P (View-Only) (Signed)
[facility] Gastroenterology Progress Note  [name] 81 y.o. 1941-04-13  CC:  Abnormal LFTs, abnormal CT concerning for pancreatic head mass   Subjective: Patient seen and examined at bedside. Patient was sitting comfortably in bed with soft restraints on. Disoriented.  ROS : Review of Systems  Unable to perform ROS: Dementia     Objective: Vital signs in last 24 hours: Vitals:   04/21/22 2247 04/22/22 0652  BP: 116/66 130/75  Pulse: 67 86  Resp: 18 19  Temp: 98.1 F (36.7 C) 98.4 F (36.9 C)  SpO2: 98% 99%    Physical Exam:  General:  Sedated, disoriented, comfortably in bed with soft restraints.  Head:  Normocephalic, without obvious abnormality, atraumatic  Eyes:  Anicteric sclera, EOM's intact  Lungs:   Clear to auscultation bilaterally, respirations unlabored  Heart:  Regular rate and rhythm, S1, S2 normal  Abdomen:   Soft, non-tender, bowel sounds active all four quadrants,  no masses,     Lab Results: Recent Labs    04/21/22 0529 04/22/22 0254  NA 139 140  K 3.4* 3.6  CL 108 107  CO2 22 25  GLUCOSE 121* 63*  BUN 12 9  CREATININE 0.67 0.62  CALCIUM 8.4* 8.5*   Recent Labs    04/21/22 0529 04/22/22 0254  AST 318* 444*  ALT 201* 212*  ALKPHOS 599* 566*  BILITOT 2.6* 3.1*  PROT 5.8* 5.0*  ALBUMIN 2.6* 2.2*   Recent Labs    04/21/22 0529 04/22/22 0254  WBC 7.9 6.8  NEUTROABS 5.7  --   HGB 12.7 12.6  HCT 38.3 37.5  MCV 102.4* 100.3*  PLT 171 142*   No results for input(s): LABPROT, INR in the last 72 hours.    Assessment -Abnormal CT scan concerning for 3.3 cm mass lesion at the ampulla.  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  Secondary to above.   - AST 444 (318)/ ALT 212 (201)/ Alk phos 566 (599) - T. Bili 3.1 - Spine xray: spinal stimulator    Plan: MRI MRCP not an option at this time due to history of spinal stimulator placement. Will discuss and  consider EGD for further evaluation. Continue supportive care [facility] will follow  Garnette Scheuermann PA-C 04/22/2022, 9:58 AM  Contact #  [phone]

## 2022-04-23 ENCOUNTER — Encounter (HOSPITAL_COMMUNITY): Payer: Self-pay | Admitting: Internal Medicine

## 2022-04-23 ENCOUNTER — Encounter (HOSPITAL_COMMUNITY)
Admission: AD | Disposition: A | Discharge status: Skilled Nursing Facility | Payer: Self-pay | Source: Other Acute Inpatient Hospital | Attending: Internal Medicine

## 2022-04-23 ENCOUNTER — Inpatient Hospital Stay (HOSPITAL_COMMUNITY): Payer: Medicare Other | Admitting: Anesthesiology

## 2022-04-23 DIAGNOSIS — K2950 Unspecified chronic gastritis without bleeding: Secondary | ICD-10-CM | POA: Diagnosis not present

## 2022-04-23 DIAGNOSIS — R933 Abnormal findings on diagnostic imaging of other parts of digestive tract: Secondary | ICD-10-CM | POA: Diagnosis not present

## 2022-04-23 DIAGNOSIS — Z7189 Other specified counseling: Secondary | ICD-10-CM

## 2022-04-23 DIAGNOSIS — I10 Essential (primary) hypertension: Secondary | ICD-10-CM

## 2022-04-23 DIAGNOSIS — G9340 Encephalopathy, unspecified: Secondary | ICD-10-CM | POA: Diagnosis not present

## 2022-04-23 DIAGNOSIS — R7989 Other specified abnormal findings of blood chemistry: Secondary | ICD-10-CM

## 2022-04-23 DIAGNOSIS — F0390 Unspecified dementia without behavioral disturbance: Secondary | ICD-10-CM | POA: Diagnosis not present

## 2022-04-23 DIAGNOSIS — K831 Obstruction of bile duct: Secondary | ICD-10-CM | POA: Diagnosis not present

## 2022-04-23 DIAGNOSIS — Z515 Encounter for palliative care: Secondary | ICD-10-CM

## 2022-04-23 DIAGNOSIS — K8689 Other specified diseases of pancreas: Secondary | ICD-10-CM | POA: Diagnosis not present

## 2022-04-23 HISTORY — PX: BIOPSY: SHX5522

## 2022-04-23 HISTORY — PX: ESOPHAGOGASTRODUODENOSCOPY: SHX5428

## 2022-04-23 LAB — CBC
HCT: 34.1 % — ABNORMAL LOW (ref 36.0–46.0)
Hemoglobin: 11.6 g/dL — ABNORMAL LOW (ref 12.0–15.0)
MCH: 33.2 pg (ref 26.0–34.0)
MCHC: 34 g/dL (ref 30.0–36.0)
MCV: 97.7 fL (ref 80.0–100.0)
Platelets: 159 10*3/uL (ref 150–400)
RBC: 3.49 MIL/uL — ABNORMAL LOW (ref 3.87–5.11)
RDW: 14.4 % (ref 11.5–15.5)
WBC: 6.5 10*3/uL (ref 4.0–10.5)
nRBC: 0 % (ref 0.0–0.2)

## 2022-04-23 LAB — COMPREHENSIVE METABOLIC PANEL
ALT: 219 U/L — ABNORMAL HIGH (ref 0–44)
AST: 461 U/L — ABNORMAL HIGH (ref 15–41)
Albumin: 2.2 g/dL — ABNORMAL LOW (ref 3.5–5.0)
Alkaline Phosphatase: 593 U/L — ABNORMAL HIGH (ref 38–126)
Anion gap: 10 (ref 5–15)
BUN: 9 mg/dL (ref 8–23)
CO2: 22 mmol/L (ref 22–32)
Calcium: 8.1 mg/dL — ABNORMAL LOW (ref 8.9–10.3)
Chloride: 107 mmol/L (ref 98–111)
Creatinine, Ser: 0.83 mg/dL (ref 0.44–1.00)
GFR, Estimated: >60 mL/min (ref >60)
Glucose, Bld: 115 mg/dL — ABNORMAL HIGH (ref 70–99)
Potassium: 3.6 mmol/L (ref 3.5–5.1)
Sodium: 139 mmol/L (ref 135–145)
Total Bilirubin: 3.7 mg/dL — ABNORMAL HIGH (ref 0.3–1.2)
Total Protein: 4.8 g/dL — ABNORMAL LOW (ref 6.5–8.1)

## 2022-04-23 LAB — GLUCOSE, CAPILLARY
Glucose-Capillary: 106 mg/dL — ABNORMAL HIGH (ref 70–99)
Glucose-Capillary: 112 mg/dL — ABNORMAL HIGH (ref 70–99)
Glucose-Capillary: 115 mg/dL — ABNORMAL HIGH (ref 70–99)
Glucose-Capillary: 115 mg/dL — ABNORMAL HIGH (ref 70–99)
Glucose-Capillary: 141 mg/dL — ABNORMAL HIGH (ref 70–99)

## 2022-04-23 SURGERY — EGD (ESOPHAGOGASTRODUODENOSCOPY)
Anesthesia: Monitor Anesthesia Care

## 2022-04-23 MED ORDER — LIDOCAINE HCL (CARDIAC) PF 100 MG/5ML IV SOSY
PREFILLED_SYRINGE | INTRAVENOUS | Status: DC | PRN
  Administered 2022-04-23: 50 mg via INTRAVENOUS

## 2022-04-23 MED ORDER — PHENYLEPHRINE HCL (PRESSORS) 10 MG/ML IV SOLN
INTRAVENOUS | Status: DC | PRN
  Administered 2022-04-23 (×4): 160 ug via INTRAVENOUS

## 2022-04-23 MED ORDER — EPHEDRINE SULFATE (PRESSORS) 50 MG/ML IJ SOLN
INTRAMUSCULAR | Status: DC | PRN
  Administered 2022-04-23 (×3): 5 mg via INTRAVENOUS
  Administered 2022-04-23: 10 mg via INTRAVENOUS
  Administered 2022-04-23 (×3): 5 mg via INTRAVENOUS
  Administered 2022-04-23: 10 mg via INTRAVENOUS

## 2022-04-23 MED ORDER — LACTATED RINGERS IV SOLN: INTRAVENOUS | Status: DC | PRN

## 2022-04-23 MED ORDER — PANTOPRAZOLE SODIUM 40 MG IV SOLR
40.0000 mg | INTRAVENOUS | Status: DC
  Administered 2022-04-23 – 2022-04-26 (×4): 40 mg via INTRAVENOUS
  Filled 2022-04-23 (×4): qty 10

## 2022-04-23 MED ORDER — PROPOFOL 10 MG/ML IV BOLUS
INTRAVENOUS | Status: DC | PRN
  Administered 2022-04-23: 20 mg via INTRAVENOUS
  Administered 2022-04-23: 30 mg via INTRAVENOUS
  Administered 2022-04-23 (×2): 20 mg via INTRAVENOUS
  Administered 2022-04-23: 40 mg via INTRAVENOUS
  Administered 2022-04-23: 20 mg via INTRAVENOUS
  Administered 2022-04-23: 50 mg via INTRAVENOUS
  Administered 2022-04-23 (×2): 20 mg via INTRAVENOUS

## 2022-04-23 NOTE — Consult Note (Signed)
Consultation Note Date: 04/23/2022   Patient Name: [name]  DOB: Oct 03, 1941  MRN: [id number]  Age / Sex: 81 y.o., female  PCP: [name], MD Referring Physician: [name], DO  Reason for Consultation: Establishing goals of care  HPI/Patient Profile: 81 y.o. female  with past medical history of hypertension, dementia admitted on 04/21/2022 after being transferred from [facility] due to discovery of dilated CBD with obstructing 3.3 x 3 cm mass at head of the pancreas.  Plan is for EGD today (MRI was not possible due to spinal cord stimulator).  Palliative consulted for goals of care.  Clinical Assessment and Goals of Care: I met today with [name].  She was sitting alone in bed at time of my encounter.  She is awake and alert and is pleasantly confused.  She seems to know she is in the hospital, but she cannot really tell me anything about her condition.  Relays that she is married and has 4 children.  Reports the oldest is age 42 and the youngest is age 89.  She then states she gets a little bit confused at times and is not sure if that is correct.  Discussed that her husband and one of her sons " Legrand Como, I think" help her with medical decision making.  Discussed plan for EGD and further work-up.  She does not seem concerned by this but cannot tell me why she is having any testing done.    SUMMARY OF RECOMMENDATIONS   -Full code/full scope -Plan for EGD today. -Await results of EGD.  Patient's son is indicated that he would like to have information from EGD prior to having goals of care discussion with patient, her husband (his father), and the rest of family.  [name] does not have insight or understanding of her condition. -Palliative will continue to follow and progress conversation based upon results of EGD and biopsy.  Code Status/Advance Care Planning: Full code  Prognosis:   Unable to determine  Discharge Planning: To Be Determined      Primary Diagnoses: Present on Admission:  Obstructive jaundice  Dementia without behavioral disturbance ([place])  Acute encephalopathy  Pancreatic mass  HTN (hypertension)  Elevated LFTs   I have reviewed the medical record, interviewed the patient and family, and examined the patient. The following aspects are pertinent.  Past Medical History:  Diagnosis Date   Arthritis 08/25/2012   osteoarthritis-all joints-left shoulder pain.   Cataract 08/25/2012   bilateral-not surgical yet   Complication of anesthesia 08/25/2012   prolong sedation with anesthesia   Dementia (HCC)    GERD (gastroesophageal reflux disease) 08/25/2012   tx. Prilosec   Hypercholesterolemia 08/25/2012   hx. tx. meds   Hypertension    Interstitial cystitis 08/25/2012   Has implanted Interstim Left Flank-is functional with remote   Social History   Socioeconomic History   Marital status: Married    Spouse name: Not on file   Number of children: Not on file   Years of education: Not on  file   Highest education level: Not on file  Occupational History   Not on file  Tobacco Use   Smoking status: Never   Smokeless tobacco: Not on file  Substance and Sexual Activity   Alcohol use: No   Drug use: No   Sexual activity: Not Currently  Other Topics Concern   Not on file  Social History Narrative   Not on file   Social Determinants of Health   Financial Resource Strain: Not on file  Food Insecurity: Not on file  Transportation Needs: Not on file  Physical Activity: Not on file  Stress: Not on file  Social Connections: Not on file   Family History  Problem Relation Age of Onset   Cancer Mother    Hypertension Mother    Heart disease Brother    Cancer Maternal Grandmother    Cancer Daughter    Diabetes Neg Hx    Stroke Neg Hx    Scheduled Meds:  [MAR Hold] enoxaparin (LOVENOX) injection  40 mg Subcutaneous Q24H   [MAR  Hold] hydrocerin   Topical Daily   Continuous Infusions:  sodium chloride 20 mL/hr at 04/23/22 0815   dextrose 5 % and 0.9% NaCl 75 mL/hr at 04/23/22 0130   PRN Meds:.[MAR Hold] ondansetron **OR** [MAR Hold] ondansetron (ZOFRAN) IV Medications Prior to Admission:  Prior to Admission medications   Medication Sig Start Date End Date Taking? Authorizing Provider  acetaminophen (TYLENOL) 500 MG tablet Take 500 mg by mouth every 6 (six) hours as needed for mild pain.   Yes [provider]  bismuth subsalicylate (PEPTO BISMOL) 262 MG/15ML suspension Take 30 mLs by mouth every 6 (six) hours as needed for indigestion.   Yes [provider]  atenolol (TENORMIN) 100 MG tablet Take 100 mg by mouth daily before breakfast.  Patient not taking: Reported on 04/21/2022    [provider]  latanoprost (XALATAN) 0.005 % ophthalmic solution Place 1 drop into both eyes at bedtime. Patient not taking: Reported on 04/21/2022    [provider]  PARoxetine (PAXIL) 40 MG tablet Take 40 mg by mouth daily before breakfast.  Patient not taking: Reported on 04/21/2022    [provider]  pravastatin (PRAVACHOL) 40 MG tablet Take 40 mg by mouth daily before breakfast.  Patient not taking: Reported on 04/21/2022    [provider]  rOPINIRole (REQUIP) 4 MG tablet Take 4 mg by mouth at bedtime.   Patient not taking: Reported on 04/21/2022    [provider]   Allergies  Allergen Reactions   Naproxen Sodium Other (See Comments)    GI upset   Percocet [Oxycodone-Acetaminophen] Nausea And Vomiting and Other (See Comments)    hyperactivity   Review of Systems Confused but denies all complaints  Physical Exam General: Alert, awake, in no acute distress.   HEENT: No bruits, no goiter, no JVD Heart: Regular rate and rhythm. No murmur appreciated. Lungs: Decreased air movement, clear Abdomen: Soft, nontender, nondistended, positive bowel sounds.   Ext: No  significant edema Skin: Warm and dry  Vital Signs: BP (!) 106/45   Pulse (!) 52   Temp (!) 96.6 F (35.9 C) (Temporal)   Resp 16   Ht $R'5\' 2"'uF$  (1.575 m)   Wt 62 kg   SpO2 100%   BMI 25.00 kg/m  Pain Scale: 0-10   Pain Score: Asleep   SpO2: SpO2: 100 % O2 Device:SpO2: 100 % O2 Flow Rate: .   IO: Intake/output summary:  Intake/Output Summary (Last 24 hours) at 04/23/2022 1403 Last data filed at 04/23/2022 1314 Gross per 24 hour  Intake 818.41 ml  Output --  Net 818.41 ml    LBM: Last BM Date :  (unknown) Baseline Weight: Weight: 62 kg Most recent weight: Weight: 62 kg     Palliative Assessment/Data:   Flowsheet Rows    Flowsheet Row Most Recent Value  Intake Tab   Referral Department Hospitalist  Unit at Time of Referral Med/Surg Unit  Palliative Care Primary Diagnosis Cancer  Date Notified 04/22/22  Palliative Care Type New Palliative care  Reason for referral Clarify Goals of Care  Date of Admission 04/21/22  Date first seen by Palliative Care 04/23/22  # of days Palliative referral response time 1 Day(s)  # of days IP prior to Palliative referral 1  Clinical Assessment   Palliative Performance Scale Score 50%  Psychosocial & Spiritual Assessment   Palliative Care Outcomes        Time In: 1110 Time Out: 1210 Time Total: 60 Greater than 50%  of this time was spent counseling and coordinating care related to the above assessment and plan.  Signed by: Micheline Rough, MD   Please contact Palliative Medicine Team phone at [phone] for questions and concerns.  For individual provider: See Shea Evans

## 2022-04-23 NOTE — Transfer of Care (Signed)
Immediate Anesthesia Transfer of Care Note  Patient: [name]  Procedure(s) Performed: ESOPHAGOGASTRODUODENOSCOPY (EGD) BIOPSY  Patient Location: PACU and Endoscopy Unit  Anesthesia Type:MAC  Level of Consciousness: drowsy  Airway & Oxygen Therapy: Patient Spontanous Breathing and Patient connected to face mask  Post-op Assessment: Report given to RN and Post -op Vital signs reviewed and stable  Post vital signs: Reviewed and stable  Last Vitals:  Vitals Value Taken Time  BP 109/48 04/23/22 1346  Temp    Pulse 66 04/23/22 1348  Resp 16 04/23/22 1348  SpO2 100 % 04/23/22 1348  Vitals shown include unvalidated device data.  Last Pain:  Vitals:   04/23/22 1150  TempSrc: Temporal  PainSc: 0-No pain         Complications: No notable events documented.

## 2022-04-23 NOTE — Interval H&P Note (Signed)
History and Physical Interval Note:  04/23/2022 12:09 PM  [name]  has presented today for surgery, with the diagnosis of ampullary vs pancreatic mass, unable to do MRCP.  The various methods of treatment have been discussed with the patient and family. After consideration of risks, benefits and other options for treatment, the patient has consented to  Procedure(s): ESOPHAGOGASTRODUODENOSCOPY (EGD) (N/A) as a surgical intervention.  The patient's history has been reviewed, patient examined, no change in status, stable for surgery.  I have reviewed the patient's chart and labs.  Questions were answered to the patient's satisfaction.     Cadi Rhinehart

## 2022-04-23 NOTE — TOC Progression Note (Signed)
Transition of Care [facility]) - Progression Note    Patient Details  Name: [name] MRN: [id number] Date of Birth: 17-Apr-1941  [facility] CM/SW Contact  Leeroy Cha, RN Phone Number: 04/23/2022, 7:24 AM  Clinical Narrative:    Following for toc needs.   Expected Discharge Plan: Home/Self Care Barriers to Discharge: Continued Medical Work up  Expected Discharge Plan and Services Expected Discharge Plan: Home/Self Care   Discharge Planning Services: CM Consult   Living arrangements for the past 2 months: Single Family Home                                       Social Determinants of Health (SDOH) Interventions    Readmission Risk Interventions     View : No data to display.

## 2022-04-23 NOTE — Op Note (Signed)
[facility] Patient Name: [name] Procedure Date: 04/23/2022 MRN: [id number] Attending MD: Otis Brace , MD Date of Birth: 1941-07-14 CSN: [id number] Age: 81 Admit Type: Inpatient Procedure:                Upper GI endoscopy Indications:              Abnormal CT of the GI tract, Abnormal MRI of the GI                            tract Providers:                Otis Brace, MD, Allayne Gitelman, RN, William Dalton, Technician Referring MD:              Medicines:                Sedation Administered by an Anesthesia Professional Complications:            No immediate complications. Estimated Blood Loss:     Estimated blood loss was minimal. Procedure:                Pre-Anesthesia Assessment:                           - Prior to the procedure, a History and Physical                            was performed, and patient medications and                            allergies were reviewed. The patient's tolerance of                            previous anesthesia was also reviewed. The risks                            and benefits of the procedure and the sedation                            options and risks were discussed with the patient.                            All questions were answered, and informed consent                            was obtained. Prior Anticoagulants: The patient has                            taken no previous anticoagulant or antiplatelet                            agents. ASA Grade Assessment: IV - A patient with  severe systemic disease that is a constant threat                            to life. After reviewing the risks and benefits,                            the patient was deemed in satisfactory condition to                            undergo the procedure.                           After obtaining informed consent, the endoscope was                            passed under  direct vision. Throughout the                            procedure, the patient's blood pressure, pulse, and                            oxygen saturations were monitored continuously. The                            GIF-H190 ([id number]) Olympus endoscope was introduced                            through the mouth, and advanced to the second part                            of duodenum. The upper GI endoscopy was performed                            with moderate difficulty due to abnormal anatomy.                            The patient tolerated the procedure well. Scope In: Scope Out: Findings:      The Z-line was regular and was found 37 cm from the incisors.      Diffuse moderate inflammation characterized by congestion (edema),       erosions and erythema was found in the entire examined stomach. Biopsies       were taken with a cold forceps for histology.      The cardia and gastric fundus were normal on retroflexion.      Area of ampulla appeared bulging likely from pancreatic mass pushing       towards Ampulla and duodenum. This area was evaluated with side-viewing       scope. Biopsies performed. Impression:               - Z-line regular, 37 cm from the incisors.                           - Chronic gastritis. Biopsied. Moderate Sedation:      Moderate (conscious) sedation was personally administered by an  anesthesia professional. The following parameters were monitored: oxygen       saturation, heart rate, blood pressure, and response to care. Recommendation:           - Return patient to hospital ward for ongoing care.                           - Resume previous diet.                           - Continue present medications.                           - Await pathology results. Procedure Code(s):        --- Professional ---                           [phone], Esophagogastroduodenoscopy, flexible,                            transoral; with biopsy, single or multiple Diagnosis  Code(s):        --- Professional ---                           K29.50, Unspecified chronic gastritis without                            bleeding                           R93.3, Abnormal findings on diagnostic imaging of                            other parts of digestive tract CPT copyright 2019 [employer]. All rights reserved. The codes documented in this report are preliminary and upon coder review may  be revised to meet current compliance requirements. Otis Brace, MD Otis Brace, MD 04/23/2022 1:51:40 PM Number of Addenda: 0

## 2022-04-23 NOTE — Progress Notes (Signed)
OT Cancellation Note  Patient Details Name: [name] MRN: [id number] DOB: November 30, 1940   Cancelled Treatment:    Reason Eval/Treat Not Completed: Other (comment). Patient drowsy, confused and hard to arouse. Patient NPO and scheduled for EGD. Will hold for now and f/u as able.  Bert Givans L Cruise Baumgardner 04/23/2022, 10:03 AM

## 2022-04-23 NOTE — Anesthesia Postprocedure Evaluation (Signed)
Anesthesia Post Note  Patient: [name]  Procedure(s) Performed: ESOPHAGOGASTRODUODENOSCOPY (EGD) BIOPSY     Patient location during evaluation: PACU Anesthesia Type: MAC Level of consciousness: awake and alert Pain management: pain level controlled Vital Signs Assessment: post-procedure vital signs reviewed and stable Respiratory status: spontaneous breathing, nonlabored ventilation, respiratory function stable and patient connected to nasal cannula oxygen Cardiovascular status: stable and blood pressure returned to baseline Postop Assessment: no apparent nausea or vomiting Anesthetic complications: no   No notable events documented.  Last Vitals:  Vitals:   04/23/22 1349 04/23/22 1350  BP: (!) 109/48 (!) 106/45  Pulse: (!) 58 (!) 52  Resp:  16  Temp: (!) 35.9 C   SpO2: 100% 100%    Last Pain:  Vitals:   04/23/22 1350  TempSrc:   PainSc: Asleep                 Shedrick Sarli S

## 2022-04-23 NOTE — Anesthesia Preprocedure Evaluation (Signed)
Anesthesia Evaluation  Patient identified by MRN, date of birth, ID band Patient awake    Reviewed: Allergy & Precautions, NPO status , Patient's Chart, lab work & pertinent test results  Airway Mallampati: II  TM Distance: >3 FB Neck ROM: Full    Dental no notable dental hx.    Pulmonary neg pulmonary ROS,    Pulmonary exam normal breath sounds clear to auscultation       Cardiovascular hypertension, Normal cardiovascular exam Rhythm:Regular Rate:Normal     Neuro/Psych Dementia negative neurological ROS     GI/Hepatic GERD  ,Markedly increased LFT's with pancreatic mass   Endo/Other  negative endocrine ROS  Renal/GU negative Renal ROS  negative genitourinary   Musculoskeletal negative musculoskeletal ROS (+)   Abdominal   Peds negative pediatric ROS (+)  Hematology negative hematology ROS (+)   Anesthesia Other Findings   Reproductive/Obstetrics negative OB ROS                             Anesthesia Physical Anesthesia Plan  ASA: 4  Anesthesia Plan: MAC   Post-op Pain Management: Minimal or no pain anticipated   Induction: Intravenous  PONV Risk Score and Plan: 2 and Propofol infusion and Treatment may vary due to age or medical condition  Airway Management Planned: Simple Face Mask  Additional Equipment:   Intra-op Plan:   Post-operative Plan:   Informed Consent: I have reviewed the patients History and Physical, chart, labs and discussed the procedure including the risks, benefits and alternatives for the proposed anesthesia with the patient or authorized representative who has indicated his/her understanding and acceptance.     Dental advisory given  Plan Discussed with: CRNA and Surgeon  Anesthesia Plan Comments:         Anesthesia Quick Evaluation

## 2022-04-23 NOTE — Brief Op Note (Addendum)
04/21/2022 - 04/23/2022  1:51 PM  PATIENT:  [name]  81 y.o. female  PRE-OPERATIVE DIAGNOSIS:  ampullary vs pancreatic mass, unable to do MRCP  POST-OPERATIVE DIAGNOSIS:  gastric and ampullary bx  PROCEDURE:  Procedure(s): ESOPHAGOGASTRODUODENOSCOPY (EGD) (N/A) BIOPSY  SURGEON:  Surgeon(s) and Role:    * Carsynn Bethune, MD - Primary  Findings ------------- -EGD showed bulging at the area of ampulla likely from pancreatic mass pushing towards ampulla and duodenum.  No discrete mass seen.  Biopsies performed EGD also showed gastritis.  Procedure was performed with upper endoscopy as well as side-viewing ERCP scope.  Recommendations --------------------------- -Follow biopsies -Patient would likely need EUS and possible ERCP  -Start Protonix for gastritis -GI will follow - Called family. No answer.   Otis Brace MD, [place] 04/23/2022, 1:53 PM  Contact #  [phone]

## 2022-04-23 NOTE — Consult Note (Signed)
WOC Nurse Consult Note: Consult requested for bilat legs.  This was already performed on 5/29; refer to previous [place] [facility] notes for assessment, and topical treatment orders have been provided for bedside nurses to perform as follows: Apply Eucerin cream to bilat legs Q day after bathing and roughly towel drying to assist with removal of loose dried skin. Please re-consult if further assistance is needed.  Thank-you,  Julien Girt MSN, [place], [place], [place], [place]

## 2022-04-23 NOTE — Progress Notes (Signed)
PROGRESS NOTE    LIZZIE AN  WUJ:[id number] DOB: 1940-12-20 DOA: 04/21/2022 PCP: [name], MD   Brief Narrative:  No notes on file    Assessment and Plan:  Elevated liver function tests/Abnormal LFT's Obstructive Jaundice with Hyperbilirubinemia -Noted at outside hospital to have elevated liver enzymes with concern for pancreatic mass versus a ampullary mass given no GI availability at [facility] is transferred from [facility] for further evaluation -Currently n.p.o. for EGD -CT abdomen done at [facility] showed dilated biliary and pancreatic ducts with possible pancreatic head mass versus ampullary mass -GI following and plan is for EGD today -Unable to do MRI since patient has spinal stimulator in place -Continue to Hold statin -AST went from 444 -> 461 and ALT went from 212 -> 219 -T Bili went from 3.1 -> 3.7 -CEA was 3.1, AFP serum tumor marker was 2.7 but CA 19-9 was significantly elevated at 130 -Further Care per GI -Consulted Palliative Care for further GOC Discussion    Dementia without behavioral disturbance with superimposed Acute Encephalopathy -Dr. Roderic Palau Discussed with patient's son and he describes that patient has fairly advanced dementia -He reports that she has been frequently sundowning at home in the last several months -She is also had worsening confusion during the day -He has noted progressive decline -Ammonia Level was 38 and TSH was 3.144 -Analysis was done and showed clear appearance with amber-colored urine, small hemoglobin, 20 ketones, large leukocytes, negative nitrites, pH 6.0, rare bacteria, 0-5 RBCs per high-power field, 0-5 squamous epithelial cells, 21-50 WBCs -Delirium Precautions    Hypoglycemia -Likely since patient is n.p.o. -Started on dextrose infusion and now CBG's ranging from 77-148; currently getting D5 half-normal saline at 75 MLS per hour and will continue   Hypertension -Chronically on atenolol -Holding for now since  she has had some episodes of bradycardia and is also currently normotensive -Continue to monitor blood pressures per protocol; last blood pressure reading was now slightly elevated at 146/106   Hyperlipidemia -Holding statin in light of elevated LFTs  Normocytic Anemia -Hgb/Hct went from 12.7/38.3 -> 12.6/37.5 -> 11.6/34.1 -Check Anemia Panel in the AM -Continue to Monitor for S/Sx of Bleeding; No overt bleeding noted -Repeat CBC int eh AM    Goals of care -Dr. Roderic Palau tried to initiate goals of care conversation with patient's son on 5/29.  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 -We will likely need to reengage with palliative care to help address goals of care once further information regarding her biliary process becomes available and Palliative Care has been consulted   Bilateral lower extremity wounds -WOC nurse consulted and recommending apply Eucerin cream bilaterally after bathing and roughly towel drying to assist with removal of loose dry skin   DVT prophylaxis: enoxaparin (LOVENOX) injection 40 mg Start: 04/21/22 2200    Code Status: Full Code Family Communication: No family currently at bedside  Disposition Plan:  Level of care: Med-Surg Status is: Inpatient Remains inpatient appropriate because: Needs further evaluation with endoscopic procedures and further GI clearance as well as palliative care goals of care discussion for further evaluation and discussion    Consultants:  Gastroenterology Palliative care medicine  Procedures:  EGD (to be done 04/23/22)  Antimicrobials:  Anti-infectives (From admission, onward)  None       Subjective: Seen and examined at bedside  and she is extremely confused and hard of hearing.  Wearing soft mitten restraints and appears to be pleasantly demented.  No nausea or vomiting per the patient.  Denies any complaints and denies any abdominal pain.  No other concerns or complaints this time.  Objective: Vitals:   04/22/22 0652 04/22/22 1255 04/22/22 2001 04/23/22 0647  BP: 130/75 131/69 (!) 152/96 (!) 125/98  Pulse: 86 71 100 76  Resp: '19 20 18 20  '$ Temp: 98.4 F (36.9 C) 97.6 F (36.4 C) 97.8 F (36.6 C) 98.4 F (36.9 C)  TempSrc: Oral  Oral   SpO2: 99% 100% 98% 98%    Intake/Output Summary (Last 24 hours) at 04/23/2022 0800 Last data filed at 04/22/2022 1658 Gross per 24 hour  Intake 318.41 ml  Output --  Net 318.41 ml   There were no vitals filed for this visit.  Examination: Physical Exam:  Constitutional: Thin chronically ill-appearing elderly Caucasian female who is pleasantly demented in no acute distress Respiratory: Diminished to auscultation bilaterally, no wheezing, rales, rhonchi or crackles. Normal respiratory effort and patient is not tachypenic. No accessory muscle use.  Cardiovascular: RRR, no murmurs / rubs / gallops. S1 and S2 auscultated.  Trace extremity edema Abdomen: Soft, non-tender, slightly distended secondary body habitus.. Bowel sounds positive.  GU: Deferred. Musculoskeletal: No clubbing / cyanosis of digits/nails. No joint deformity upper and lower extremities.  Skin: Bilateral lower extremities are excoriated and slightly erythematous.  She has some scratch marks and dried skin Neurologic: CN 2-12 grossly intact with no focal deficits but she is pleasantly demented Psychiatric: Impaired judgment and insight.  She is awake and alert but not oriented  Data Reviewed: I have personally reviewed following labs and imaging studies  CBC: Recent Labs  Lab 04/21/22 0529 04/22/22 0254 04/23/22 0356  WBC 7.9 6.8 6.5  NEUTROABS 5.7  --   --   HGB 12.7 12.6 11.6*  HCT 38.3 37.5 34.1*   MCV 102.4* 100.3* 97.7  PLT 171 142* 790   Basic Metabolic Panel: Recent Labs  Lab 04/21/22 0529 04/22/22 0254 04/23/22 0356  NA 139 140 139  K 3.4* 3.6 3.6  CL 108 107 107  CO2 '22 25 22  '$ GLUCOSE 121* 63* 115*  BUN '12 9 9  '$ CREATININE 0.67 0.62 0.83  CALCIUM 8.4* 8.5* 8.1*   GFR: CrCl cannot be calculated (Unknown ideal weight.). Liver Function Tests: Recent Labs  Lab 04/21/22 0529 04/22/22 0254 04/23/22 0356  AST 318* 444* 461*  ALT 201* 212* 219*  ALKPHOS 599* 566* 593*  BILITOT 2.6* 3.1* 3.7*  PROT 5.8* 5.0* 4.8*  ALBUMIN 2.6* 2.2* 2.2*   No results for input(s): LIPASE, AMYLASE in the last 168 hours. Recent Labs  Lab 04/21/22 0529  AMMONIA 38*   Coagulation Profile: No results for input(s): INR, PROTIME in the last 168 hours. Cardiac Enzymes: No results for input(s): CKTOTAL, CKMB, CKMBINDEX, TROPONINI in the last 168 hours. BNP (last 3 results) No results for input(s): PROBNP in the last 8760 hours. HbA1C: No results for input(s): HGBA1C in the last 72 hours. CBG: Recent Labs  Lab 04/22/22 1248 04/22/22 1624 04/22/22 2003 04/23/22 0007 04/23/22 0355  GLUCAP 77 148* 123* 141* 112*   Lipid Profile: No results for input(s): CHOL, HDL, LDLCALC, TRIG, CHOLHDL, LDLDIRECT in the last 72 hours. Thyroid Function Tests: Recent Labs    04/21/22 0548  TSH 3.144   Anemia  Panel: No results for input(s): VITAMINB12, FOLATE, FERRITIN, TIBC, IRON, RETICCTPCT in the last 72 hours. Sepsis Labs: No results for input(s): PROCALCITON, LATICACIDVEN in the last 168 hours.  No results found for this or any previous visit (from the past 240 hour(s)).   Radiology Studies: DG Lumbar Spine 1 View  Result Date: 04/21/2022 CLINICAL DATA:  Evaluate for spinal cord stimulator placement. EXAM: LUMBAR SPINE - 1 VIEW; THORACIC SPINE - 1 VIEW COMPARISON:  Chest two views 08/25/2012; CT chest abdomen and pelvis 04/20/2022 FINDINGS: Single frontal view of the thoracic spine  and single frontal view of the lumbar spine. Postsurgical changes are again seen of bilateral transpedicular rod and screw fusion hardware at L2 through S1 with associated L2-3 through L5-S1 intervertebral disc spacers. Mild dextrocurvature of the mid to lower thoracic spine. Moderate multilevel degenerative disc and endplate changes. Partial visualization of cervical spine posterior rod and screw fusion hardware and lower cervical spine ACDF hardware. Apparent plate and screws overlying the lower mandible. A spinal cord stimulator generator pack is seen overlying the left buttock and there is a single lead extending into the left S3 foramen, as on recent CT. There is prior CT IV contrast excretion into the urinary bladder. Moderate bilateral inferior sacroiliac joint space narrowing. Old healed left superior and inferior pubic ramus fractures. 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 IMPRESSION: Left buttock generator pack and single lead nerve stimulator overlying the left hemisacrum as on recent CT that demonstrated the lead tip within the left S3 foramen. Electronically Signed   By: Yvonne Kendall M.D.   On: 04/21/2022 13:10   DG Thoracic Spine 1 View  Result Date: 04/21/2022 CLINICAL DATA:  Evaluate for spinal cord stimulator placement. EXAM: LUMBAR SPINE - 1 VIEW; THORACIC SPINE - 1 VIEW COMPARISON:  Chest two views 08/25/2012; CT chest abdomen and pelvis 04/20/2022 FINDINGS: Single frontal view of the thoracic spine and single frontal view of the lumbar spine. Postsurgical changes are again seen of bilateral transpedicular rod and screw fusion hardware at L2 through S1 with associated L2-3 through L5-S1 intervertebral disc spacers. Mild dextrocurvature of the mid to lower thoracic spine. Moderate multilevel degenerative disc and endplate changes. Partial visualization of cervical spine posterior rod and screw fusion hardware and lower cervical spine  ACDF hardware. Apparent plate and screws overlying the lower mandible. A spinal cord stimulator generator pack is seen overlying the left buttock and there is a single lead extending into the left S3 foramen, as on recent CT. There is prior CT IV contrast excretion into the urinary bladder. Moderate bilateral inferior sacroiliac joint space narrowing. Old healed left superior and inferior pubic ramus fractures. 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 IMPRESSION: Left buttock generator pack and single lead nerve stimulator overlying the left hemisacrum as on recent CT that demonstrated the lead tip within the left S3 foramen. Electronically Signed   By: Yvonne Kendall M.D.   On: 04/21/2022 13:10     Scheduled Meds:  enoxaparin (LOVENOX) injection  40 mg Subcutaneous Q24H   hydrocerin   Topical Daily   Continuous Infusions:  sodium chloride     dextrose 5 % and 0.9% NaCl 75 mL/hr at 04/23/22 0130     LOS: 2 days   Raiford Noble, DO [facility] Available via Epic secure chat 7am-7pm After these hours, please refer to coverage provider listed on [email or web address] 04/23/2022, 8:00 AM

## 2022-04-24 DIAGNOSIS — Z515 Encounter for palliative care: Secondary | ICD-10-CM | POA: Diagnosis not present

## 2022-04-24 DIAGNOSIS — F0390 Unspecified dementia without behavioral disturbance: Secondary | ICD-10-CM | POA: Diagnosis not present

## 2022-04-24 DIAGNOSIS — G9340 Encephalopathy, unspecified: Secondary | ICD-10-CM | POA: Diagnosis not present

## 2022-04-24 DIAGNOSIS — K8689 Other specified diseases of pancreas: Secondary | ICD-10-CM | POA: Diagnosis not present

## 2022-04-24 DIAGNOSIS — R7989 Other specified abnormal findings of blood chemistry: Secondary | ICD-10-CM | POA: Diagnosis not present

## 2022-04-24 DIAGNOSIS — K831 Obstruction of bile duct: Secondary | ICD-10-CM | POA: Diagnosis not present

## 2022-04-24 LAB — COMPREHENSIVE METABOLIC PANEL
ALT: 207 U/L — ABNORMAL HIGH (ref 0–44)
AST: 388 U/L — ABNORMAL HIGH (ref 15–41)
Albumin: 2.2 g/dL — ABNORMAL LOW (ref 3.5–5.0)
Alkaline Phosphatase: 593 U/L — ABNORMAL HIGH (ref 38–126)
Anion gap: 6 (ref 5–15)
BUN: 7 mg/dL — ABNORMAL LOW (ref 8–23)
CO2: 20 mmol/L — ABNORMAL LOW (ref 22–32)
Calcium: 7.6 mg/dL — ABNORMAL LOW (ref 8.9–10.3)
Chloride: 112 mmol/L — ABNORMAL HIGH (ref 98–111)
Creatinine, Ser: 0.56 mg/dL (ref 0.44–1.00)
GFR, Estimated: >60 mL/min (ref >60)
Glucose, Bld: 105 mg/dL — ABNORMAL HIGH (ref 70–99)
Potassium: 4.8 mmol/L (ref 3.5–5.1)
Sodium: 138 mmol/L (ref 135–145)
Total Bilirubin: 4.2 mg/dL — ABNORMAL HIGH (ref 0.3–1.2)
Total Protein: 4.4 g/dL — ABNORMAL LOW (ref 6.5–8.1)

## 2022-04-24 LAB — CBC WITH DIFFERENTIAL/PLATELET
Abs Immature Granulocytes: 0.03 10*3/uL (ref 0.00–0.07)
Basophils Absolute: 0 10*3/uL (ref 0.0–0.1)
Basophils Relative: 0 %
Eosinophils Absolute: 0.2 10*3/uL (ref 0.0–0.5)
Eosinophils Relative: 3 %
HCT: 33.4 % — ABNORMAL LOW (ref 36.0–46.0)
Hemoglobin: 11.2 g/dL — ABNORMAL LOW (ref 12.0–15.0)
Immature Granulocytes: 1 %
Lymphocytes Relative: 28 %
Lymphs Abs: 1.6 10*3/uL (ref 0.7–4.0)
MCH: 32.8 pg (ref 26.0–34.0)
MCHC: 33.5 g/dL (ref 30.0–36.0)
MCV: 97.9 fL (ref 80.0–100.0)
Monocytes Absolute: 0.5 10*3/uL (ref 0.1–1.0)
Monocytes Relative: 9 %
Neutro Abs: 3.5 10*3/uL (ref 1.7–7.7)
Neutrophils Relative %: 59 %
Platelets: 150 10*3/uL (ref 150–400)
RBC: 3.41 MIL/uL — ABNORMAL LOW (ref 3.87–5.11)
RDW: 14.8 % (ref 11.5–15.5)
WBC: 5.7 10*3/uL (ref 4.0–10.5)
nRBC: 0 % (ref 0.0–0.2)

## 2022-04-24 LAB — PHOSPHORUS: Phosphorus: 1.9 mg/dL — ABNORMAL LOW (ref 2.5–4.6)

## 2022-04-24 LAB — GLUCOSE, CAPILLARY
Glucose-Capillary: 107 mg/dL — ABNORMAL HIGH (ref 70–99)
Glucose-Capillary: 109 mg/dL — ABNORMAL HIGH (ref 70–99)
Glucose-Capillary: 109 mg/dL — ABNORMAL HIGH (ref 70–99)
Glucose-Capillary: 115 mg/dL — ABNORMAL HIGH (ref 70–99)
Glucose-Capillary: 119 mg/dL — ABNORMAL HIGH (ref 70–99)
Glucose-Capillary: 123 mg/dL — ABNORMAL HIGH (ref 70–99)

## 2022-04-24 LAB — SURGICAL PATHOLOGY

## 2022-04-24 LAB — MAGNESIUM: Magnesium: 1.8 mg/dL (ref 1.7–2.4)

## 2022-04-24 MED ORDER — PIPERACILLIN-TAZOBACTAM 3.375 G IVPB 30 MIN
3.3750 g | INTRAVENOUS | Status: AC
  Administered 2022-04-25: 3.375 g via INTRAVENOUS
  Filled 2022-04-24 (×2): qty 50

## 2022-04-24 MED ORDER — K PHOS MONO-SOD PHOS DI & MONO 155-852-130 MG PO TABS
500.0000 mg | ORAL_TABLET | Freq: Two times a day (BID) | ORAL | Status: AC
  Administered 2022-04-24 (×2): 500 mg via ORAL
  Filled 2022-04-24 (×2): qty 2

## 2022-04-24 NOTE — Care Management Important Message (Signed)
Important Message  Patient Details IM Letter placed in Patients room. Name: [name] MRN: [id number] Date of Birth: 1941/07/03   Medicare Important Message Given:  Yes     Kerin Salen 04/24/2022, 9:53 AM

## 2022-04-24 NOTE — Evaluation (Signed)
Occupational Therapy Evaluation Patient Details Name: [name] MRN: [id number] DOB: 10/29/1941 Today's Date: 04/24/2022   History of Present Illness "81 year old female with a history of hypertension, hyperlipidemia, dementia who lives at home with her husband, brought to the hospital with worsening mental status.  She initially presented to [facility] where she was noted to have elevated LFTs.  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  Due to lack of GI coverage, she was transferred to [facility] for further work-up.  GI following now and took the patient for EGD and Biopsies were taken and EGD also showed gastritis.  GI speaking with the patient's family and consideration for an ERCP and family will discuss this further so she will be kept n.p.o. after midnight."   Clinical Impression   [name] is an 81 year old woman admitted to hospital with above medical history and presents with generalized weakness, decreased activity tolerance, impaired balance, baseline cognitive impairment and lower extremitypain. Patient needing assist to transfer out of bed, stand and ambulate with walker. Unsure of how much assistance she required for ADLs but she is needing max assist for ADLs. Unsure of patient's rehab potential due to impaired cognition and patient calling out for family. She is able to follow commands. Patient will benefit from skilled OT services while in hospital to improve deficits and maintain at least current functional level. Will need to determine PLOF and how much assistance she has at home. Will trial patient with therapy while in hospital. Will recommend short term rehab at this time as patient's family may not be able to provide level of needed  care.     Recommendations for follow up therapy are one component of a multi-disciplinary discharge planning process, led by the attending physician.  Recommendations may be updated based on  patient status, additional functional criteria and insurance authorization.   Follow Up Recommendations  Skilled nursing-short term rehab (<3 hours/day)    Assistance Recommended at Discharge Frequent or constant Supervision/Assistance  Patient can return home with the following A lot of help with walking and/or transfers;A lot of help with bathing/dressing/bathroom;Assistance with cooking/housework;Direct supervision/assist for medications management;Help with stairs or ramp for entrance;Direct supervision/assist for financial management    Functional Status Assessment  Patient has had a recent decline in their functional status and demonstrates the ability to make significant improvements in function in a reasonable and predictable amount of time.  Equipment Recommendations  Other (comment) (TBD)    Recommendations for Other Services       Precautions / Restrictions Precautions Precautions: Fall Precaution Comments: LE wounds, likely incontinent Restrictions Weight Bearing Restrictions: No      Mobility Bed Mobility Overal bed mobility: Needs Assistance Bed Mobility: Supine to Sit     Supine to sit: Mod assist     General bed mobility comments: assist to bring trunk upright and scoot to EOB    Transfers Overall transfer level: Needs assistance Equipment used: Rolling walker (2 wheels) Transfers: Sit to/from Stand Sit to Stand: Mod assist, +2 safety/equipment           General transfer comment: multimodal cues for hand placement and correcting posterior bias - improved with time on her feet. min assist to rise from recliner on second stand.      Balance Overall balance assessment: Needs assistance Sitting-balance support: No upper extremity supported, Feet supported Sitting balance-Leahy Scale: Fair     Standing balance support: Bilateral upper extremity supported, Reliant on  assistive device for balance Standing balance-Leahy Scale: Poor                              ADL either performed or assessed with clinical judgement   ADL Overall ADL's : Needs assistance/impaired Eating/Feeding: Set up   Grooming: Set up;Cueing for sequencing   Upper Body Bathing: Set up;Cueing for sequencing;Minimal assistance   Lower Body Bathing: Maximal assistance;Sitting/lateral leans;Cueing for sequencing   Upper Body Dressing : Sitting;Cueing for sequencing;Moderate assistance   Lower Body Dressing: Maximal assistance;Sit to/from stand;Cueing for sequencing   Toilet Transfer: Minimal assistance;+2 for safety/equipment;BSC/3in1;Rolling walker (2 wheels)   Toileting- Clothing Manipulation and Hygiene: Sit to/from stand;Maximal assistance       Functional mobility during ADLs: Minimal assistance;+2 for safety/equipment       Vision   Vision Assessment?: No apparent visual deficits     Perception     Praxis      Pertinent Vitals/Pain Pain Assessment Breathing: normal Negative Vocalization: none Facial Expression: smiling or inexpressive Body Language: relaxed Consolability: no need to console PAINAD Score: 0 Pain Intervention(s): Monitored during session     Hand Dominance Right   Extremity/Trunk Assessment Upper Extremity Assessment Upper Extremity Assessment: Overall WFL for tasks assessed (grossly functional ROM - unable to test strength. Able to use hands for bed transfer and on walker)   Lower Extremity Assessment Lower Extremity Assessment: Generalized weakness   Cervical / Trunk Assessment Cervical / Trunk Assessment: Kyphotic   Communication Communication Communication: No difficulties   Cognition Arousal/Alertness: Awake/alert Behavior During Therapy: WFL for tasks assessed/performed Overall Cognitive Status: History of cognitive impairments - at baseline                                 General Comments: hx dementia, poor short term memory, not orientated to place, frequently requesting family  (spouse and daughter)     General Comments       Exercises     Shoulder Instructions      Home Living Family/patient expects to be discharged to:: Unsure Living Arrangements: Spouse/significant other                                      Prior Functioning/Environment               Mobility Comments: pt poor historian, per chart from home with spouse and ambulatory with RW, pt's shoes with feces and urine odor ADLs Comments: patient able to feed herself        OT Problem List: Decreased activity tolerance;Impaired balance (sitting and/or standing);Decreased cognition;Decreased safety awareness;Decreased knowledge of use of DME or AE;Obesity;Pain      OT Treatment/Interventions: Self-care/ADL training;DME and/or AE instruction;Patient/family education;Cognitive remediation/compensation;Balance training;Therapeutic exercise    OT Goals(Current goals can be found in the care plan section) Acute Rehab OT Goals OT Goal Formulation: Patient unable to participate in goal setting Time For Goal Achievement: 05/08/22 Potential to Achieve Goals: Fair  OT Frequency: Min 2X/week    Co-evaluation PT/OT/SLP Co-Evaluation/Treatment: Yes Reason for Co-Treatment: To address functional/ADL transfers;For patient/therapist safety;Necessary to address cognition/behavior during functional activity PT goals addressed during session: Mobility/safety with mobility OT goals addressed during session: ADL's and self-care      AM-PAC OT "6 Clicks" Daily Activity     Outcome  Measure Help from another person eating meals?: A Little Help from another person taking care of personal grooming?: A Little Help from another person toileting, which includes using toliet, bedpan, or urinal?: A Lot Help from another person bathing (including washing, rinsing, drying)?: A Lot Help from another person to put on and taking off regular upper body clothing?: A Lot Help from another person to put  on and taking off regular lower body clothing?: A Lot 6 Click Score: 14   End of Session Equipment Utilized During Treatment: Rolling walker (2 wheels) Nurse Communication: Mobility status  Activity Tolerance: Treatment limited secondary to agitation Patient left: in chair;with call bell/phone within reach;with chair alarm set  OT Visit Diagnosis: Other symptoms and signs involving cognitive function                Time: 9983-3825 OT Time Calculation (min): 16 min Charges:  OT General Charges $OT Visit: 1 Visit OT Evaluation $OT Eval Low Complexity: 1 Low  Quint Chestnut, OTR/L [place]  Office [phone] Pager: [place] 04/24/2022, 5:41 PM

## 2022-04-24 NOTE — Evaluation (Signed)
Physical Therapy Evaluation Patient Details Name: [name] MRN: [id number] DOB: January 29, 1941 Today's Date: 04/24/2022  History of Present Illness  "81 year old female with a history of hypertension, hyperlipidemia, dementia who lives at home with her husband, brought to the hospital with worsening mental status.  She initially presented to [facility] where she was noted to have elevated LFTs.  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  Due to lack of GI coverage, she was transferred to [facility] for further work-up.  GI following now and took the patient for EGD and Biopsies were taken and EGD also showed gastritis.  GI speaking with the patient's family and consideration for an ERCP and family will discuss this further so she will be kept n.p.o. after midnight."  Clinical Impression  Pt admitted with above diagnosis. Pt currently with functional limitations due to the deficits listed below (see PT Problem List). Pt will benefit from skilled PT to increase their independence and safety with mobility to allow discharge to the venue listed below.  Pt with repeated question of "why am I here?"  "Where is my husband?" "I want to go home."  Pt assisted with ambulating and requiring at least mod assist at this time for mobility for safety and steadying.  Pt poor historian however per chart, pt from home with spouse.  Pt has questionable rehab potential however will attempt to trial physical therapy to at least maintain pt's mobility and strength in case pt and family wish for pt to return home.  Pt will likely need SNF if physical assist is not available at home,  however pt will likely thrive better in home environment.       Recommendations for follow up therapy are one component of a multi-disciplinary discharge planning process, led by the attending physician.  Recommendations may be updated based on patient status, additional functional criteria and insurance  authorization.  Follow Up Recommendations Skilled nursing-short term rehab (<3 hours/day)    Assistance Recommended at Discharge Frequent or constant Supervision/Assistance  Patient can return home with the following  A lot of help with walking and/or transfers;A lot of help with bathing/dressing/bathroom    Equipment Recommendations None recommended by PT  Recommendations for Other Services       Functional Status Assessment Patient has had a recent decline in their functional status and/or demonstrates limited ability to make significant improvements in function in a reasonable and predictable amount of time     Precautions / Restrictions Precautions Precautions: Fall Precaution Comments: LE wounds, likely incontinent      Mobility  Bed Mobility Overal bed mobility: Needs Assistance Bed Mobility: Supine to Sit     Supine to sit: Mod assist     General bed mobility comments: assist to bring trunk upright and scoot to EOB    Transfers Overall transfer level: Needs assistance Equipment used: Rolling walker (2 wheels) Transfers: Sit to/from Stand Sit to Stand: Mod assist, +2 safety/equipment           General transfer comment: multimodal cues for hand placement and correcting posterior bias    Ambulation/Gait Ambulation/Gait assistance: Mod assist, Min assist Gait Distance (Feet): 5 Feet Assistive device: Rolling walker (2 wheels) Gait Pattern/deviations: Step-through pattern, Decreased stride length, Trunk flexed, Narrow base of support       General Gait Details: requiring assist for stability, mutlimodal cues for stabilizing and use of RW, recliner following closely for safety  Stairs  Wheelchair Mobility    Modified Rankin (Stroke Patients Only)       Balance Overall balance assessment: Needs assistance         Standing balance support: Bilateral upper extremity supported, Reliant on assistive device for balance Standing  balance-Leahy Scale: Zero Standing balance comment: requring RW and mod external assist for activity                             Pertinent Vitals/Pain Pain Assessment Pain Assessment: PAINAD Breathing: normal Negative Vocalization: none Facial Expression: smiling or inexpressive Body Language: relaxed Consolability: no need to console PAINAD Score: 0 Pain Intervention(s): Monitored during session, Repositioned    Home Living Family/patient expects to be discharged to:: Unsure Living Arrangements: Spouse/significant other                      Prior Function               Mobility Comments: pt poor historian, per chart from home with spouse and ambulatory with RW, pt's shoes with feces and urine odor       Hand Dominance        Extremity/Trunk Assessment        Lower Extremity Assessment Lower Extremity Assessment: Generalized weakness    Cervical / Trunk Assessment Cervical / Trunk Assessment: Kyphotic  Communication   Communication: No difficulties  Cognition Arousal/Alertness: Awake/alert Behavior During Therapy: Flat affect Overall Cognitive Status: History of cognitive impairments - at baseline                                 General Comments: hx dementia, poor short term memory, not orientated to place, frequently requesting family (spouse and daughter)        General Comments      Exercises     Assessment/Plan    PT Assessment Patient needs continued PT services  PT Problem List Decreased strength;Decreased activity tolerance;Decreased balance;Decreased mobility;Decreased knowledge of use of DME;Decreased safety awareness;Decreased skin integrity       PT Treatment Interventions DME instruction;Gait training;Therapeutic exercise;Functional mobility training;Therapeutic activities;Patient/family education;Balance training    PT Goals (Current goals can be found in the Care Plan section)  Acute Rehab PT  Goals PT Goal Formulation: Patient unable to participate in goal setting Time For Goal Achievement: 05/08/22 Potential to Achieve Goals: Poor    Frequency Min 2X/week     Co-evaluation PT/OT/SLP Co-Evaluation/Treatment: Yes Reason for Co-Treatment: Necessary to address cognition/behavior during functional activity;For patient/therapist safety;To address functional/ADL transfers PT goals addressed during session: Mobility/safety with mobility OT goals addressed during session: ADL's and self-care       AM-PAC PT "6 Clicks" Mobility  Outcome Measure Help needed turning from your back to your side while in a flat bed without using bedrails?: A Lot Help needed moving from lying on your back to sitting on the side of a flat bed without using bedrails?: A Lot Help needed moving to and from a bed to a chair (including a wheelchair)?: A Lot Help needed standing up from a chair using your arms (e.g., wheelchair or bedside chair)?: Total Help needed to walk in hospital room?: Total Help needed climbing 3-5 steps with a railing? : Total 6 Click Score: 9    End of Session Equipment Utilized During Treatment: Gait belt Activity Tolerance: Patient tolerated treatment well Patient left: in chair;with  call bell/phone within reach;with chair alarm set;Other (comment) (mittens reapplied and floor mat placed in front of pt) Nurse Communication: Mobility status PT Visit Diagnosis: Difficulty in walking, not elsewhere classified (R26.2);Muscle weakness (generalized) (M62.81)    Time: 4327-6147 PT Time Calculation (min) (ACUTE ONLY): 16 min   Charges:   PT Evaluation $PT Eval Low Complexity: 1 Low        Kati PT, DPT Acute Rehabilitation Services Pager: [phone] Office: [place] 04/24/2022, 4:24 PM

## 2022-04-24 NOTE — Progress Notes (Signed)
PROGRESS NOTE    [name]  ERX:[id number] DOB: 12-03-1940 DOA: 04/21/2022 PCP: [name], MD   Brief Narrative:  The patient is an 81 year old female with a history of hypertension, hyperlipidemia, dementia who lives at home with her husband, brought to the hospital with worsening mental status.  She initially presented to [facility] where she was noted to have elevated LFTs.  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  Due to lack of GI coverage, she was transferred to [facility] for further work-up.  GI following now and took the patient for EGD and Biopsies were taken and EGD also showed gastritis.  GI speaking with the patient's family and consideration for an ERCP and family will discuss this further so she will be kept n.p.o. after midnight.  Assessment and Plan: Elevated liver function tests/Abnormal LFT's Obstructive Jaundice with Hyperbilirubinemia -Noted at outside hospital to have elevated liver enzymes with concern for pancreatic mass versus a ampullary mass given no GI availability at [facility] is transferred from [facility] for further evaluation -Currently n.p.o. for EGD -CT abdomen done at [facility] showed dilated biliary and pancreatic ducts with possible pancreatic head mass versus ampullary mass -GI following and plan is for EGD today -Unable to do MRI since patient has spinal stimulator in place -Continue to Hold statin -AST went from 444 -> 461 -> 388 and ALT went from 212 -> 219 -> 207 -T Bili went from 3.1 -> 3.7 -> 4.2 and worsening   -CEA was 3.1, AFP serum tumor marker was 2.7 but CA 19-9 was significantly elevated at 130 -Further Care per GI: She underwent an EGD which showed bulging at the area of the ampulla from the pancreatic mass pushing towards the ampulla of the duodenum with no discrete mass seen.  Biopsies were performed and EGD also showed gastritis so she is placed on IV pantoprazole.  GI recommending  following the biopsies and discussed the case further and they feel the EUS is not indicated at this time and the family is not interested in surgical options but they are recommending considering ERCP for further evaluation. -The GI team is discussing ERCP with the patient and patient's family and they are going to discuss with rest of family and talk over but she has a spot held for an ERCP tomorrow if they are agreeable -Consulted Palliative Care for further GOC Discussion    Dementia without behavioral disturbance with superimposed Acute Encephalopathy -Dr. Roderic Palau Discussed with patient's son and he describes that patient has fairly advanced dementia -He reports that she has been frequently sundowning at home in the last several months -She is also had worsening confusion during the day -He has noted progressive decline -Ammonia Level was 38 and TSH was 3.144 -Analysis was done and showed clear appearance with amber-colored urine, small hemoglobin, 20 ketones, large leukocytes, negative nitrites, pH 6.0, rare bacteria, 0-5 RBCs per high-power field, 0-5 squamous epithelial cells, 21-50 WBCs -Delirium Precautions    Hypoglycemia -Likely since patient is n.p.o. -Started on dextrose infusion and now CBG's ranging from 107-123; currently getting D5 half-normal saline at 75 MLS per hour and will continue   Hypertension -Chronically on atenolol -Holding for now since she has had some episodes of bradycardia and is also currently normotensive -Continue to monitor blood pressures per protocol; last blood pressure reading was now slightly elevated at 129/82   Hyperlipidemia -Holding statin in light of elevated LFTs   Normocytic Anemia -Hgb/Hct went from 12.7/38.3 ->  12.6/37.5 -> 11.6/34.1 -> 11.2/33.4 -Check Anemia Panel in the AM -Continue to Monitor for S/Sx of Bleeding; No overt bleeding noted -Repeat CBC int eh AM   Hypophosphatemia -His phosphorus level is now 1.9 -Replete with p.o.  K-Phos Neutral 500 mg p.o. twice daily x2 doses -Continue to Monitor and Replete as Necessary -Repeat Phos Level in the AM   Metabolic Acidosis -Patient CO2 is now 20, anion gap is 6, chloride level 112 -Getting IV fluid hydration with D5 normal saline at 75 MLS per hour -Continue to monitor and trend and repeat CMP in a.m.   Goals of care -Dr. Roderic Palau tried to initiate goals of care conversation with patient's son on 5/29.  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 -We will likely need to reengage with palliative care to help address goals of care once further information regarding her biliary process becomes available and Palliative Care has been consulted and will await decision for ERCP if family is agreeable and if they consent ERCP    Bilateral lower extremity wounds -WOC nurse consulted and recommending apply Eucerin cream bilaterally after bathing and roughly towel drying to assist with removal of loose dry skin  DVT prophylaxis: enoxaparin (LOVENOX) injection 40 mg Start: 04/21/22 2200    Code Status: Full Code Family Communication: No family present at bedside   Disposition Plan:  Level of care: Med-Surg Status is: Inpatient Remains inpatient appropriate because: Pending further GI workup and Palliative Care Discussions   Consultants:  Gastroenterology Palliative Care Medicine   Procedures:  EGD Findings:      The Z-line was regular and was found 37 cm from the incisors.      Diffuse moderate inflammation characterized by congestion (edema),       erosions and erythema was found in the entire examined stomach. Biopsies       were taken with a cold forceps for histology.      The  cardia and gastric fundus were normal on retroflexion.      Area of ampulla appeared bulging likely from pancreatic mass pushing       towards Ampulla and duodenum. This area was evaluated with side-viewing       scope. Biopsies performed. Impression:               - Z-line regular, 37 cm from the incisors.                           - Chronic gastritis. Biopsied. Moderate Sedation:      Moderate (conscious) sedation was personally administered by an       anesthesia professional. The following parameters were monitored: oxygen       saturation, heart rate, blood pressure, and response to care. Recommendation:           - Return patient to hospital ward for ongoing care.                           - Resume previous diet.                           - Continue present medications.                           -  Await pathology results.  Antimicrobials:  Anti-infectives (From admission, onward)    None       Subjective: Seen and examined at bedside and she remains extremely confused and agitated slightly.  Family discussing about possibly doing an ERCP in the morning.  She denies any nausea or vomiting and denies any abdominal pain.  Still remains extremely confused and unable to obtain a subjective history from the patient.  Objective: Vitals:   04/23/22 2013 04/23/22 2053 04/24/22 0630 04/24/22 1322  BP: 124/86 (!) 142/90 124/85 129/82  Pulse: (!) 110 95 (!) 101 75  Resp: '20 20 20 16  '$ Temp: 98.3 F (36.8 C) 97.7 F (36.5 C) 97.9 F (36.6 C)   TempSrc:  Oral Oral   SpO2: 97% 96% 96%   Weight:      Height:        Intake/Output Summary (Last 24 hours) at 04/24/2022 1337 Last data filed at 04/24/2022 0600 Gross per 24 hour  Intake 2348.72 ml  Output 402 ml  Net 1946.72 ml   Filed Weights   04/23/22 1150  Weight: 62 kg   Examination: Physical Exam:  Constitutional: Thin chronically ill-appearing elderly Caucasian female who is pleasantly demented and extremely confused and  agitated this morning Respiratory: Diminished to auscultation bilaterally, no wheezing, rales, rhonchi or crackles. Normal respiratory effort and patient is not tachypenic. No accessory muscle use.  Unlabored breathing Cardiovascular: RRR, no murmurs / rubs / gallops. S1 and S2 auscultated.  Trace extremity edema Abdomen: Soft, non-tender, non-distended.  Bowel sounds positive.  GU: Deferred. Musculoskeletal: No clubbing / cyanosis of digits/nails. No joint deformity upper and lower extremities.  Skin: Bilateral lower extremity extremities are excoriated and slightly erythematous from scratching and she has some scratch marks and some dry skin Neurologic: CN 2-12 grossly intact with no focal deficits but she is extremely demented Psychiatric: Impaired judgment and insight.  She is awake and agitated but not oriented at all  Data Reviewed: I have personally reviewed following labs and imaging studies  CBC: Recent Labs  Lab 04/21/22 0529 04/22/22 0254 04/23/22 0356 04/24/22 0352  WBC 7.9 6.8 6.5 5.7  NEUTROABS 5.7  --   --  3.5  HGB 12.7 12.6 11.6* 11.2*  HCT 38.3 37.5 34.1* 33.4*  MCV 102.4* 100.3* 97.7 97.9  PLT 171 142* 159 366   Basic Metabolic Panel: Recent Labs  Lab 04/21/22 0529 04/22/22 0254 04/23/22 0356 04/24/22 0352  NA 139 140 139 138  K 3.4* 3.6 3.6 4.8  CL 108 107 107 112*  CO2 '22 25 22 '$ 20*  GLUCOSE 121* 63* 115* 105*  BUN '12 9 9 '$ 7*  CREATININE 0.67 0.62 0.83 0.56  CALCIUM 8.4* 8.5* 8.1* 7.6*  MG  --   --   --  1.8  PHOS  --   --   --  1.9*   GFR: Estimated Creatinine Clearance: 46.2 mL/min (by C-G formula based on SCr of 0.56 mg/dL). Liver Function Tests: Recent Labs  Lab 04/21/22 0529 04/22/22 0254 04/23/22 0356 04/24/22 0352  AST 318* 444* 461* 388*  ALT 201* 212* 219* 207*  ALKPHOS 599* 566* 593* 593*  BILITOT 2.6* 3.1* 3.7* 4.2*  PROT 5.8* 5.0* 4.8* 4.4*  ALBUMIN 2.6* 2.2* 2.2* 2.2*   No results for input(s): LIPASE, AMYLASE in the last 168  hours. Recent Labs  Lab 04/21/22 0529  AMMONIA 38*   Coagulation Profile: No results for input(s): INR, PROTIME in the last 168 hours. Cardiac Enzymes: No results  for input(s): CKTOTAL, CKMB, CKMBINDEX, TROPONINI in the last 168 hours. BNP (last 3 results) No results for input(s): PROBNP in the last 8760 hours. HbA1C: No results for input(s): HGBA1C in the last 72 hours. CBG: Recent Labs  Lab 04/23/22 2009 04/24/22 0039 04/24/22 0351 04/24/22 0746 04/24/22 1203  GLUCAP 115* 107* 109* 123* 119*   Lipid Profile: No results for input(s): CHOL, HDL, LDLCALC, TRIG, CHOLHDL, LDLDIRECT in the last 72 hours. Thyroid Function Tests: No results for input(s): TSH, T4TOTAL, FREET4, T3FREE, THYROIDAB in the last 72 hours. Anemia Panel: No results for input(s): VITAMINB12, FOLATE, FERRITIN, TIBC, IRON, RETICCTPCT in the last 72 hours. Sepsis Labs: No results for input(s): PROCALCITON, LATICACIDVEN in the last 168 hours.  No results found for this or any previous visit (from the past 240 hour(s)).   Radiology Studies: No results found.   Scheduled Meds:  enoxaparin (LOVENOX) injection  40 mg Subcutaneous Q24H   hydrocerin   Topical Daily   pantoprazole (PROTONIX) IV  40 mg Intravenous Q24H   Continuous Infusions:  dextrose 5 % and 0.9% NaCl 75 mL/hr at 04/24/22 0903    LOS: 3 days   Raiford Noble, DO [facility] Available via Epic secure chat 7am-7pm After these hours, please refer to coverage provider listed on [email or web address] 04/24/2022, 1:37 PM

## 2022-04-24 NOTE — Progress Notes (Signed)
BRIEF PHARMACY NOTE:  [name] is an 81 yo female presenting with elevated LFTs found to have possible pancreatic head mass.  GI consulted and planning for ERCP on 6/2 @ 1300. Pharmacy is consulted to dose pre-op Zosyn prior to ERCP.  Plan - Zosyn 3.375g IV x1 ordered for 6/2  Pharmacy will sign off consult. Please re-consult if post-ERCP antibiotics still warranted.   Dimple Nanas, PharmD 04/24/2022 6:54 PM

## 2022-04-24 NOTE — Progress Notes (Signed)
Daily Progress Note   Patient Name: [name]       Date: 04/24/2022 DOB: 04-10-1941  Age: 81 y.o. MRN#: [id number] Attending Physician: [name], DO Primary Care Physician: [name], MD Admit Date: 04/21/2022  Reason for Consultation/Follow-up: Establishing goals of care  Subjective: I saw and examined [name].  She was sitting in bedside chair in no distress.  Remains confused and has no insight into her condition.  I called and spoke with her son and husband separately.  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  Discussed risk and benefit of EUS and how this would not be likely to change clinical management as she is frail and elderly and not a good candidate for disease modifying therapy.  Family expressed understanding and agreement that they would not want to pursue any surgical, radiation, or chemotherapy interventions.  We discussed ERCP with stenting for palliative purposes and family expressed that they believe this is reasonable intervention they would like to pursue.  We discussed limitations of care with recommendation for DNR/DNI in the event of cardiac or respiratory arrest.  Both son and patient's husband were in agreement.  We discussed neck steps in her care and son feels that she will likely need to go for rehab prior to trying to transition home.  We also discussed hospice benefits in detail..  Length of Stay: 3  Current Medications: Scheduled Meds:   enoxaparin (LOVENOX) injection  40 mg Subcutaneous Q24H   hydrocerin   Topical Daily   pantoprazole (PROTONIX) IV  40 mg Intravenous Q24H   phosphorus  500 mg Oral BID    Continuous Infusions:  dextrose 5 %  and 0.9% NaCl 75 mL/hr at 04/24/22 0903   [START ON 04/25/2022] piperacillin-tazobactam      PRN Meds: ondansetron **OR** ondansetron (ZOFRAN) IV  Physical Exam   Sitting in bedside chair no distress.  Frail and elderly.        Vital Signs: BP 129/82 (BP Location: Left Arm)   Pulse 75   Temp 97.9 F (36.6 C) (Oral)   Resp 16   Ht '5\' 2"'$  (1.575 m)   Wt 62 kg   SpO2 96%   BMI 25.00 kg/m  SpO2: SpO2: 96 %  O2 Device: O2 Device: Room Air O2 Flow Rate:    Intake/output summary:  Intake/Output Summary (Last 24 hours) at 04/24/2022 1927 Last data filed at 04/24/2022 1700 Gross per 24 hour  Intake 1611.52 ml  Output 802 ml  Net 809.52 ml   LBM: Last BM Date :  (unknown) Baseline Weight: Weight: 62 kg Most recent weight: Weight: 62 kg       Palliative Assessment/Data:    Flowsheet Rows    Flowsheet Row Most Recent Value  Intake Tab   Referral Department Hospitalist  Unit at Time of Referral Med/Surg Unit  Palliative Care Primary Diagnosis Cancer  Date Notified 04/22/22  Palliative Care Type New Palliative care  Reason for referral Clarify Goals of Care  Date of Admission 04/21/22  Date first seen by Palliative Care 04/23/22  # of days Palliative referral response time 1 Day(s)  # of days IP prior to Palliative referral 1  Clinical Assessment   Palliative Performance Scale Score 50%  Psychosocial & Spiritual Assessment   Palliative Care Outcomes        Patient Active Problem List   Diagnosis Date Noted   Obstructive jaundice 04/21/2022   Pancreatic mass 04/21/2022   Acute encephalopathy 04/21/2022   Dementia without behavioral disturbance ([place]) 04/21/2022   HTN (hypertension) 04/21/2022   Elevated LFTs 04/22/2022   Left shoulder pain 09/11/2012    Palliative Care Assessment & Plan   Patient Profile: 81 y.o. 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  Plan is for EGD today (MRI was not possible due to spinal cord stimulator).  Palliative consulted for goals of care.  Recommendations/Plan: DNR/DNI Family would not want to pursue EUS.  They are in agreement with plan for ERCP with stent placement.  They understand purpose of this is palliative in nature. Continue to follow her clinical course to determine best option for disposition.  In talking with family, it does not seem that she will be able to be cared for at home in her current state and may benefit from trial of rehab to see if she can regain functional status at time of discharge from the hospital.   Code Status:    Code Status Orders  (From admission, onward)           Start     Ordered   04/24/22 1822  Do not attempt resuscitation (DNR)  Continuous       Question Answer Comment  In the event of cardiac or respiratory ARREST Do not call a "code blue"   In the event of cardiac or respiratory ARREST Do not perform Intubation, CPR, defibrillation or ACLS   In the event of cardiac or respiratory ARREST Use medication by any route, position, wound care, and other measures to relive pain and suffering. May use oxygen, suction and manual treatment of airway obstruction as needed for comfort.      04/24/22 1821           Code Status History     Date Active Date Inactive Code Status Order ID Comments User Context   04/21/2022 0456 04/24/2022 1821 Full Code [id number]  Etta Quill, DO Inpatient      Discharge Planning: To Be Determined  Care plan was discussed with patient, husband, son  Thank you for allowing the Palliative Medicine Team to  assist in the care of this patient.   Time In: 1720 Time Out: 1820 Total Time 60 Prolonged Time Billed No    Anne Rough, MD  Please contact Palliative Medicine Team phone at [phone] for questions and concerns.

## 2022-04-24 NOTE — Progress Notes (Addendum)
[facility] Progress Note  [name] 81 y.o. September 24, 1941  CC:  obstructive jaundice   Subjective: Patient seen and examined at bedside. Unable to obtain history of patient.   ROS : Review of Systems  Unable to perform ROS: Dementia     Objective: Vital signs in last 24 hours: Vitals:   04/23/22 2053 04/24/22 0630  BP: (!) 142/90 124/85  Pulse: 95 (!) 101  Resp: 20 20  Temp: 97.7 F (36.5 C) 97.9 F (36.6 C)  SpO2: 96% 96%    Physical Exam:  General:  Lethargic, soft restraints, disoriented.  Head:  Normocephalic, without obvious abnormality, atraumatic  Eyes:  icteric sclera, EOM's intact  Lungs:   Clear to auscultation bilaterally, respirations unlabored  Heart:  Regular rate and rhythm, S1, S2 normal  Abdomen:   Soft, non-tender, bowel sounds active all four quadrants,  no masses,     Lab Results: Recent Labs    04/23/22 0356 04/24/22 0352  NA 139 138  K 3.6 4.8  CL 107 112*  CO2 22 20*  GLUCOSE 115* 105*  BUN 9 7*  CREATININE 0.83 0.56  CALCIUM 8.1* 7.6*  MG  --  1.8  PHOS  --  1.9*   Recent Labs    04/23/22 0356 04/24/22 0352  AST 461* 388*  ALT 219* 207*  ALKPHOS 593* 593*  BILITOT 3.7* 4.2*  PROT 4.8* 4.4*  ALBUMIN 2.2* 2.2*   Recent Labs    04/23/22 0356 04/24/22 0352  WBC 6.5 5.7  NEUTROABS  --  3.5  HGB 11.6* 11.2*  HCT 34.1* 33.4*  MCV 97.7 97.9  PLT 159 150   No results for input(s): LABPROT, INR in the last 72 hours.    Assessment -Abnormal CT scan concerning for 3.3 cm mass lesion at the ampulla.  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  Secondary to above.   - AST 388/ ALT 207/ Alk phos 593, trending down - T. Bili 4.2 (3.7) increasing. - Spine xray: spinal stimulator  - EGD 5/31: chronic gastritis  Plan: EUS not indicated at this time if family is not interested in surgical options. Could consider ERCP for further  evaluation.  Discussed ERCP with husband. Advised him or risks and benefits pertaining to the procedure. At this time, he would like to talk over with the rest of the family and give a call back either tonight or early tomorrow AM.  If consent is obtained, will proceed with ERCP tomorrow. Continue supportive care [facility] will follow  Garnette Scheuermann PA-C 04/24/2022, 9:02 AM  Contact #  [phone]

## 2022-04-25 ENCOUNTER — Encounter (HOSPITAL_COMMUNITY): Payer: Self-pay | Admitting: Internal Medicine

## 2022-04-25 ENCOUNTER — Inpatient Hospital Stay (HOSPITAL_COMMUNITY): Payer: Medicare Other | Admitting: Anesthesiology

## 2022-04-25 ENCOUNTER — Inpatient Hospital Stay (HOSPITAL_COMMUNITY): Payer: Medicare Other

## 2022-04-25 ENCOUNTER — Encounter (HOSPITAL_COMMUNITY)
Admission: AD | Disposition: A | Discharge status: Skilled Nursing Facility | Payer: Self-pay | Source: Other Acute Inpatient Hospital | Attending: Internal Medicine

## 2022-04-25 DIAGNOSIS — I10 Essential (primary) hypertension: Secondary | ICD-10-CM

## 2022-04-25 DIAGNOSIS — G9340 Encephalopathy, unspecified: Secondary | ICD-10-CM | POA: Diagnosis not present

## 2022-04-25 DIAGNOSIS — K831 Obstruction of bile duct: Secondary | ICD-10-CM | POA: Diagnosis not present

## 2022-04-25 DIAGNOSIS — R748 Abnormal levels of other serum enzymes: Secondary | ICD-10-CM

## 2022-04-25 DIAGNOSIS — R7989 Other specified abnormal findings of blood chemistry: Secondary | ICD-10-CM | POA: Diagnosis not present

## 2022-04-25 DIAGNOSIS — K838 Other specified diseases of biliary tract: Secondary | ICD-10-CM

## 2022-04-25 DIAGNOSIS — D490 Neoplasm of unspecified behavior of digestive system: Secondary | ICD-10-CM

## 2022-04-25 DIAGNOSIS — F0390 Unspecified dementia without behavioral disturbance: Secondary | ICD-10-CM | POA: Diagnosis not present

## 2022-04-25 HISTORY — PX: BILIARY BRUSHING: SHX6843

## 2022-04-25 HISTORY — PX: ERCP: SHX5425

## 2022-04-25 HISTORY — PX: BILIARY STENT PLACEMENT: SHX5538

## 2022-04-25 HISTORY — PX: SPHINCTEROTOMY: SHX5544

## 2022-04-25 LAB — COMPREHENSIVE METABOLIC PANEL
ALT: 181 U/L — ABNORMAL HIGH (ref 0–44)
AST: 318 U/L — ABNORMAL HIGH (ref 15–41)
Albumin: 2 g/dL — ABNORMAL LOW (ref 3.5–5.0)
Alkaline Phosphatase: 570 U/L — ABNORMAL HIGH (ref 38–126)
Anion gap: 4 — ABNORMAL LOW (ref 5–15)
BUN: 7 mg/dL — ABNORMAL LOW (ref 8–23)
CO2: 22 mmol/L (ref 22–32)
Calcium: 7.8 mg/dL — ABNORMAL LOW (ref 8.9–10.3)
Chloride: 114 mmol/L — ABNORMAL HIGH (ref 98–111)
Creatinine, Ser: 0.67 mg/dL (ref 0.44–1.00)
GFR, Estimated: >60 mL/min (ref >60)
Glucose, Bld: 106 mg/dL — ABNORMAL HIGH (ref 70–99)
Potassium: 3.5 mmol/L (ref 3.5–5.1)
Sodium: 140 mmol/L (ref 135–145)
Total Bilirubin: 4.9 mg/dL — ABNORMAL HIGH (ref 0.3–1.2)
Total Protein: 4.7 g/dL — ABNORMAL LOW (ref 6.5–8.1)

## 2022-04-25 LAB — CBC WITH DIFFERENTIAL/PLATELET
Abs Immature Granulocytes: 0.04 10*3/uL (ref 0.00–0.07)
Basophils Absolute: 0 10*3/uL (ref 0.0–0.1)
Basophils Relative: 1 %
Eosinophils Absolute: 0.3 10*3/uL (ref 0.0–0.5)
Eosinophils Relative: 4 %
HCT: 32 % — ABNORMAL LOW (ref 36.0–46.0)
Hemoglobin: 10.7 g/dL — ABNORMAL LOW (ref 12.0–15.0)
Immature Granulocytes: 1 %
Lymphocytes Relative: 33 %
Lymphs Abs: 2.2 10*3/uL (ref 0.7–4.0)
MCH: 32.6 pg (ref 26.0–34.0)
MCHC: 33.4 g/dL (ref 30.0–36.0)
MCV: 97.6 fL (ref 80.0–100.0)
Monocytes Absolute: 0.5 10*3/uL (ref 0.1–1.0)
Monocytes Relative: 8 %
Neutro Abs: 3.5 10*3/uL (ref 1.7–7.7)
Neutrophils Relative %: 53 %
Platelets: 127 10*3/uL — ABNORMAL LOW (ref 150–400)
RBC: 3.28 MIL/uL — ABNORMAL LOW (ref 3.87–5.11)
RDW: 15 % (ref 11.5–15.5)
WBC: 6.5 10*3/uL (ref 4.0–10.5)
nRBC: 0 % (ref 0.0–0.2)

## 2022-04-25 LAB — GLUCOSE, CAPILLARY
Glucose-Capillary: 102 mg/dL — ABNORMAL HIGH (ref 70–99)
Glucose-Capillary: 104 mg/dL — ABNORMAL HIGH (ref 70–99)
Glucose-Capillary: 105 mg/dL — ABNORMAL HIGH (ref 70–99)
Glucose-Capillary: 120 mg/dL — ABNORMAL HIGH (ref 70–99)
Glucose-Capillary: 177 mg/dL — ABNORMAL HIGH (ref 70–99)
Glucose-Capillary: 226 mg/dL — ABNORMAL HIGH (ref 70–99)
Glucose-Capillary: 233 mg/dL — ABNORMAL HIGH (ref 70–99)

## 2022-04-25 LAB — PHOSPHORUS: Phosphorus: 1.9 mg/dL — ABNORMAL LOW (ref 2.5–4.6)

## 2022-04-25 LAB — MAGNESIUM: Magnesium: 1.7 mg/dL (ref 1.7–2.4)

## 2022-04-25 SURGERY — ERCP, WITH INTERVENTION IF INDICATED
Anesthesia: General

## 2022-04-25 MED ORDER — DEXAMETHASONE SODIUM PHOSPHATE 4 MG/ML IJ SOLN
INTRAMUSCULAR | Status: DC | PRN
  Administered 2022-04-25: 8 mg via INTRAVENOUS

## 2022-04-25 MED ORDER — PROPOFOL 10 MG/ML IV BOLUS
INTRAVENOUS | Status: DC | PRN
  Administered 2022-04-25: 70 mg via INTRAVENOUS

## 2022-04-25 MED ORDER — DICLOFENAC SUPPOSITORY 100 MG
RECTAL | Status: DC | PRN
  Administered 2022-04-25: 100 mg via RECTAL

## 2022-04-25 MED ORDER — POTASSIUM PHOSPHATES 15 MMOLE/5ML IV SOLN
20.0000 mmol | Freq: Once | INTRAVENOUS | Status: AC
  Administered 2022-04-25: 20 mmol via INTRAVENOUS
  Filled 2022-04-25: qty 6.67

## 2022-04-25 MED ORDER — ONDANSETRON HCL 4 MG/2ML IJ SOLN
INTRAMUSCULAR | Status: DC | PRN
  Administered 2022-04-25: 4 mg via INTRAVENOUS

## 2022-04-25 MED ORDER — FENTANYL CITRATE (PF) 100 MCG/2ML IJ SOLN
INTRAMUSCULAR | Status: DC | PRN
  Administered 2022-04-25: 50 ug via INTRAVENOUS

## 2022-04-25 MED ORDER — PROPOFOL 10 MG/ML IV BOLUS
INTRAVENOUS | Status: AC
  Filled 2022-04-25: qty 20

## 2022-04-25 MED ORDER — LACTATED RINGERS IV SOLN: INTRAVENOUS | Status: DC | PRN

## 2022-04-25 MED ORDER — SODIUM CHLORIDE 0.9 % IV SOLN
INTRAVENOUS | Status: DC | PRN
  Administered 2022-04-25: 15 mL

## 2022-04-25 MED ORDER — FENTANYL CITRATE (PF) 100 MCG/2ML IJ SOLN
INTRAMUSCULAR | Status: AC
  Filled 2022-04-25: qty 2

## 2022-04-25 MED ORDER — MAGNESIUM SULFATE 2 GM/50ML IV SOLN
2.0000 g | Freq: Once | INTRAVENOUS | Status: AC
  Administered 2022-04-25: 2 g via INTRAVENOUS
  Filled 2022-04-25: qty 50

## 2022-04-25 MED ORDER — DICLOFENAC SUPPOSITORY 100 MG
RECTAL | Status: AC
  Filled 2022-04-25: qty 1

## 2022-04-25 MED ORDER — LIDOCAINE 2% (20 MG/ML) 5 ML SYRINGE
INTRAMUSCULAR | Status: DC | PRN
  Administered 2022-04-25: 60 mg via INTRAVENOUS

## 2022-04-25 MED ORDER — HYDRALAZINE HCL 20 MG/ML IJ SOLN
5.0000 mg | INTRAMUSCULAR | Status: DC | PRN
Start: 2022-04-25 — End: 2022-04-29
  Administered 2022-04-25: 5 mg via INTRAVENOUS
  Filled 2022-04-25: qty 1

## 2022-04-25 MED ORDER — EPHEDRINE SULFATE (PRESSORS) 50 MG/ML IJ SOLN
INTRAMUSCULAR | Status: DC | PRN
  Administered 2022-04-25: 5 mg via INTRAVENOUS

## 2022-04-25 MED ORDER — SUCCINYLCHOLINE CHLORIDE 200 MG/10ML IV SOSY
PREFILLED_SYRINGE | INTRAVENOUS | Status: DC | PRN
  Administered 2022-04-25: 40 mg via INTRAVENOUS

## 2022-04-25 NOTE — Progress Notes (Signed)
[name] 12:59 PM  Subjective: Patient seen and examined and case discussed with my partner Dr. Alessandra Bevels and her x-rays and labs reviewed and she has no specific complaints  Objective: Vital signs stable afebrile no acute distress exam please see preassessment evaluation bili slight increase other labs okay CBC stable CA 19 increased a little  Assessment: Obstructive jaundice probable pancreatic mass  Plan: My partner discussed the procedure with the family who is elected to proceed with further work-up and plans pending those findings  [facility]  office [phone] After 5PM or if no answer call [phone]

## 2022-04-25 NOTE — NC FL2 (Signed)
  [place] LEVEL OF CARE SCREENING TOOL     IDENTIFICATION  Patient Name: [name] Birthdate: [birth year] Sex: female Admission Date (Current Location): 04/21/2022  [facility] and [place] Number:  Herbalist and Address:  [facility],  [place], [place]      Provider Number: [id number]  Attending Physician Name and Address:  [name], DO  Relative Name and Phone Number:       Current Level of Care: Hospital Recommended Level of Care: [place] Prior Approval Number:    Date Approved/Denied:   PASRR Number: [id number]  Discharge Plan: SNF    Current Diagnoses: Patient Active Problem List   Diagnosis Date Noted   Elevated LFTs 04/22/2022   Obstructive jaundice 04/21/2022   Dementia without behavioral disturbance ([place]) 04/21/2022   Acute encephalopathy 04/21/2022   Pancreatic mass 04/21/2022   HTN (hypertension) 04/21/2022   Left shoulder pain 09/11/2012    Orientation RESPIRATION BLADDER Height & Weight     Self, Time, Situation, Place  Normal Continent Weight: 62 kg Height:  '5\' 2"'$  (157.5 cm)  BEHAVIORAL SYMPTOMS/MOOD NEUROLOGICAL BOWEL NUTRITION STATUS      Continent Diet (regular)  AMBULATORY STATUS COMMUNICATION OF NEEDS Skin   Extensive Assist Verbally Normal                       Personal Care Assistance Level of Assistance  Bathing, Feeding, Dressing Bathing Assistance: Limited assistance Feeding assistance: Limited assistance Dressing Assistance: Limited assistance     Functional Limitations Info  Sight, Hearing, Speech Sight Info: Adequate Hearing Info: Adequate Speech Info: Adequate    SPECIAL CARE FACTORS FREQUENCY  PT (By licensed PT), OT (By licensed OT)     PT Frequency: 5xweekly OT Frequency: 5xweekly            Contractures Contractures Info: Not present    Additional Factors Info  Code Status Code Status Info: DNR              Current Medications (04/25/2022):  This is the current hospital active medication list Current Facility-Administered Medications  Medication Dose Route Frequency Provider Last Rate Last Admin   dextrose 5 %-0.9 % sodium chloride infusion   Intravenous Continuous Kathie Dike, MD 75 mL/hr at 04/24/22 2147 New Bag at 04/24/22 2147   enoxaparin (LOVENOX) injection 40 mg  40 mg Subcutaneous Q24H Jennette Kettle M, DO   40 mg at 04/24/22 2231   hydrocerin (EUCERIN) cream   Topical Daily Kathie Dike, MD   Given at 04/24/22 1250   ondansetron (ZOFRAN) tablet 4 mg  4 mg Oral Q6H PRN Etta Quill, DO       Or   ondansetron [facility]) injection 4 mg  4 mg Intravenous Q6H PRN Etta Quill, DO       pantoprazole (PROTONIX) injection 40 mg  40 mg Intravenous Q24H Brahmbhatt, Parag, MD   40 mg at 04/24/22 1500   piperacillin-tazobactam (ZOSYN) IVPB 3.375 g  3.375 g Intravenous On Call to OR Dimple Nanas, [facility]         Discharge Medications: Please see discharge summary for a list of discharge medications.  Relevant Imaging Results:  Relevant Lab Results:   Additional Information [id number]  Leeroy Cha, RN

## 2022-04-25 NOTE — Op Note (Signed)
[facility] Patient Name: [name] Procedure Date: 04/25/2022 MRN: [id number] Attending MD: Clarene Essex , MD Date of Birth: [birth year] CSN: [id number] Age: 81 Admit Type: Inpatient Procedure:                ERCP Indications:              Biliary dilation on Computed Tomogram Scan,                            Jaundice, Elevated liver enzymes, Tumor of the head                            of pancreas Providers:                Clarene Essex, MD, Burtis Junes, RN, Cherylynn Ridges,                            Technician, Darliss Cheney, Technician Referring MD:              Medicines:                General Anesthesia Complications:            No immediate complications. Estimated Blood Loss:     Estimated blood loss: none. Procedure:                Pre-Anesthesia Assessment:                           - Prior to the procedure, a History and Physical                            was performed, and patient medications and                            allergies were reviewed. The patient's tolerance of                            previous anesthesia was also reviewed. The risks                            and benefits of the procedure and the sedation                            options and risks were discussed with the patient.                            All questions were answered, and informed consent                            was obtained. Prior Anticoagulants: The patient has                            taken no previous anticoagulant or antiplatelet                            agents. ASA Grade  Assessment: III - A patient with                            severe systemic disease. After reviewing the risks                            and benefits, the patient was deemed in                            satisfactory condition to undergo the procedure.                           After obtaining informed consent, the scope was                            passed under direct vision. Throughout the                             procedure, the patient's blood pressure, pulse, and                            oxygen saturations were monitored continuously. The                            [id number]-Q190V ([id number] Olympus duodenoscope was                            introduced through the mouth, and used to inject                            contrast into and used to cannulate the bile duct.                            The ERCP was accomplished without difficulty. The                            patient tolerated the procedure well. Scope In: Scope Out: Findings:      The major papilla was slightly bulging. Deep selective cannulation was       readily obtained on the first attempt and an obvious distal stricture       was seen in the CBD was dilated above the stricture and we proceeded       with a biliary sphincterotomy was made with a Hydratome sphincterotome       using ERBE electrocautery. There was no post-sphincterotomy bleeding. We       proceeded until we had adequate biliary drainage and could easily get       the sphincterotome in and out of the duct and cells for cytology were       obtained by brushing in the lower third of the main bile duct mostly of       the stricture in the customary fashion. One 10 Fr by 6 cm covered metal       stent with no external flaps and no internal flaps was placed 5 cm into       the  common bile duct. Bile flowed through the stent. The stent was in       good position. The wire and introducer were removed and the scope was       removed and there was no pancreatic duct injection or wire advancement       throughout the procedure and the patient tolerated the procedure well       and there was no obvious immediate complication Impression:               - The major papilla appeared to be slightly bulging.                           - A biliary sphincterotomy was performed.                           - Cells for cytology obtained in the lower third of                             the main duct.                           - One covered metal stent was placed into the                            common bile duct. Moderate Sedation:      Not Applicable - Patient had care per Anesthesia. Recommendation:           - Clear liquid diet for 6 hours. If doing well this                            evening may slowly advance to soft diet and slowly                            advance tomorrow                           - Continue present medications.                           - Check liver enzymes (AST, ALT, alkaline                            phosphatase, bilirubin) tomorrow. And follow back                            to normal as an outpatient and her primary doctor                            could do this                           - Await cytology results. Consider oncology consult                            for possible chemo or radiation                           -  Return to GI clinic PRN.                           - Telephone GI clinic for pathology results in 1                            week.                           - Telephone GI clinic if symptomatic PRN. Procedure Code(s):        --- Professional ---                           [phone], Endoscopic retrograde                            cholangiopancreatography (ERCP); with placement of                            endoscopic stent into biliary or pancreatic duct,                            including pre- and post-dilation and guide wire                            passage, when performed, including sphincterotomy,                            when performed, each stent Diagnosis Code(s):        --- Professional ---                           K83.8, Other specified diseases of biliary tract                           R17, Unspecified jaundice                           R74.8, Abnormal levels of other serum enzymes                           D49.0, Neoplasm of unspecified behavior of                            digestive  system CPT copyright 2019 [employer]. All rights reserved. The codes documented in this report are preliminary and upon coder review may  be revised to meet current compliance requirements. Clarene Essex, MD 04/25/2022 2:06:36 PM This report has been signed electronically. Number of Addenda: 0

## 2022-04-25 NOTE — Progress Notes (Signed)
-   Received message from Dr. Domingo Cocking yesterday that family wants to pursue ERCP with possible stent placement  -She is scheduled for today at 1 PM with Dr. Watt Climes.  Otis Brace MD, [place] 04/25/2022, 8:33 AM  Contact #  [phone]

## 2022-04-25 NOTE — Progress Notes (Signed)
PROGRESS NOTE    [name]  JIR:[id number] DOB: November 12, 1941 DOA: 04/21/2022 PCP: [name], MD   Brief Narrative:  The patient is an 81 year old female with a history of hypertension, hyperlipidemia, dementia who lives at home with her husband, brought to the hospital with worsening mental status.  She initially presented to [facility] where she was noted to have elevated LFTs.  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  Due to lack of GI coverage, she was transferred to [facility] for further work-up.  GI following now and took the patient for EGD and Biopsies were taken and EGD also showed gastritis.  GI speaking with the patient's family and consideration for an ERCP and family and they are agreeable so she underwent ERCP today by Dr. Lizbeth Bark.   Assessment and Plan: Elevated liver function tests/Abnormal LFT's Obstructive Jaundice with Hyperbilirubinemia -Noted at outside hospital to have elevated liver enzymes with concern for pancreatic mass versus a ampullary mass given no GI availability at [facility] is transferred from [facility] for further evaluation -Currently n.p.o. for EGD -CT abdomen done at [facility] showed dilated biliary and pancreatic ducts with possible pancreatic head mass versus ampullary mass -GI following and plan is for EGD today -Unable to do MRI since patient has spinal stimulator in place -Continue to Hold statin -AST went from 444 -> 461 -> 388 -> 318 and ALT went from 212 -> 219 -> 207 -> 181 -T Bili went from 3.1 -> 3.7 -> 4.2 -> 4.9 and worsening   -CEA was 3.1, AFP serum tumor marker was 2.7 but CA 19-9 was significantly elevated at 130 -Further Care per GI: She underwent an EGD which showed bulging at the area of the ampulla from the pancreatic mass pushing towards the ampulla of the duodenum with no discrete mass seen.  Biopsies were performed and EGD also showed gastritis so she is placed on IV  pantoprazole.  GI recommending following the biopsies and discussed the case further and they feel the EUS is not indicated at this time and the family is not interested in surgical options but they are recommending considering ERCP for further evaluation. -The GI team is discussing ERCP with the patient and patient's family and they are going to discuss with rest of family and talk over but she has a spot held for an ERCP tomorrow if they are agreeable; patient underwent ERCP today and showed that the major papilla was slightly bulging and deep selective cannulation was will be obtained first attempt.  She did biliary sphincterotomy that was performed and cells for cytology were obtained in the lower third of the main duct and a covered metal stent was placed into the common bile duct. -GI recommending a clear liquid diet for 6 hours and then advance diet as tolerated slowly -GI recommending awaiting cytology results and following up with the GI clinic as needed -Consulted Palliative Care for further GOC Discussion    Dementia without behavioral disturbance with superimposed Acute Encephalopathy -Dr. Roderic Palau Discussed with patient's son and he describes that patient has fairly advanced dementia -He reports that she has been frequently sundowning at home in the last several months -She is also had worsening confusion during the day -He has noted progressive decline -Ammonia Level was 38 and TSH was 3.144 -Analysis was done and showed clear appearance with amber-colored urine, small hemoglobin, 20 ketones, large leukocytes, negative nitrites, pH 6.0, rare bacteria, 0-5 RBCs per high-power field, 0-5 squamous  epithelial cells, 21-50 WBCs -Delirium Precautions    Hypoglycemia -Likely since patient is n.p.o. -Started on dextrose infusion and now CBG's ranging from 107-123; currently getting D5 half-normal saline at 75 MLS per hour and will continue for now until she is actually properly eating    Hypertension -Chronically on atenolol -Holding for now since she has had some episodes of bradycardia and is also currently normotensive -Continue to monitor blood pressures per protocol; last blood pressure reading was now slightly elevated at 167/106   Hyperlipidemia -Holding statin in light of elevated LFTs   Normocytic Anemia -Hgb/Hct went from 12.7/38.3 -> 12.6/37.5 -> 11.6/34.1 -> 11.2/33.4 and is now 10.7/32.0 -Check Anemia Panel in the AM -Continue to Monitor for S/Sx of Bleeding; No overt bleeding noted -Repeat CBC int eh AM    Hypophosphatemia -His phosphorus level is now 1.9 again -Replete with p.o. K-Phos Neutral 500 mg p.o. twice daily x2 doses yesterday and will replete with IV K-Phos 20 mmol today -Continue to Monitor and Replete as Necessary -Repeat Phos Level in the AM    Metabolic Acidosis -Patient CO2 is now 22, anion gap is 4, chloride level 114 -Getting IV fluid hydration with D5 normal saline at 75 MLS per hour -Continue to monitor and trend and repeat CMP in a.m.   Goals of care -Dr. Roderic Palau tried to initiate goals of care conversation with patient's son on 5/29.  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 -We will likely need to reengage with palliative care to help address goals of care once further information regarding her biliary process becomes available and Palliative Care has been consulted and will await decision for ERCP if family is agreeable and if they consent ERCP   Hypoalbuminemia -Patient's albumin level is now 2.0 -Continue monitor and trend and repeat CMP in a.m.  Thrombocytopenia -Patient's platelet count went from 159 and trended up to 127 -Continue to  monitor for signs and symptoms of bleeding; no overt bleeding noted -Repeat CBC in the AM   Bilateral lower extremity wounds -WOC nurse consulted and recommending apply Eucerin cream bilaterally after bathing and roughly towel drying to assist with removal of loose dry skin  DVT prophylaxis: enoxaparin (LOVENOX) injection 40 mg Start: 04/21/22 2200    Code Status: DNR Family Communication: No family currently at bedside  Disposition Plan:  Level of care: Med-Surg Status is: Inpatient Remains inpatient appropriate because: Just underwent ERCP will need further GI clearance and continued palliative care goals of care discussion.  PT OT recommending SNF  Consultants:  Palliative care medicine Gastroenterology  Procedures:  EGD Findings:      The Z-line was regular and was found 37 cm from the incisors.      Diffuse moderate inflammation characterized by congestion (edema),       erosions and erythema was found in the entire examined stomach. Biopsies       were taken with a cold forceps for histology.      The cardia and gastric fundus were normal on retroflexion.      Area of ampulla appeared bulging likely from pancreatic mass pushing       towards Ampulla and duodenum. This area was evaluated with side-viewing       scope. Biopsies performed. Impression:               -  Z-line regular, 37 cm from the incisors.                           - Chronic gastritis. Biopsied. Moderate Sedation:      Moderate (conscious) sedation was personally administered by an       anesthesia professional. The following parameters were monitored: oxygen       saturation, heart rate, blood pressure, and response to care. Recommendation:           - Return patient to hospital ward for ongoing care.                           - Resume previous diet.                           - Continue present medications.  ERCP Findings:      The major papilla was slightly bulging. Deep selective cannulation was        readily obtained on the first attempt and an obvious distal stricture       was seen in the CBD was dilated above the stricture and we proceeded       with a biliary sphincterotomy was made with a Hydratome sphincterotome       using ERBE electrocautery. There was no post-sphincterotomy bleeding. We       proceeded until we had adequate biliary drainage and could easily get       the sphincterotome in and out of the duct and cells for cytology were       obtained by brushing in the lower third of the main bile duct mostly of       the stricture in the customary fashion. One 10 Fr by 6 cm covered metal       stent with no external flaps and no internal flaps was placed 5 cm into       the common bile duct. Bile flowed through the stent. The stent was in       good position. The wire and introducer were removed and the scope was       removed and there was no pancreatic duct injection or wire advancement       throughout the procedure and the patient tolerated the procedure well       and there was no obvious immediate complication Impression:               - The major papilla appeared to be slightly bulging.                           - A biliary sphincterotomy was performed.                           - Cells for cytology obtained in the lower third of                            the main duct.                           - One covered metal stent was placed into the  common bile duct. Moderate Sedation:      Not Applicable - Patient had care per Anesthesia. Recommendation:           - Clear liquid diet for 6 hours. If doing well this                            evening may slowly advance to soft diet and slowly                            advance tomorrow                           - Continue present medications.                           - Check liver enzymes (AST, ALT, alkaline                            phosphatase, bilirubin) tomorrow. And follow back                             to normal as an outpatient and her primary doctor                            could do this                           - Await cytology results. Consider oncology consult                            for possible chemo or radiation                           - Return to GI clinic PRN.                           - Telephone GI clinic for pathology results in 1                            week.                           - Telephone GI clinic if symptomatic PRN.  Antimicrobials:  Anti-infectives (From admission, onward)    Start     Dose/Rate Route Frequency Ordered Stop   04/25/22 1230  piperacillin-tazobactam (ZOSYN) IVPB 3.375 g        3.375 g 100 mL/hr over 30 Minutes Intravenous On call to O.R. 04/24/22 1852 04/25/22 1338       Subjective: Seen and examined at bedside and states that she is "not doing good".  Unable to clarify why she is not feeling well.  States that she has some mild abdominal discomfort but cannot specifically localize.  No nausea or vomiting.  Appears fatigued.  Remains pleasantly confused and demented.  No other concerns or complaint this time  Objective: Vitals:   04/25/22 0546 04/25/22 1240 04/25/22 1408 04/25/22 1428  BP: (!) 134/48 (!) 162/104 (!) 184/108 Marland Kitchen)  167/106  Pulse: 65 72 78 68  Resp: 16 13 (!) 22 11  Temp: 97.8 F (36.6 C)  (!) 97.1 F (36.2 C)   TempSrc: Oral  Temporal   SpO2: 99% 98% 100% 94%  Weight:  62 kg    Height:  '5\' 2"'$  (1.575 m)      Intake/Output Summary (Last 24 hours) at 04/25/2022 1701 Last data filed at 04/25/2022 1404 Gross per 24 hour  Intake 1436 ml  Output 750 ml  Net 686 ml   Filed Weights   04/23/22 1150 04/25/22 1240  Weight: 62 kg 62 kg   Examination: Physical Exam:  Constitutional: Thin chronically ill-appearing elderly Caucasian female who is pleasantly demented still and slightly agitated Respiratory: Diminished to auscultation bilaterally, no wheezing, rales, rhonchi or crackles. Normal respiratory  effort and patient is not tachypenic. No accessory muscle use.  Unlabored breathing Cardiovascular: RRR, no murmurs / rubs / gallops. S1 and S2 auscultated. No extremity edema.  Abdomen: Soft, non-tender, non-distended. Bowel sounds positive.  GU: Deferred.  Skin: She has bilateral lower extremity excoriation and erythema from scratching and she is some scratch marks and dry skin Neurologic: CN 2-12 grossly intact with no focal deficits but she is extremely demented Psychiatric: Impaired judgment and insight.  She is awake but not fully alert and oriented  Data Reviewed: I have personally reviewed following labs and imaging studies  CBC: Recent Labs  Lab 04/21/22 0529 04/22/22 0254 04/23/22 0356 04/24/22 0352 04/25/22 0343  WBC 7.9 6.8 6.5 5.7 6.5  NEUTROABS 5.7  --   --  3.5 3.5  HGB 12.7 12.6 11.6* 11.2* 10.7*  HCT 38.3 37.5 34.1* 33.4* 32.0*  MCV 102.4* 100.3* 97.7 97.9 97.6  PLT 171 142* 159 150 505*   Basic Metabolic Panel: Recent Labs  Lab 04/21/22 0529 04/22/22 0254 04/23/22 0356 04/24/22 0352 04/25/22 0343  NA 139 140 139 138 140  K 3.4* 3.6 3.6 4.8 3.5  CL 108 107 107 112* 114*  CO2 '22 25 22 '$ 20* 22  GLUCOSE 121* 63* 115* 105* 106*  BUN '12 9 9 '$ 7* 7*  CREATININE 0.67 0.62 0.83 0.56 0.67  CALCIUM 8.4* 8.5* 8.1* 7.6* 7.8*  MG  --   --   --  1.8 1.7  PHOS  --   --   --  1.9* 1.9*   GFR: Estimated Creatinine Clearance: 46.2 mL/min (by C-G formula based on SCr of 0.67 mg/dL). Liver Function Tests: Recent Labs  Lab 04/21/22 0529 04/22/22 0254 04/23/22 0356 04/24/22 0352 04/25/22 0343  AST 318* 444* 461* 388* 318*  ALT 201* 212* 219* 207* 181*  ALKPHOS 599* 566* 593* 593* 570*  BILITOT 2.6* 3.1* 3.7* 4.2* 4.9*  PROT 5.8* 5.0* 4.8* 4.4* 4.7*  ALBUMIN 2.6* 2.2* 2.2* 2.2* 2.0*   No results for input(s): LIPASE, AMYLASE in the last 168 hours. Recent Labs  Lab 04/21/22 0529  AMMONIA 38*   Coagulation Profile: No results for input(s): INR, PROTIME in the  last 168 hours. Cardiac Enzymes: No results for input(s): CKTOTAL, CKMB, CKMBINDEX, TROPONINI in the last 168 hours. BNP (last 3 results) No results for input(s): PROBNP in the last 8760 hours. HbA1C: No results for input(s): HGBA1C in the last 72 hours. CBG: Recent Labs  Lab 04/25/22 0033 04/25/22 0339 04/25/22 0738 04/25/22 1204 04/25/22 1631  GLUCAP 102* 104* 105* 120* 177*   Lipid Profile: No results for input(s): CHOL, HDL, LDLCALC, TRIG, CHOLHDL, LDLDIRECT in the last 72 hours. Thyroid Function  Tests: No results for input(s): TSH, T4TOTAL, FREET4, T3FREE, THYROIDAB in the last 72 hours. Anemia Panel: No results for input(s): VITAMINB12, FOLATE, FERRITIN, TIBC, IRON, RETICCTPCT in the last 72 hours. Sepsis Labs: No results for input(s): PROCALCITON, LATICACIDVEN in the last 168 hours.  No results found for this or any previous visit (from the past 240 hour(s)).   Radiology Studies: DG ERCP  Result Date: 04/25/2022 CLINICAL DATA:  ERCP with stent placement EXAM: ERCP TECHNIQUE: Multiple spot images obtained with the fluoroscopic device and submitted for interpretation post-procedure. FLUOROSCOPY: Refer to separate report COMPARISON:  None Available. FINDINGS: A total of 2 fluoroscopic spot images taken during ERCP are submitted for review. Initial image demonstrates a scope overlying the upper abdomen with catheterization of the common bile duct. Contrast injection demonstrates what appears to be a dilated common bile duct. There is an abrupt cutoff of the distal CBD. Second image demonstrates a metal biliary stent within the common bile duct. IMPRESSION: ERCP images as described. Refer to procedure report for full details. These images were submitted for radiologic interpretation only. Please see the procedural report for the amount of contrast and the fluoroscopy time utilized. Electronically Signed   By: Albin Felling M.D.   On: 04/25/2022 14:48     Scheduled Meds:   enoxaparin (LOVENOX) injection  40 mg Subcutaneous Q24H   hydrocerin   Topical Daily   pantoprazole (PROTONIX) IV  40 mg Intravenous Q24H   Continuous Infusions:  dextrose 5 % and 0.9% NaCl 75 mL/hr at 04/25/22 1453   potassium PHOSPHATE IVPB (in mmol) 84 mL/hr at 04/25/22 1453    LOS: 4 days   Raiford Noble, DO [facility] Available via Epic secure chat 7am-7pm After these hours, please refer to coverage provider listed on [email or web address] 04/25/2022, 5:01 PM

## 2022-04-25 NOTE — Anesthesia Procedure Notes (Signed)
Procedure Name: Intubation Date/Time: 04/25/2022 1:18 PM Performed by: Deliah Boston, CRNA Pre-anesthesia Checklist: Patient identified, Emergency Drugs available, Suction available and Patient being monitored Patient Re-evaluated:Patient Re-evaluated prior to induction Oxygen Delivery Method: Circle system utilized Preoxygenation: Pre-oxygenation with 100% oxygen Induction Type: IV induction Ventilation: Mask ventilation without difficulty Laryngoscope Size: Mac and 3 Grade View: Grade I Tube type: Oral Tube size: 7.0 mm Number of attempts: 1 Airway Equipment and Method: Stylet Placement Confirmation: ETT inserted through vocal cords under direct vision, positive ETCO2 and breath sounds checked- equal and bilateral Secured at: 21 cm Tube secured with: Tape Dental Injury: Teeth and Oropharynx as per pre-operative assessment

## 2022-04-25 NOTE — TOC Progression Note (Signed)
Transition of Care [facility]) - Progression Note    Patient Details  Name: [name] MRN: [id number] Date of Birth: 03/18/1941  Transition [facility]) CM/SW Contact  Leeroy Cha, RN Phone Number: 04/25/2022, 8:22 AM  Clinical Narrative:    Plan is to send to snf for st rehab after hospitalization.  Fl2 faxed out to [place] and surround  areas.   Expected Discharge Plan: [place] Barriers to Discharge: Continued Medical Work up  Expected Discharge Plan and Services Expected Discharge Plan: [place]   Discharge Planning Services: CM Consult   Living arrangements for the past 2 months: Single Family Home                                       Social Determinants of Health (SDOH) Interventions    Readmission Risk Interventions     View : No data to display.

## 2022-04-25 NOTE — Transfer of Care (Signed)
Immediate Anesthesia Transfer of Care Note  Patient: [name]  Procedure(s) Performed: Procedure(s): ENDOSCOPIC RETROGRADE CHOLANGIOPANCREATOGRAPHY (ERCP) (N/A) SPHINCTEROTOMY BILIARY STENT PLACEMENT (N/A) BILIARY BRUSHING  Patient Location: PACU  Anesthesia Type:General  Level of Consciousness: Patient easily awoken, sedated, comfortable, cooperative, following commands, responds to stimulation.   Airway & Oxygen Therapy: Patient spontaneously breathing, ventilating well, oxygen via simple oxygen mask.  Post-op Assessment: Report given to PACU RN, vital signs reviewed and stable, moving all extremities.   Post vital signs: Reviewed and stable.  Complications: No apparent anesthesia complications Last Vitals:  Vitals Value Taken Time  BP    Temp    Pulse    Resp    SpO2      Last Pain:  Vitals:   04/25/22 1240  TempSrc:   PainSc: 0-No pain         Complications: No notable events documented.

## 2022-04-25 NOTE — Plan of Care (Signed)
  Problem: Education: Goal: Knowledge of General Education information will improve Description Including pain rating scale, medication(s)/side effects and non-pharmacologic comfort measures Outcome: Progressing   

## 2022-04-25 NOTE — Anesthesia Preprocedure Evaluation (Signed)
Anesthesia Evaluation  Patient identified by MRN, date of birth, ID band Patient confused  General Assessment Comment:Pancreatic cancer  Reviewed: Patient's Chart, lab work & pertinent test results, Unable to perform ROS - Chart review only  Airway Mallampati: II  TM Distance: >3 FB Neck ROM: Full    Dental no notable dental hx.    Pulmonary neg pulmonary ROS,    Pulmonary exam normal breath sounds clear to auscultation       Cardiovascular hypertension, Normal cardiovascular exam Rhythm:Regular Rate:Normal     Neuro/Psych Dementia negative neurological ROS     GI/Hepatic negative GI ROS, Neg liver ROS,   Endo/Other  negative endocrine ROS  Renal/GU negative Renal ROS  negative genitourinary   Musculoskeletal negative musculoskeletal ROS (+)   Abdominal   Peds negative pediatric ROS (+)  Hematology  (+) Blood dyscrasia, anemia ,   Anesthesia Other Findings   Reproductive/Obstetrics negative OB ROS                             Anesthesia Physical Anesthesia Plan  ASA: 4  Anesthesia Plan: General   Post-op Pain Management: Minimal or no pain anticipated   Induction: Intravenous  PONV Risk Score and Plan: 2 and Ondansetron and Treatment may vary due to age or medical condition  Airway Management Planned: Oral ETT  Additional Equipment:   Intra-op Plan:   Post-operative Plan: Extubation in OR  Informed Consent: I have reviewed the patients History and Physical, chart, labs and discussed the procedure including the risks, benefits and alternatives for the proposed anesthesia with the patient or authorized representative who has indicated his/her understanding and acceptance.     Dental advisory given  Plan Discussed with: CRNA and Surgeon  Anesthesia Plan Comments:         Anesthesia Quick Evaluation

## 2022-04-25 NOTE — Progress Notes (Signed)
Impending ERCP, consent from pt's spouse Mr. [name] obtained with 2 RNs.

## 2022-04-26 DIAGNOSIS — K8689 Other specified diseases of pancreas: Secondary | ICD-10-CM | POA: Diagnosis not present

## 2022-04-26 DIAGNOSIS — R7989 Other specified abnormal findings of blood chemistry: Secondary | ICD-10-CM | POA: Diagnosis not present

## 2022-04-26 DIAGNOSIS — K831 Obstruction of bile duct: Secondary | ICD-10-CM | POA: Diagnosis not present

## 2022-04-26 DIAGNOSIS — G9340 Encephalopathy, unspecified: Secondary | ICD-10-CM | POA: Diagnosis not present

## 2022-04-26 DIAGNOSIS — Z515 Encounter for palliative care: Secondary | ICD-10-CM | POA: Diagnosis not present

## 2022-04-26 DIAGNOSIS — F0390 Unspecified dementia without behavioral disturbance: Secondary | ICD-10-CM | POA: Diagnosis not present

## 2022-04-26 LAB — MAGNESIUM: Magnesium: 2.1 mg/dL (ref 1.7–2.4)

## 2022-04-26 LAB — CBC WITH DIFFERENTIAL/PLATELET
Abs Immature Granulocytes: 0.04 10*3/uL (ref 0.00–0.07)
Basophils Absolute: 0 10*3/uL (ref 0.0–0.1)
Basophils Relative: 0 %
Eosinophils Absolute: 0 10*3/uL (ref 0.0–0.5)
Eosinophils Relative: 0 %
HCT: 34.8 % — ABNORMAL LOW (ref 36.0–46.0)
Hemoglobin: 11.8 g/dL — ABNORMAL LOW (ref 12.0–15.0)
Immature Granulocytes: 1 %
Lymphocytes Relative: 16 %
Lymphs Abs: 1.2 10*3/uL (ref 0.7–4.0)
MCH: 33.1 pg (ref 26.0–34.0)
MCHC: 33.9 g/dL (ref 30.0–36.0)
MCV: 97.5 fL (ref 80.0–100.0)
Monocytes Absolute: 0.2 10*3/uL (ref 0.1–1.0)
Monocytes Relative: 3 %
Neutro Abs: 6 10*3/uL (ref 1.7–7.7)
Neutrophils Relative %: 80 %
Platelets: 145 10*3/uL — ABNORMAL LOW (ref 150–400)
RBC: 3.57 MIL/uL — ABNORMAL LOW (ref 3.87–5.11)
RDW: 15.2 % (ref 11.5–15.5)
WBC: 7.5 10*3/uL (ref 4.0–10.5)
nRBC: 0 % (ref 0.0–0.2)

## 2022-04-26 LAB — COMPREHENSIVE METABOLIC PANEL
ALT: 157 U/L — ABNORMAL HIGH (ref 0–44)
AST: 182 U/L — ABNORMAL HIGH (ref 15–41)
Albumin: 2.1 g/dL — ABNORMAL LOW (ref 3.5–5.0)
Alkaline Phosphatase: 567 U/L — ABNORMAL HIGH (ref 38–126)
Anion gap: 5 (ref 5–15)
BUN: 8 mg/dL (ref 8–23)
CO2: 22 mmol/L (ref 22–32)
Calcium: 7.8 mg/dL — ABNORMAL LOW (ref 8.9–10.3)
Chloride: 111 mmol/L (ref 98–111)
Creatinine, Ser: 0.73 mg/dL (ref 0.44–1.00)
GFR, Estimated: >60 mL/min (ref >60)
Glucose, Bld: 170 mg/dL — ABNORMAL HIGH (ref 70–99)
Potassium: 3.4 mmol/L — ABNORMAL LOW (ref 3.5–5.1)
Sodium: 138 mmol/L (ref 135–145)
Total Bilirubin: 2.4 mg/dL — ABNORMAL HIGH (ref 0.3–1.2)
Total Protein: 5 g/dL — ABNORMAL LOW (ref 6.5–8.1)

## 2022-04-26 LAB — GLUCOSE, CAPILLARY
Glucose-Capillary: 111 mg/dL — ABNORMAL HIGH (ref 70–99)
Glucose-Capillary: 112 mg/dL — ABNORMAL HIGH (ref 70–99)
Glucose-Capillary: 116 mg/dL — ABNORMAL HIGH (ref 70–99)
Glucose-Capillary: 136 mg/dL — ABNORMAL HIGH (ref 70–99)
Glucose-Capillary: 150 mg/dL — ABNORMAL HIGH (ref 70–99)
Glucose-Capillary: 171 mg/dL — ABNORMAL HIGH (ref 70–99)

## 2022-04-26 LAB — PHOSPHORUS: Phosphorus: 2.5 mg/dL (ref 2.5–4.6)

## 2022-04-26 MED ORDER — POTASSIUM CHLORIDE CRYS ER 20 MEQ PO TBCR
40.0000 meq | EXTENDED_RELEASE_TABLET | Freq: Two times a day (BID) | ORAL | Status: AC
  Administered 2022-04-27 (×2): 40 meq via ORAL
  Filled 2022-04-26 (×2): qty 2

## 2022-04-26 NOTE — Progress Notes (Signed)
OT Cancellation Note  Patient Details Name: [name] MRN: [id number] DOB: [birth year]   Cancelled Treatment:    Reason Eval/Treat Not Completed: Patient declined, no reason specified. Patient declined therapy stating she wanted to sleep. Is unable to comprehend need for therapy.   Smriti Barkow L Tripton Ned 04/26/2022, 2:55 PM

## 2022-04-26 NOTE — Progress Notes (Signed)
PROGRESS NOTE    [name]  WNU:[id number] DOB: 15-Nov-1941 DOA: 04/21/2022 PCP: [name], MD   Brief Narrative:  The patient is an 81 year old female with a history of hypertension, hyperlipidemia, dementia who lives at home with her husband, brought to the hospital with worsening mental status.  She initially presented to [facility] where she was noted to have elevated LFTs.  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  Due to lack of GI coverage, she was transferred to [facility] for further work-up.  GI following now and took the patient for EGD and Biopsies were taken and EGD also showed gastritis.  GI speaking with the patient's family and consideration for an ERCP and family and they are agreeable so she underwent ERCP 04/25/22 by Dr. Clarene Essex.    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 for EGD -CT abdomen done at [facility] showed dilated biliary and pancreatic ducts with possible pancreatic head mass versus ampullary mass -GI following and plan is for EGD today -Unable to do MRI since patient has spinal stimulator in place -Continue to Hold statin -AST went from 444 -> 461 -> 388 -> 318 and is now 182 and ALT went from 212 -> 219 -> 207 -> 181 and is now 157 -T Bili went from 3.1 -> 3.7 -> 4.2 -> 4.9 -> 2.4 and now finally improving  -CEA was 3.1, AFP serum tumor marker was 2.7 but CA 19-9 was significantly elevated at 130 -Further Care per GI: She underwent an EGD which showed  bulging at the area of the ampulla from the pancreatic mass pushing towards the ampulla of the duodenum with no discrete mass seen.  Biopsies were performed and EGD also showed gastritis so she is placed on IV pantoprazole.  GI recommending following the biopsies and discussed the case further and they feel the EUS is not indicated at this time and the family is not interested in surgical options but they are recommending considering ERCP for further evaluation. -The GI team is discussing ERCP with the patient and patient's family and they are going to discuss with rest of family and talk over but she has a spot held for an ERCP tomorrow if they are agreeable; patient underwent ERCP today and showed that the major papilla was slightly bulging and deep selective cannulation was will be obtained first attempt. GI did a biliary sphincterotomy that was performed and cells for cytology were obtained in the lower third of the main duct and a covered metal stent was placed into the common bile duct. -GI recommending a clear liquid diet for 6 hours and then advance diet as tolerated slowly; now she is tolerating a soft diet -GI recommending awaiting cytology results and following up with the GI clinic as needed -Consulted Palliative Care for further GOC Discussion  -PT OT recommending SNF   Dementia without behavioral disturbance with superimposed Acute Encephalopathy -Dr. Roderic Palau Discussed with patient's son and he describes that patient has fairly advanced dementia -He reports that she has been frequently sundowning at home in the last several months -She is also had  worsening confusion during the day -He has noted progressive decline -Ammonia Level was 38 and TSH was 3.144 -Analysis was done and showed clear appearance with amber-colored urine, small hemoglobin, 20 ketones, large leukocytes, negative nitrites, pH 6.0, rare bacteria, 0-5 RBCs per high-power field, 0-5 squamous epithelial cells, 21-50  WBCs -Delirium Precautions    Hypoglycemia -No longer n.p.o. and so we will stop IV fluid hydration -She was Started on dextrose infusion and now CBG's ranging from 107-123; currently getting D5 half-normal saline at 75 MLS per hour and now that she is tolerating Soft Diet will stop IVF    Hypertension -Chronically on atenolol -Holding for now since she has had some episodes of bradycardia and is also currently normotensive -Continue to monitor blood pressures per protocol; last blood pressure reading was now slightly elevated at 122/73   Hyperlipidemia -Holding statin in light of elevated LFTs   Normocytic Anemia -Hgb/Hct went from 12.7/38.3 -> 12.6/37.5 -> 11.6/34.1 -> 11.2/33.4 -> 10.7/32.0 -> 11.8/34.8 -Check Anemia Panel in the AM -Continue to Monitor for S/Sx of Bleeding; No overt bleeding noted -Repeat CBC in the AM   Hypokalemia -Patient's Phos Level is now gone from 4.8 -> 3.5 -> 3.4 -Replete with po Kcl 40 mEQ BID x2 -Continue to Monitor and Replete as Necessary -Repeat CMP in the AM    Hypophosphatemia -His phosphorus level is now 2.5 -Continue to Monitor and Replete as Necessary -Repeat Phos Level in the AM    Metabolic Acidosis -Patient CO2 is now 22, anion gap is 5, chloride level 111 -Getting IV fluid hydration with D5 normal saline at 75 MLS per hour -Continue to monitor and trend and repeat CMP in a.m.   Goals of Care -Dr. Roderic Palau tried to initiate goals of care conversation with patient's son on 5/29.  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 -We will likely need to reengage with palliative care to help address goals of care once further information regarding her biliary  process becomes available and Palliative Care has been consulted and will await decision for ERCP if family is agreeable and if they consent ERCP    Hypoalbuminemia -Patient's albumin level is now 2.1 -Continue monitor and trend and repeat CMP in a.m.   Thrombocytopenia -Patient's platelet count went from 159 and trended down to 127 -> 145 -Continue to monitor for signs and symptoms of bleeding; no overt bleeding noted -Repeat CBC in the AM   Bilateral lower extremity wounds -WOC nurse consulted and recommending apply Eucerin cream bilaterally after bathing and roughly towel drying to assist with removal of loose dry skin  DVT prophylaxis: enoxaparin (LOVENOX) injection 40 mg Start: 04/21/22 2200    Code Status: DNR Family Communication: No family present at bedside   Disposition Plan:  Level of care: Med-Surg Status is: Inpatient Remains inpatient appropriate because: Needs SNF and continued Palliative Care discussions. GI has signed off the case now   Consultants:  Gastroenterology Palliative Care Medicine  Procedures:  EGD Findings:      The Z-line was regular and was found 37 cm from the incisors.      Diffuse moderate inflammation characterized by congestion (edema),       erosions and erythema was found in the entire examined stomach. Biopsies  were taken with a cold forceps for histology.      The cardia and gastric fundus were normal on retroflexion.      Area of ampulla appeared bulging likely from pancreatic mass pushing       towards Ampulla and duodenum. This area was evaluated with side-viewing       scope. Biopsies performed. Impression:               - Z-line regular, 37 cm from the incisors.                           - Chronic gastritis. Biopsied. Moderate Sedation:      Moderate (conscious) sedation was personally administered by an       anesthesia professional. The following parameters were monitored: oxygen       saturation, heart rate, blood  pressure, and response to care. Recommendation:           - Return patient to hospital ward for ongoing care.                           - Resume previous diet.                           - Continue present medications.   ERCP Findings:      The major papilla was slightly bulging. Deep selective cannulation was       readily obtained on the first attempt and an obvious distal stricture       was seen in the CBD was dilated above the stricture and we proceeded       with a biliary sphincterotomy was made with a Hydratome sphincterotome       using ERBE electrocautery. There was no post-sphincterotomy bleeding. We       proceeded until we had adequate biliary drainage and could easily get       the sphincterotome in and out of the duct and cells for cytology were       obtained by brushing in the lower third of the main bile duct mostly of       the stricture in the customary fashion. One 10 Fr by 6 cm covered metal       stent with no external flaps and no internal flaps was placed 5 cm into       the common bile duct. Bile flowed through the stent. The stent was in       good position. The wire and introducer were removed and the scope was       removed and there was no pancreatic duct injection or wire advancement       throughout the procedure and the patient tolerated the procedure well       and there was no obvious immediate complication Impression:               - The major papilla appeared to be slightly bulging.                           - A biliary sphincterotomy was performed.                           - Cells for cytology obtained in the lower third of  the main duct.                           - One covered metal stent was placed into the                            common bile duct. Moderate Sedation:      Not Applicable - Patient had care per Anesthesia. Recommendation:           - Clear liquid diet for 6 hours. If doing well this                             evening may slowly advance to soft diet and slowly                            advance tomorrow                           - Continue present medications.                           - Check liver enzymes (AST, ALT, alkaline                            phosphatase, bilirubin) tomorrow. And follow back                            to normal as an outpatient and her primary doctor                            could do this                           - Await cytology results. Consider oncology consult                            for possible chemo or radiation                           - Return to GI clinic PRN.                           - Telephone GI clinic for pathology results in 1                            week.                           - Telephone GI clinic if symptomatic PRN.  Antimicrobials:  Anti-infectives (From admission, onward)    Start     Dose/Rate Route Frequency Ordered Stop   04/25/22 1230  piperacillin-tazobactam (ZOSYN) IVPB 3.375 g        3.375 g 100 mL/hr over 30 Minutes Intravenous On call to O.R. 04/24/22 1852 04/25/22 1338       Subjective: Seen and examined at bedside and thinks that she is doing a little bit  better.  Still very pleasantly confused and demented.  States that she is not doing quite so but then contradicts herself and states that she thinks she is feeling better and denies any abdominal pain and states that her pain is improved.  Feels okay.  Denies any chest pain or shortness of breath.  No other concerns or complaints at this time.  Objective: Vitals:   04/25/22 2003 04/26/22 0201 04/26/22 0410 04/26/22 1243  BP: (!) 180/95 (!) 118/100 137/65 122/73  Pulse: 68 61 (!) 54 (!) 51  Resp: '14 15 16 18  '$ Temp: (!) 97.5 F (36.4 C)  (!) 97.3 F (36.3 C) 97.6 F (36.4 C)  TempSrc: Oral  Oral Oral  SpO2: 95% 97% 96% 95%  Weight:      Height:        Intake/Output Summary (Last 24 hours) at 04/26/2022 1554 Last data filed at 04/26/2022 1244 Gross per  24 hour  Intake 1652.45 ml  Output 2300 ml  Net -647.55 ml   Filed Weights   04/23/22 1150 04/25/22 1240  Weight: 62 kg 62 kg   Examination: Physical Exam:  Constitutional: Thin elderly pleasantly demented Caucasian female currently in NAD Respiratory: Diminished to auscultation bilaterally, no wheezing, rales, rhonchi or crackles. Normal respiratory effort and patient is not tachypenic. No accessory muscle use.  Unlabored breathing Cardiovascular: RRR, no murmurs / rubs / gallops. S1 and S2 auscultated.   Abdomen: Soft, slightly-tender, non-distended. Bowel sounds positive.  GU: Deferred. Musculoskeletal: No clubbing / cyanosis of digits/nails. No joint deformity upper and lower extremities.  Skin: Has bilateral lower extremity excoriation and erythema from scratching and has some scratch marks and dry skin Neurologic: CN 2-12 grossly intact with no focal deficits but she is hard of hearing. Romberg sign cerebellar reflexes not assessed.  Psychiatric: Impaired judgment and insight. Pleasantly demented   Data Reviewed: I have personally reviewed following labs and imaging studies  CBC: Recent Labs  Lab 04/21/22 0529 04/22/22 0254 04/23/22 0356 04/24/22 0352 04/25/22 0343 04/26/22 0415  WBC 7.9 6.8 6.5 5.7 6.5 7.5  NEUTROABS 5.7  --   --  3.5 3.5 6.0  HGB 12.7 12.6 11.6* 11.2* 10.7* 11.8*  HCT 38.3 37.5 34.1* 33.4* 32.0* 34.8*  MCV 102.4* 100.3* 97.7 97.9 97.6 97.5  PLT 171 142* 159 150 127* 419*   Basic Metabolic Panel: Recent Labs  Lab 04/22/22 0254 04/23/22 0356 04/24/22 0352 04/25/22 0343 04/26/22 0415  NA 140 139 138 140 138  K 3.6 3.6 4.8 3.5 3.4*  CL 107 107 112* 114* 111  CO2 25 22 20* 22 22  GLUCOSE 63* 115* 105* 106* 170*  BUN 9 9 7* 7* 8  CREATININE 0.62 0.83 0.56 0.67 0.73  CALCIUM 8.5* 8.1* 7.6* 7.8* 7.8*  MG  --   --  1.8 1.7 2.1  PHOS  --   --  1.9* 1.9* 2.5   GFR: Estimated Creatinine Clearance: 46.2 mL/min (by C-G formula based on SCr of  0.73 mg/dL). Liver Function Tests: Recent Labs  Lab 04/22/22 0254 04/23/22 0356 04/24/22 0352 04/25/22 0343 04/26/22 0415  AST 444* 461* 388* 318* 182*  ALT 212* 219* 207* 181* 157*  ALKPHOS 566* 593* 593* 570* 567*  BILITOT 3.1* 3.7* 4.2* 4.9* 2.4*  PROT 5.0* 4.8* 4.4* 4.7* 5.0*  ALBUMIN 2.2* 2.2* 2.2* 2.0* 2.1*   No results for input(s): LIPASE, AMYLASE in the last 168 hours. Recent Labs  Lab 04/21/22 0529  AMMONIA 38*   Coagulation Profile:  No results for input(s): INR, PROTIME in the last 168 hours. Cardiac Enzymes: No results for input(s): CKTOTAL, CKMB, CKMBINDEX, TROPONINI in the last 168 hours. BNP (last 3 results) No results for input(s): PROBNP in the last 8760 hours. HbA1C: No results for input(s): HGBA1C in the last 72 hours. CBG: Recent Labs  Lab 04/25/22 1956 04/25/22 2320 04/26/22 0405 04/26/22 0721 04/26/22 1133  GLUCAP 226* 233* 171* 150* 136*   Lipid Profile: No results for input(s): CHOL, HDL, LDLCALC, TRIG, CHOLHDL, LDLDIRECT in the last 72 hours. Thyroid Function Tests: No results for input(s): TSH, T4TOTAL, FREET4, T3FREE, THYROIDAB in the last 72 hours. Anemia Panel: No results for input(s): VITAMINB12, FOLATE, FERRITIN, TIBC, IRON, RETICCTPCT in the last 72 hours. Sepsis Labs: No results for input(s): PROCALCITON, LATICACIDVEN in the last 168 hours.  No results found for this or any previous visit (from the past 240 hour(s)).   Radiology Studies: DG ERCP  Result Date: 04/25/2022 CLINICAL DATA:  ERCP with stent placement EXAM: ERCP TECHNIQUE: Multiple spot images obtained with the fluoroscopic device and submitted for interpretation post-procedure. FLUOROSCOPY: Refer to separate report COMPARISON:  None Available. FINDINGS: A total of 2 fluoroscopic spot images taken during ERCP are submitted for review. Initial image demonstrates a scope overlying the upper abdomen with catheterization of the common bile duct. Contrast injection  demonstrates what appears to be a dilated common bile duct. There is an abrupt cutoff of the distal CBD. Second image demonstrates a metal biliary stent within the common bile duct. IMPRESSION: ERCP images as described. Refer to procedure report for full details. These images were submitted for radiologic interpretation only. Please see the procedural report for the amount of contrast and the fluoroscopy time utilized. Electronically Signed   By: Albin Felling M.D.   On: 04/25/2022 14:48     Scheduled Meds:  enoxaparin (LOVENOX) injection  40 mg Subcutaneous Q24H   hydrocerin   Topical Daily   pantoprazole (PROTONIX) IV  40 mg Intravenous Q24H   Continuous Infusions:  dextrose 5 % and 0.9% NaCl 75 mL/hr at 04/26/22 1531    LOS: 5 days   Raiford Noble, DO [facility] Available via Epic secure chat 7am-7pm After these hours, please refer to coverage provider listed on [email or web address] 04/26/2022, 3:54 PM

## 2022-04-26 NOTE — Progress Notes (Signed)
Subjective: Patient able to tolerate soft diet without issues as per her nurse. She denies abdominal pain.  Objective: Vital signs in last 24 hours: Temp:  [97.1 F (36.2 C)-97.6 F (36.4 C)] 97.6 F (36.4 C) (06/03 1243) Pulse Rate:  [51-78] 51 (06/03 1243) Resp:  [11-22] 18 (06/03 1243) BP: (118-184)/(65-108) 122/73 (06/03 1243) SpO2:  [94 %-100 %] 95 % (06/03 1243) Weight change:  Last BM Date :  (unknown)  PE: Elderly, dementia with hard hearing GENERAL: Not in distress  ABDOMEN: Soft, nondistended, nontender EXTREMITIES: Chronic bilateral skin changes noted in lower legs  Lab Results: Results for orders placed or performed during the hospital encounter of 04/21/22 (from the past 48 hour(s))  Glucose, capillary     Status: Abnormal   Collection Time: 04/24/22  4:16 PM  Result Value Ref Range   Glucose-Capillary 115 (H) 70 - 99 mg/dL    Comment: Glucose reference range applies only to samples taken after fasting for at least 8 hours.  Glucose, capillary     Status: Abnormal   Collection Time: 04/24/22  7:30 PM  Result Value Ref Range   Glucose-Capillary 109 (H) 70 - 99 mg/dL    Comment: Glucose reference range applies only to samples taken after fasting for at least 8 hours.  Glucose, capillary     Status: Abnormal   Collection Time: 04/25/22 12:33 AM  Result Value Ref Range   Glucose-Capillary 102 (H) 70 - 99 mg/dL    Comment: Glucose reference range applies only to samples taken after fasting for at least 8 hours.  Glucose, capillary     Status: Abnormal   Collection Time: 04/25/22  3:39 AM  Result Value Ref Range   Glucose-Capillary 104 (H) 70 - 99 mg/dL    Comment: Glucose reference range applies only to samples taken after fasting for at least 8 hours.  CBC with Differential/Platelet     Status: Abnormal   Collection Time: 04/25/22  3:43 AM  Result Value Ref Range   WBC 6.5 4.0 - 10.5 K/uL   RBC 3.28 (L) 3.87 - 5.11 MIL/uL   Hemoglobin 10.7 (L) 12.0 - 15.0  g/dL   HCT 32.0 (L) 36.0 - 46.0 %   MCV 97.6 80.0 - 100.0 fL   MCH 32.6 26.0 - 34.0 pg   MCHC 33.4 30.0 - 36.0 g/dL   RDW 15.0 11.5 - 15.5 %   Platelets 127 (L) 150 - 400 K/uL   nRBC 0.0 0.0 - 0.2 %   Neutrophils Relative % 53 %   Neutro Abs 3.5 1.7 - 7.7 K/uL   Lymphocytes Relative 33 %   Lymphs Abs 2.2 0.7 - 4.0 K/uL   Monocytes Relative 8 %   Monocytes Absolute 0.5 0.1 - 1.0 K/uL   Eosinophils Relative 4 %   Eosinophils Absolute 0.3 0.0 - 0.5 K/uL   Basophils Relative 1 %   Basophils Absolute 0.0 0.0 - 0.1 K/uL   Immature Granulocytes 1 %   Abs Immature Granulocytes 0.04 0.00 - 0.07 K/uL    Comment: Performed at [facility], [place] [address], [place], [place] [address]  Comprehensive metabolic panel     Status: Abnormal   Collection Time: 04/25/22  3:43 AM  Result Value Ref Range   Sodium 140 135 - 145 mmol/L   Potassium 3.5 3.5 - 5.1 mmol/L    Comment: DELTA CHECK NOTED   Chloride 114 (H) 98 - 111 mmol/L   CO2 22 22 - 32 mmol/L  Glucose, Bld 106 (H) 70 - 99 mg/dL    Comment: Glucose reference range applies only to samples taken after fasting for at least 8 hours.   BUN 7 (L) 8 - 23 mg/dL   Creatinine, Ser 0.67 0.44 - 1.00 mg/dL   Calcium 7.8 (L) 8.9 - 10.3 mg/dL   Total Protein 4.7 (L) 6.5 - 8.1 g/dL   Albumin 2.0 (L) 3.5 - 5.0 g/dL   AST 318 (H) 15 - 41 U/L   ALT 181 (H) 0 - 44 U/L   Alkaline Phosphatase 570 (H) 38 - 126 U/L   Total Bilirubin 4.9 (H) 0.3 - 1.2 mg/dL   GFR, Estimated >60 >60 mL/min    Comment: (NOTE) Calculated using the CKD-EPI Creatinine Equation (2021)    Anion gap 4 (L) 5 - 15    Comment: Performed at [facility], [place] [address] [place], [place] [address]  Phosphorus     Status: Abnormal   Collection Time: 04/25/22  3:43 AM  Result Value Ref Range   Phosphorus 1.9 (L) 2.5 - 4.6 mg/dL    Comment: Performed at [facility], [place] [address], [place], [place] [address]  Magnesium      Status: None   Collection Time: 04/25/22  3:43 AM  Result Value Ref Range   Magnesium 1.7 1.7 - 2.4 mg/dL    Comment: Performed at [facility], [place] [address], [place], [place] [address]  Glucose, capillary     Status: Abnormal   Collection Time: 04/25/22  7:38 AM  Result Value Ref Range   Glucose-Capillary 105 (H) 70 - 99 mg/dL    Comment: Glucose reference range applies only to samples taken after fasting for at least 8 hours.  Glucose, capillary     Status: Abnormal   Collection Time: 04/25/22 12:04 PM  Result Value Ref Range   Glucose-Capillary 120 (H) 70 - 99 mg/dL    Comment: Glucose reference range applies only to samples taken after fasting for at least 8 hours.  Glucose, capillary     Status: Abnormal   Collection Time: 04/25/22  4:31 PM  Result Value Ref Range   Glucose-Capillary 177 (H) 70 - 99 mg/dL    Comment: Glucose reference range applies only to samples taken after fasting for at least 8 hours.  Glucose, capillary     Status: Abnormal   Collection Time: 04/25/22  7:56 PM  Result Value Ref Range   Glucose-Capillary 226 (H) 70 - 99 mg/dL    Comment: Glucose reference range applies only to samples taken after fasting for at least 8 hours.  Glucose, capillary     Status: Abnormal   Collection Time: 04/25/22 11:20 PM  Result Value Ref Range   Glucose-Capillary 233 (H) 70 - 99 mg/dL    Comment: Glucose reference range applies only to samples taken after fasting for at least 8 hours.  Glucose, capillary     Status: Abnormal   Collection Time: 04/26/22  4:05 AM  Result Value Ref Range   Glucose-Capillary 171 (H) 70 - 99 mg/dL    Comment: Glucose reference range applies only to samples taken after fasting for at least 8 hours.  CBC with Differential/Platelet     Status: Abnormal   Collection Time: 04/26/22  4:15 AM  Result Value Ref Range   WBC 7.5 4.0 - 10.5 K/uL   RBC 3.57 (L) 3.87 - 5.11 MIL/uL   Hemoglobin 11.8 (L) 12.0 - 15.0 g/dL  HCT 34.8  (L) 36.0 - 46.0 %   MCV 97.5 80.0 - 100.0 fL   MCH 33.1 26.0 - 34.0 pg   MCHC 33.9 30.0 - 36.0 g/dL   RDW 15.2 11.5 - 15.5 %   Platelets 145 (L) 150 - 400 K/uL   nRBC 0.0 0.0 - 0.2 %   Neutrophils Relative % 80 %   Neutro Abs 6.0 1.7 - 7.7 K/uL   Lymphocytes Relative 16 %   Lymphs Abs 1.2 0.7 - 4.0 K/uL   Monocytes Relative 3 %   Monocytes Absolute 0.2 0.1 - 1.0 K/uL   Eosinophils Relative 0 %   Eosinophils Absolute 0.0 0.0 - 0.5 K/uL   Basophils Relative 0 %   Basophils Absolute 0.0 0.0 - 0.1 K/uL   Immature Granulocytes 1 %   Abs Immature Granulocytes 0.04 0.00 - 0.07 K/uL    Comment: Performed at [facility], [place] [address], [place], [place] [address]  Comprehensive metabolic panel     Status: Abnormal   Collection Time: 04/26/22  4:15 AM  Result Value Ref Range   Sodium 138 135 - 145 mmol/L   Potassium 3.4 (L) 3.5 - 5.1 mmol/L   Chloride 111 98 - 111 mmol/L   CO2 22 22 - 32 mmol/L   Glucose, Bld 170 (H) 70 - 99 mg/dL    Comment: Glucose reference range applies only to samples taken after fasting for at least 8 hours.   BUN 8 8 - 23 mg/dL   Creatinine, Ser 0.73 0.44 - 1.00 mg/dL   Calcium 7.8 (L) 8.9 - 10.3 mg/dL   Total Protein 5.0 (L) 6.5 - 8.1 g/dL   Albumin 2.1 (L) 3.5 - 5.0 g/dL   AST 182 (H) 15 - 41 U/L   ALT 157 (H) 0 - 44 U/L   Alkaline Phosphatase 567 (H) 38 - 126 U/L   Total Bilirubin 2.4 (H) 0.3 - 1.2 mg/dL   GFR, Estimated >60 >60 mL/min    Comment: (NOTE) Calculated using the CKD-EPI Creatinine Equation (2021)    Anion gap 5 5 - 15    Comment: Performed at [facility], [place] [address] [place], [place] [address]  Phosphorus     Status: None   Collection Time: 04/26/22  4:15 AM  Result Value Ref Range   Phosphorus 2.5 2.5 - 4.6 mg/dL    Comment: Performed at [facility], [place] [address] [place], [place] [address]  Magnesium     Status: None   Collection Time: 04/26/22  4:15 AM  Result Value  Ref Range   Magnesium 2.1 1.7 - 2.4 mg/dL    Comment: Performed at [facility], [place] [address], [place], [place] [address]  Glucose, capillary     Status: Abnormal   Collection Time: 04/26/22  7:21 AM  Result Value Ref Range   Glucose-Capillary 150 (H) 70 - 99 mg/dL    Comment: Glucose reference range applies only to samples taken after fasting for at least 8 hours.  Glucose, capillary     Status: Abnormal   Collection Time: 04/26/22 11:33 AM  Result Value Ref Range   Glucose-Capillary 136 (H) 70 - 99 mg/dL    Comment: Glucose reference range applies only to samples taken after fasting for at least 8 hours.    Studies/Results: DG ERCP  Result Date: 04/25/2022 CLINICAL DATA:  ERCP with stent placement EXAM: ERCP TECHNIQUE: Multiple spot images obtained with the fluoroscopic device and  submitted for interpretation post-procedure. FLUOROSCOPY: Refer to separate report COMPARISON:  None Available. FINDINGS: A total of 2 fluoroscopic spot images taken during ERCP are submitted for review. Initial image demonstrates a scope overlying the upper abdomen with catheterization of the common bile duct. Contrast injection demonstrates what appears to be a dilated common bile duct. There is an abrupt cutoff of the distal CBD. Second image demonstrates a metal biliary stent within the common bile duct. IMPRESSION: ERCP images as described. Refer to procedure report for full details. These images were submitted for radiologic interpretation only. Please see the procedural report for the amount of contrast and the fluoroscopy time utilized. Electronically Signed   By: Albin Felling M.D.   On: 04/25/2022 14:48    Medications: I have reviewed the patient's current medications.  Assessment: Status post ERCP with metal stent placed yesterday LFTs trending down, T. 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 Patient being considered for palliative care for goals of discussion. GI will sign off.  Ronnette Juniper, MD 04/26/2022, 2:00 PM

## 2022-04-27 DIAGNOSIS — F0390 Unspecified dementia without behavioral disturbance: Secondary | ICD-10-CM | POA: Diagnosis not present

## 2022-04-27 DIAGNOSIS — G9340 Encephalopathy, unspecified: Secondary | ICD-10-CM | POA: Diagnosis not present

## 2022-04-27 DIAGNOSIS — K831 Obstruction of bile duct: Secondary | ICD-10-CM | POA: Diagnosis not present

## 2022-04-27 DIAGNOSIS — R7989 Other specified abnormal findings of blood chemistry: Secondary | ICD-10-CM | POA: Diagnosis not present

## 2022-04-27 LAB — CBC WITH DIFFERENTIAL/PLATELET
Abs Immature Granulocytes: 0.04 10*3/uL (ref 0.00–0.07)
Basophils Absolute: 0 10*3/uL (ref 0.0–0.1)
Basophils Relative: 0 %
Eosinophils Absolute: 0.2 10*3/uL (ref 0.0–0.5)
Eosinophils Relative: 2 %
HCT: 34.5 % — ABNORMAL LOW (ref 36.0–46.0)
Hemoglobin: 11.3 g/dL — ABNORMAL LOW (ref 12.0–15.0)
Immature Granulocytes: 0 %
Lymphocytes Relative: 19 %
Lymphs Abs: 2 10*3/uL (ref 0.7–4.0)
MCH: 33 pg (ref 26.0–34.0)
MCHC: 32.8 g/dL (ref 30.0–36.0)
MCV: 100.9 fL — ABNORMAL HIGH (ref 80.0–100.0)
Monocytes Absolute: 0.7 10*3/uL (ref 0.1–1.0)
Monocytes Relative: 7 %
Neutro Abs: 7.6 10*3/uL (ref 1.7–7.7)
Neutrophils Relative %: 72 %
Platelets: 125 10*3/uL — ABNORMAL LOW (ref 150–400)
RBC: 3.42 MIL/uL — ABNORMAL LOW (ref 3.87–5.11)
RDW: 15.5 % (ref 11.5–15.5)
WBC: 10.5 10*3/uL (ref 4.0–10.5)
nRBC: 0 % (ref 0.0–0.2)

## 2022-04-27 LAB — COMPREHENSIVE METABOLIC PANEL
ALT: 116 U/L — ABNORMAL HIGH (ref 0–44)
AST: 85 U/L — ABNORMAL HIGH (ref 15–41)
Albumin: 2.1 g/dL — ABNORMAL LOW (ref 3.5–5.0)
Alkaline Phosphatase: 472 U/L — ABNORMAL HIGH (ref 38–126)
Anion gap: 6 (ref 5–15)
BUN: 10 mg/dL (ref 8–23)
CO2: 20 mmol/L — ABNORMAL LOW (ref 22–32)
Calcium: 7.8 mg/dL — ABNORMAL LOW (ref 8.9–10.3)
Chloride: 113 mmol/L — ABNORMAL HIGH (ref 98–111)
Creatinine, Ser: 0.73 mg/dL (ref 0.44–1.00)
GFR, Estimated: >60 mL/min (ref >60)
Glucose, Bld: 119 mg/dL — ABNORMAL HIGH (ref 70–99)
Potassium: 3.5 mmol/L (ref 3.5–5.1)
Sodium: 139 mmol/L (ref 135–145)
Total Bilirubin: 2.1 mg/dL — ABNORMAL HIGH (ref 0.3–1.2)
Total Protein: 4.8 g/dL — ABNORMAL LOW (ref 6.5–8.1)

## 2022-04-27 LAB — PHOSPHORUS: Phosphorus: 1.9 mg/dL — ABNORMAL LOW (ref 2.5–4.6)

## 2022-04-27 LAB — GLUCOSE, CAPILLARY
Glucose-Capillary: 101 mg/dL — ABNORMAL HIGH (ref 70–99)
Glucose-Capillary: 87 mg/dL (ref 70–99)
Glucose-Capillary: 103 mg/dL — ABNORMAL HIGH (ref 70–99)
Glucose-Capillary: 96 mg/dL (ref 70–99)
Glucose-Capillary: 118 mg/dL — ABNORMAL HIGH (ref 70–99)

## 2022-04-27 LAB — MAGNESIUM: Magnesium: 2.1 mg/dL (ref 1.7–2.4)

## 2022-04-27 MED ORDER — K PHOS MONO-SOD PHOS DI & MONO 155-852-130 MG PO TABS
500.0000 mg | ORAL_TABLET | Freq: Two times a day (BID) | ORAL | Status: AC
  Administered 2022-04-27: 500 mg via ORAL
  Filled 2022-04-27 (×2): qty 2

## 2022-04-27 MED ORDER — PANTOPRAZOLE SODIUM 40 MG PO TBEC
40.0000 mg | DELAYED_RELEASE_TABLET | Freq: Every day | ORAL | Status: DC
  Administered 2022-04-28 – 2022-04-29 (×2): 40 mg via ORAL
  Filled 2022-04-27 (×2): qty 1

## 2022-04-27 NOTE — Progress Notes (Signed)
PROGRESS NOTE    [name]  JTT:[id number] DOB: 03-21-1941 DOA: 04/21/2022 PCP: [name], MD   Brief Narrative:  The patient is an 81 year old female with a history of hypertension, hyperlipidemia, dementia who lives at home with her husband, brought to the hospital with worsening mental status.  She initially presented to [facility] where she was noted to have elevated LFTs.  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  Due to lack of GI coverage, she was transferred to [facility] for further work-up.  GI following now and took the patient for EGD and Biopsies were taken and EGD also showed gastritis.  GI speaking with the patient's family and consideration for an ERCP and family and they are agreeable so she underwent ERCP 04/25/22 by Dr. Clarene Essex.     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 for EGD -CT abdomen done at [facility] showed dilated biliary and pancreatic ducts with possible pancreatic head mass versus ampullary mass -GI following and plan is for EGD today -Unable to do MRI since patient has spinal stimulator in place -Continue to Hold statin -AST went from 444 -> 461 -> 388 -> 318 -> 182 -> 85 and ALT went from 212 -> 219 -> 207 -> 181 -> 157 and is now 116 -T Bili went from 3.1 -> 3.7 -> 4.2 -> 4.9 -> 2.4 -> 2.1 and now finally improving  -CEA was 3.1, AFP serum tumor marker was 2.7 but CA 19-9 was significantly elevated at 130 -Further Care per GI: She underwent an EGD which  showed bulging at the area of the ampulla from the pancreatic mass pushing towards the ampulla of the duodenum with no discrete mass seen.  Biopsies were performed and EGD also showed gastritis so she is placed on IV pantoprazole.  GI recommending following the biopsies and discussed the case further and they feel the EUS is not indicated at this time and the family is not interested in surgical options but they are recommending considering ERCP for further evaluation. -The GI team is discussing ERCP with the patient and patient's family and they are going to discuss with rest of family and talk over but she has a spot held for an ERCP tomorrow if they are agreeable; patient underwent ERCP today and showed that the major papilla was slightly bulging and deep selective cannulation was will be obtained first attempt. GI did a biliary sphincterotomy that was performed and cells for cytology were obtained in the lower third of the main duct and a covered metal stent was placed into the common bile duct. -GI recommending a clear liquid diet for 6 hours and then advance diet as tolerated slowly; now she is tolerating a soft diet -GI recommending awaiting cytology results and following up with the GI clinic as needed -Consulted Palliative Care for further GOC Discussion; see below -PT OT recommending SNF   Dementia without behavioral disturbance with superimposed Acute Encephalopathy -Dr. Roderic Palau Discussed with patient's son and he describes that patient has fairly advanced dementia -He reports that she has been frequently sundowning at home in the last  several months -She is also had worsening confusion during the day -He has noted progressive decline -Ammonia Level was 38 and TSH was 3.144 -Analysis was done and showed clear appearance with amber-colored urine, small hemoglobin, 20 ketones, large leukocytes, negative nitrites, pH 6.0, rare bacteria, 0-5 RBCs per high-power field, 0-5 squamous epithelial cells,  21-50 WBCs -Delirium Precautions as she remains can use   Hypoglycemia -No longer n.p.o. and so we will stop IV fluid hydration -She was Started on dextrose infusion and now CBG's ranging from 107-123; currently getting D5 half-normal saline at 75 MLS per hour and now that she is tolerating Soft Diet will stop IVF    Hypertension -Chronically on atenolol -Holding for now since she has had some episodes of bradycardia and is also currently normotensive -Continue to monitor blood pressures per protocol; last blood pressure reading was now slightly elevated at 160/74 Hyperlipidemia -Holding statin in light of elevated LFTs   Normocytic Anemia -Hgb/Hct went from 12.7/38.3 -> 12.6/37.5 -> 11.6/34.1 -> 11.2/33.4 -> 10.7/32.0 -> 11.8/34.8 and is now 11.3/34.5 -Check Anemia Panel in the AM -Continue to Monitor for S/Sx of Bleeding; No overt bleeding noted -Repeat CBC in the AM    Hypokalemia -Patient's Phos Level is now gone from 4.8 -> 3.5 -> 3.4 and today was 3.5 -Replete with po K-Phos Neutral 500 g p.o. twice daily x2 doses and received 40 mill colons of p.o. KCl twice daily x2 doses yesterday -Continue to Monitor and Replete as Necessary -Repeat CMP in the AM    Hypophosphatemia -His phosphorus level is now 1.9 -Pleat with p.o. K-Phos Neutral 500 mg p.o. twice daily x2 doses -Continue to Monitor and Replete as Necessary -Repeat Phos Level in the AM    Metabolic Acidosis -Patient CO2 is now 20, anion gap is 6, chloride level 113 -Getting IV fluid hydration with D5 normal saline at 75 MLS per hour -Continue to monitor and trend and repeat CMP in a.m.   Goals of Care -Dr. Roderic Palau tried to initiate goals of care conversation with patient's son on 5/29.  He wishes to discuss patient's overall condition with the patient's husband prior to discussing further goals with the medical team -I also offered palliative care consultation to assist in these conversations -With her progressive  neurologic decline and now possible pancreatic malignancy, her long-term prognosis is certainly poor. -We will likely need to reengage with palliative care to help address goals of care once further information regarding her biliary process becomes available and Palliative Care has been consulted and will await decision for ERCP if family is agreeable and if they consent ERCP -ERCP is done and stent has been placed and palliative is discussed with the family regarding neck steps and family reports that she would not be able to be cared for at home in her current state and alert trial rehab control and they want her to go to rehab to become a little bit more functional prior to returning home as they would not be able to take care of her encouraged to even with hospice services   Hypoalbuminemia -Patient's albumin level is now 2.1 x2 -Continue monitor and trend and repeat CMP in a.m.   Thrombocytopenia -Patient's platelet count went from 159 and trended down to 127 -> 145 -> 125 -Continue to monitor for signs and symptoms of bleeding; no overt bleeding noted -Repeat CBC in the AM   Bilateral lower extremity wounds -WOC nurse consulted and recommending apply Eucerin cream bilaterally after bathing and  roughly towel drying to assist with removal of loose dry skin  DVT prophylaxis: enoxaparin (LOVENOX) injection 40 mg Start: 04/21/22 2200    Code Status: DNR Family Communication: No family currently at bedside  Disposition Plan:  Level of care: Med-Surg Status is: Inpatient Remains inpatient appropriate because: Needs SNF and insurance authorization given that she is improving medically   Consultants:  Gastroenterology Palliative care medicine  Procedures:  EGD Findings:      The Z-line was regular and was found 37 cm from the incisors.      Diffuse moderate inflammation characterized by congestion (edema),       erosions and erythema was found in the entire examined stomach. Biopsies        were taken with a cold forceps for histology.      The cardia and gastric fundus were normal on retroflexion.      Area of ampulla appeared bulging likely from pancreatic mass pushing       towards Ampulla and duodenum. This area was evaluated with side-viewing       scope. Biopsies performed. Impression:               - Z-line regular, 37 cm from the incisors.                           - Chronic gastritis. Biopsied. Moderate Sedation:      Moderate (conscious) sedation was personally administered by an       anesthesia professional. The following parameters were monitored: oxygen       saturation, heart rate, blood pressure, and response to care. Recommendation:           - Return patient to hospital ward for ongoing care.                           - Resume previous diet.                           - Continue present medications.   ERCP Findings:      The major papilla was slightly bulging. Deep selective cannulation was       readily obtained on the first attempt and an obvious distal stricture       was seen in the CBD was dilated above the stricture and we proceeded       with a biliary sphincterotomy was made with a Hydratome sphincterotome       using ERBE electrocautery. There was no post-sphincterotomy bleeding. We       proceeded until we had adequate biliary drainage and could easily get       the sphincterotome in and out of the duct and cells for cytology were       obtained by brushing in the lower third of the main bile duct mostly of       the stricture in the customary fashion. One 10 Fr by 6 cm covered metal       stent with no external flaps and no internal flaps was placed 5 cm into       the common bile duct. Bile flowed through the stent. The stent was in       good position. The wire and introducer were removed and the scope was       removed and there was no pancreatic duct injection or  wire advancement       throughout the procedure and the patient  tolerated the procedure well       and there was no obvious immediate complication Impression:               - The major papilla appeared to be slightly bulging.                           - A biliary sphincterotomy was performed.                           - Cells for cytology obtained in the lower third of                            the main duct.                           - One covered metal stent was placed into the                            common bile duct. Moderate Sedation:      Not Applicable - Patient had care per Anesthesia. Recommendation:           - Clear liquid diet for 6 hours. If doing well this                            evening may slowly advance to soft diet and slowly                            advance tomorrow                           - Continue present medications.                           - Check liver enzymes (AST, ALT, alkaline                            phosphatase, bilirubin) tomorrow. And follow back                            to normal as an outpatient and her primary doctor                            could do this                           - Await cytology results. Consider oncology consult                            for possible chemo or radiation                           - Return to GI clinic PRN.                           -  Telephone GI clinic for pathology results in 1                            week.                           - Telephone GI clinic if symptomatic PRN.  Antimicrobials:  Anti-infectives (From admission, onward)    Start     Dose/Rate Route Frequency Ordered Stop   04/25/22 1230  piperacillin-tazobactam (ZOSYN) IVPB 3.375 g        3.375 g 100 mL/hr over 30 Minutes Intravenous On call to O.R. 04/24/22 1852 04/25/22 1338       Subjective: Seen and examined at bedside and she was extremely confused this morning and states that she "needed to be brought to the hospital" because she is not doing well.  I spoke with her and I told her  she is in the hospital.  Continues to be extremely confused.  Denies any specific complaints.  States that she did have some abdominal discomfort.  Otherwise feels okay.  No other concerns or complaints this time.  Objective: Vitals:   04/26/22 2011 04/27/22 0439 04/27/22 0800 04/27/22 1244  BP: (!) 136/105 (!) 146/96 (!) 154/72 (!) 160/74  Pulse: (!) 51 (!) 57 64 (!) 55  Resp: '17 20 16 14  '$ Temp: (!) 97.4 F (36.3 C) 97.9 F (36.6 C) 98.5 F (36.9 C) 97.6 F (36.4 C)  TempSrc: Oral Oral Oral Oral  SpO2: 95% 99% 98% 99%  Weight:      Height:        Intake/Output Summary (Last 24 hours) at 04/27/2022 1716 Last data filed at 04/27/2022 1600 Gross per 24 hour  Intake 210 ml  Output 1500 ml  Net -1290 ml   Filed Weights   04/23/22 1150 04/25/22 1240  Weight: 62 kg 62 kg   Examination: Physical Exam:  Constitutional: Thin chronically ill-appearing elderly Caucasian female currently in mild distress and anxious Respiratory: Diminished to auscultation bilaterally, no wheezing, rales, rhonchi or crackles. Normal respiratory effort and patient is not tachypenic. No accessory muscle use.  Unlabored breathing Cardiovascular: RRR, no murmurs / rubs / gallops. S1 and S2 auscultated.  Mild lower extremity edema Abdomen: Soft, slightly tender-tender, mildly distended second body habitus. Bowel sounds positive.  GU: Deferred. Musculoskeletal: No clubbing / cyanosis of digits/nails. No joint deformity upper and lower extremities.  Skin: Has bilateral lower extremity excoriation and erythema from scratching which is improving.  She does have some scratch marks and some dry flaky skin Neurologic: CN 2-12 grossly intact with no focal deficits.  Romberg sign and cerebellar reflexes not assessed.  Psychiatric: Impaired judgment and insight and extremely demented  Data Reviewed: I have personally reviewed following labs and imaging studies  CBC: Recent Labs  Lab 04/21/22 0529 04/22/22 0254  04/23/22 0356 04/24/22 0352 04/25/22 0343 04/26/22 0415 04/27/22 0347  WBC 7.9   < > 6.5 5.7 6.5 7.5 10.5  NEUTROABS 5.7  --   --  3.5 3.5 6.0 7.6  HGB 12.7   < > 11.6* 11.2* 10.7* 11.8* 11.3*  HCT 38.3   < > 34.1* 33.4* 32.0* 34.8* 34.5*  MCV 102.4*   < > 97.7 97.9 97.6 97.5 100.9*  PLT 171   < > 159 150 127* 145* 125*   < > = values in this interval not displayed.   Basic Metabolic Panel:  Recent Labs  Lab 04/23/22 0356 04/24/22 0352 04/25/22 0343 04/26/22 0415 04/27/22 0347  NA 139 138 140 138 139  K 3.6 4.8 3.5 3.4* 3.5  CL 107 112* 114* 111 113*  CO2 22 20* 22 22 20*  GLUCOSE 115* 105* 106* 170* 119*  BUN 9 7* 7* 8 10  CREATININE 0.83 0.56 0.67 0.73 0.73  CALCIUM 8.1* 7.6* 7.8* 7.8* 7.8*  MG  --  1.8 1.7 2.1 2.1  PHOS  --  1.9* 1.9* 2.5 1.9*   GFR: Estimated Creatinine Clearance: 46.2 mL/min (by C-G formula based on SCr of 0.73 mg/dL). Liver Function Tests: Recent Labs  Lab 04/23/22 0356 04/24/22 0352 04/25/22 0343 04/26/22 0415 04/27/22 0347  AST 461* 388* 318* 182* 85*  ALT 219* 207* 181* 157* 116*  ALKPHOS 593* 593* 570* 567* 472*  BILITOT 3.7* 4.2* 4.9* 2.4* 2.1*  PROT 4.8* 4.4* 4.7* 5.0* 4.8*  ALBUMIN 2.2* 2.2* 2.0* 2.1* 2.1*   No results for input(s): LIPASE, AMYLASE in the last 168 hours. Recent Labs  Lab 04/21/22 0529  AMMONIA 38*   Coagulation Profile: No results for input(s): INR, PROTIME in the last 168 hours. Cardiac Enzymes: No results for input(s): CKTOTAL, CKMB, CKMBINDEX, TROPONINI in the last 168 hours. BNP (last 3 results) No results for input(s): PROBNP in the last 8760 hours. HbA1C: No results for input(s): HGBA1C in the last 72 hours. CBG: Recent Labs  Lab 04/26/22 2331 04/27/22 0440 04/27/22 0739 04/27/22 1239 04/27/22 1630  GLUCAP 112* 101* 87 103* 96   Lipid Profile: No results for input(s): CHOL, HDL, LDLCALC, TRIG, CHOLHDL, LDLDIRECT in the last 72 hours. Thyroid Function Tests: No results for input(s): TSH,  T4TOTAL, FREET4, T3FREE, THYROIDAB in the last 72 hours. Anemia Panel: No results for input(s): VITAMINB12, FOLATE, FERRITIN, TIBC, IRON, RETICCTPCT in the last 72 hours. Sepsis Labs: No results for input(s): PROCALCITON, LATICACIDVEN in the last 168 hours.  No results found for this or any previous visit (from the past 240 hour(s)).   Radiology Studies: No results found.   Scheduled Meds:  enoxaparin (LOVENOX) injection  40 mg Subcutaneous Q24H   hydrocerin   Topical Daily   pantoprazole  40 mg Oral Daily   phosphorus  500 mg Oral BID   Continuous Infusions:   LOS: 6 days   Raiford Noble, DO [facility] Available via Epic secure chat 7am-7pm After these hours, please refer to coverage provider listed on [email or web address] 04/27/2022, 5:16 PM

## 2022-04-27 NOTE — Progress Notes (Signed)
Daily Progress Note   Patient Name: [name]       Date: 04/27/2022 DOB: 12-Jul-1941  Age: 81 y.o. MRN#: [id number] Attending Physician: [name], DO Primary Care Physician: [name], MD Admit Date: 04/21/2022  Reason for Consultation/Follow-up: Establishing goals of care  Subjective: I saw and examined [name].  Lying in bed in no distress at time of my encounter.  Remains confused and has no insight into her condition.  I called and spoke with her son and husband separately.  We discussed that bilirubin is dropping after stent placement and that this was hope prior to having procedure done.  Her son asked about how certain we are that this is a cancer and I told him that I believe this is very likely with noted mass and elevated CA 19-9.  We discussed next steps in her care and both her husband and her son feel that she is not going to be able to return home in her current state and functional status.  While they understand that, long-term, she is going to continue to decline they are hopeful that she will qualify her for some rehab prior to trying to transition home.  We also discussed hospice benefit and they are open to consideration of this in the future, however, at this point they feel she needs to be more functional prior to any discussion of her returning home.  Length of Stay: 6  Current Medications: Scheduled Meds:   enoxaparin (LOVENOX) injection  40 mg Subcutaneous Q24H   hydrocerin   Topical Daily   pantoprazole (PROTONIX) IV  40 mg Intravenous Q24H   phosphorus  500 mg Oral BID   potassium chloride  40 mEq Oral BID    Continuous Infusions:    PRN Meds: hydrALAZINE, ondansetron **OR** ondansetron (ZOFRAN) IV  Physical Exam   Lying in bed in no  distress.  Frail and elderly.      Declines further exam  Vital Signs: BP (!) 146/96 (BP Location: Right Arm)   Pulse (!) 57   Temp 97.9 F (36.6 C) (Oral)   Resp 20   Ht '5\' 2"'$  (1.575 m)   Wt 62 kg   SpO2 99%   BMI 25.00 kg/m  SpO2: SpO2: 99 % O2 Device: O2 Device: Room Air O2 Flow Rate:  Intake/output summary:  Intake/Output Summary (Last 24 hours) at 04/27/2022 0853 Last data filed at 04/27/2022 0600 Gross per 24 hour  Intake 360 ml  Output 700 ml  Net -340 ml    LBM: Last BM Date : 04/26/22 Baseline Weight: Weight: 62 kg Most recent weight: Weight: 62 kg       Palliative Assessment/Data:    Flowsheet Rows    Flowsheet Row Most Recent Value  Intake Tab   Referral Department Hospitalist  Unit at Time of Referral Med/Surg Unit  Palliative Care Primary Diagnosis Cancer  Date Notified 04/22/22  Palliative Care Type New Palliative care  Reason for referral Clarify Goals of Care  Date of Admission 04/21/22  Date first seen by Palliative Care 04/23/22  # of days Palliative referral response time 1 Day(s)  # of days IP prior to Palliative referral 1  Clinical Assessment   Palliative Performance Scale Score 50%  Psychosocial & Spiritual Assessment   Palliative Care Outcomes        Patient Active Problem List   Diagnosis Date Noted   Obstructive jaundice 04/21/2022   Pancreatic mass 04/21/2022   Acute encephalopathy 04/21/2022   Dementia without behavioral disturbance ([place]) 04/21/2022   HTN (hypertension) 04/21/2022   Elevated LFTs 04/22/2022   Left shoulder pain 09/11/2012    Palliative Care Assessment & Plan   Patient Profile: 81 y.o. female  with past medical history of hypertension, dementia admitted on 04/21/2022 after being transferred from [place] due to discovery of dilated CBD with obstructing 3.3 x 3 cm mass at head of the pancreas.  Plan is for EGD today (MRI was not possible due to spinal cord stimulator).  Palliative consulted for goals of  care.  Recommendations/Plan: DNR/DNI Discussed with family regarding stent placement and we continued discussion regarding next steps.  Family reports that she will not be able to be cared for at home in her current state and would like to see if she has any benefit from trial of rehab to see if she can regain functional status at time of discharge from the hospital.  We discussed hospice services as well, however, they are hopeful she will be able to go to rehab to become a little bit more functional prior to discussion about her returning home as they would not be able to care for her in her current state, even with hospice services.   Code Status:    Code Status Orders  (From admission, onward)           Start     Ordered   04/24/22 1822  Do not attempt resuscitation (DNR)  Continuous       Question Answer Comment  In the event of cardiac or respiratory ARREST Do not call a "code blue"   In the event of cardiac or respiratory ARREST Do not perform Intubation, CPR, defibrillation or ACLS   In the event of cardiac or respiratory ARREST Use medication by any route, position, wound care, and other measures to relive pain and suffering. May use oxygen, suction and manual treatment of airway obstruction as needed for comfort.      04/24/22 1821           Code Status History     Date Active Date Inactive Code Status Order ID Comments User Context   04/21/2022 0456 04/24/2022 1821 Full Code [id number]  Etta Quill, DO Inpatient      Discharge Planning: To Be Determined  Care plan was discussed with  patient, husband, son  Thank you for allowing the Palliative Medicine Team to assist in the care of this patient.  Total time: 50 minutes  Micheline Rough, MD  Please contact Palliative Medicine Team phone at [phone] for questions and concerns.

## 2022-04-27 NOTE — Progress Notes (Signed)
PHARMACIST - PHYSICIAN COMMUNICATION  DR:   Alfredia Ferguson  CONCERNING: IV to Oral Route Change Policy  RECOMMENDATION: This patient is receiving pantoprazole by the intravenous route.  Based on criteria approved by the Pharmacy and Therapeutics Committee, the intravenous medication(s) is/are being converted to the equivalent oral dose form(s).   DESCRIPTION: These criteria include: The patient is eating (either orally or via tube) and/or has been taking other orally administered medications for a least 24 hours The patient has no evidence of active gastrointestinal bleeding or impaired GI absorption (gastrectomy, short bowel, patient on TNA or NPO).  If you have questions about this conversion, please contact the Pharmacy Department  '[]'$   [phone] )  [facility] '[]'$   [phone] )  [facility] '[]'$   [phone] )  [facility] '[]'$   [phone] )  [facility] '[x]'$   [phone] )  [place], [facility] 04/27/2022 1:17 PM

## 2022-04-28 DIAGNOSIS — K831 Obstruction of bile duct: Secondary | ICD-10-CM | POA: Diagnosis not present

## 2022-04-28 DIAGNOSIS — R7989 Other specified abnormal findings of blood chemistry: Secondary | ICD-10-CM | POA: Diagnosis not present

## 2022-04-28 DIAGNOSIS — G9340 Encephalopathy, unspecified: Secondary | ICD-10-CM | POA: Diagnosis not present

## 2022-04-28 DIAGNOSIS — F0390 Unspecified dementia without behavioral disturbance: Secondary | ICD-10-CM | POA: Diagnosis not present

## 2022-04-28 LAB — COMPREHENSIVE METABOLIC PANEL
ALT: 91 U/L — ABNORMAL HIGH (ref 0–44)
AST: 50 U/L — ABNORMAL HIGH (ref 15–41)
Albumin: 2 g/dL — ABNORMAL LOW (ref 3.5–5.0)
Alkaline Phosphatase: 424 U/L — ABNORMAL HIGH (ref 38–126)
Anion gap: 6 (ref 5–15)
BUN: 11 mg/dL (ref 8–23)
CO2: 22 mmol/L (ref 22–32)
Calcium: 7.7 mg/dL — ABNORMAL LOW (ref 8.9–10.3)
Chloride: 109 mmol/L (ref 98–111)
Creatinine, Ser: 0.64 mg/dL (ref 0.44–1.00)
GFR, Estimated: >60 mL/min (ref >60)
Glucose, Bld: 79 mg/dL (ref 70–99)
Potassium: 4.9 mmol/L (ref 3.5–5.1)
Sodium: 137 mmol/L (ref 135–145)
Total Bilirubin: 2.1 mg/dL — ABNORMAL HIGH (ref 0.3–1.2)
Total Protein: 4.7 g/dL — ABNORMAL LOW (ref 6.5–8.1)

## 2022-04-28 LAB — CBC WITH DIFFERENTIAL/PLATELET
Abs Immature Granulocytes: 0.15 10*3/uL — ABNORMAL HIGH (ref 0.00–0.07)
Basophils Absolute: 0 10*3/uL (ref 0.0–0.1)
Basophils Relative: 0 %
Eosinophils Absolute: 0.1 10*3/uL (ref 0.0–0.5)
Eosinophils Relative: 1 %
HCT: 35.8 % — ABNORMAL LOW (ref 36.0–46.0)
Hemoglobin: 11.9 g/dL — ABNORMAL LOW (ref 12.0–15.0)
Immature Granulocytes: 1 %
Lymphocytes Relative: 22 %
Lymphs Abs: 3.1 10*3/uL (ref 0.7–4.0)
MCH: 32.5 pg (ref 26.0–34.0)
MCHC: 33.2 g/dL (ref 30.0–36.0)
MCV: 97.8 fL (ref 80.0–100.0)
Monocytes Absolute: 1.2 10*3/uL — ABNORMAL HIGH (ref 0.1–1.0)
Monocytes Relative: 9 %
Neutro Abs: 9.4 10*3/uL — ABNORMAL HIGH (ref 1.7–7.7)
Neutrophils Relative %: 67 %
Platelets: 81 10*3/uL — ABNORMAL LOW (ref 150–400)
RBC: 3.66 MIL/uL — ABNORMAL LOW (ref 3.87–5.11)
RDW: 14.9 % (ref 11.5–15.5)
WBC: 14.1 10*3/uL — ABNORMAL HIGH (ref 4.0–10.5)
nRBC: 0 % (ref 0.0–0.2)

## 2022-04-28 LAB — GLUCOSE, CAPILLARY
Glucose-Capillary: 110 mg/dL — ABNORMAL HIGH (ref 70–99)
Glucose-Capillary: 111 mg/dL — ABNORMAL HIGH (ref 70–99)
Glucose-Capillary: 117 mg/dL — ABNORMAL HIGH (ref 70–99)
Glucose-Capillary: 134 mg/dL — ABNORMAL HIGH (ref 70–99)
Glucose-Capillary: 71 mg/dL (ref 70–99)
Glucose-Capillary: 91 mg/dL (ref 70–99)

## 2022-04-28 LAB — PHOSPHORUS: Phosphorus: 2.7 mg/dL (ref 2.5–4.6)

## 2022-04-28 LAB — MAGNESIUM: Magnesium: 1.7 mg/dL (ref 1.7–2.4)

## 2022-04-28 LAB — CYTOLOGY - NON PAP

## 2022-04-28 MED ORDER — OLANZAPINE 5 MG PO TBDP
5.0000 mg | ORAL_TABLET | Freq: Two times a day (BID) | ORAL | Status: DC | PRN
  Administered 2022-04-28: 5 mg via ORAL
  Filled 2022-04-28 (×2): qty 1

## 2022-04-28 MED ORDER — LORAZEPAM 2 MG/ML IJ SOLN
0.2500 mg | Freq: Once | INTRAMUSCULAR | Status: AC
Start: 2022-04-28 — End: 2022-04-28
  Administered 2022-04-28: 0.25 mg via INTRAVENOUS
  Filled 2022-04-28: qty 1

## 2022-04-28 NOTE — Progress Notes (Signed)
PROGRESS NOTE    [name]  DGU:[id number] DOB: [birth year] DOA: 04/21/2022 PCP: [name], MD   Brief Narrative:  The patient is an 81 year old female with a history of hypertension, hyperlipidemia, dementia who lives at home with her husband, brought to the hospital with worsening mental status.  She initially presented to [facility] where she was noted to have elevated LFTs.  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  Due to lack of GI coverage, she was transferred to [facility] for further work-up.  GI following now and took the patient for EGD and Biopsies were taken and EGD also showed gastritis.  GI speaking with the patient's family and consideration for an ERCP and family and they are agreeable so she underwent ERCP 04/25/22 by Dr. Clarene Essex.      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 for EGD -CT abdomen done at [facility] showed dilated biliary and pancreatic ducts with possible pancreatic head mass versus ampullary mass -GI following and plan is for EGD today -Unable to do MRI since patient has spinal stimulator in place -Continue to Hold statin -AST went from 444 -> 461 -> 388 -> 318 -> 182 -> 85 -> 50and ALT went from 212 -> 219 -> 207 -> 181 -> 157 -> 116 -> 91 -T Bili went from 3.1 -> 3.7 -> 4.2 -> 4.9 -> 2.4 -> 2.1 x2 and now finally improving  -CEA was 3.1, AFP serum tumor marker was 2.7 but CA 19-9 was significantly elevated at 130 -Further Care per GI: She underwent an  EGD which showed bulging at the area of the ampulla from the pancreatic mass pushing towards the ampulla of the duodenum with no discrete mass seen.  Biopsies were performed and EGD also showed gastritis so she is placed on IV pantoprazole.  GI recommending following the biopsies and discussed the case further and they feel the EUS is not indicated at this time and the family is not interested in surgical options but they are recommending considering ERCP for further evaluation. -The GI team is discussing ERCP with the patient and patient's family and they are going to discuss with rest of family and talk over but she has a spot held for an ERCP tomorrow if they are agreeable; patient underwent ERCP today and showed that the major papilla was slightly bulging and deep selective cannulation was will be obtained first attempt. GI did a biliary sphincterotomy that was performed and cells for cytology were obtained in the lower third of the main duct and a covered metal stent was placed into the common bile duct. -GI recommending a clear liquid diet for 6 hours and then advance diet as tolerated slowly; now she is tolerating a soft diet -GI recommending awaiting cytology results and following up with the GI clinic as needed -Consulted Palliative Care for further GOC Discussion; see below -PT OT recommending SNF -Per Discussion with TOC not bed available today    Dementia without behavioral disturbance with superimposed Acute Encephalopathy -Dr. Roderic Palau Discussed with patient's son and he describes that patient has fairly advanced dementia -  He reports that she has been frequently sundowning at home in the last several months -She is also had worsening confusion during the day -He has noted progressive decline -Ammonia Level was 38 and TSH was 3.144 -Analysis was done and showed clear appearance with amber-colored urine, small hemoglobin, 20 ketones, large leukocytes, negative nitrites, pH 6.0, rare bacteria,  0-5 RBCs per high-power field, 0-5 squamous epithelial cells, 21-50 WBCs -Delirium Precautions as she remains can use   Hypoglycemia -No longer n.p.o. and so we will stop IV fluid hydration -She was Started on dextrose infusion and now CBG's ranging from 71-134; currently getting D5 half-normal saline at 75 MLS per hour and now that she is tolerating Soft Diet will stop IVF    Hypertension -Chronically on atenolol -Holding for now since she has had some episodes of bradycardia and is also currently normotensive -Continue to monitor blood pressures per protocol; last blood pressure reading was now slightly elevated at 116/75  Hyperlipidemia -Holding statin in light of elevated LFTs   Normocytic Anemia -Hgb/Hct went from 12.7/38.3 -> 12.6/37.5 -> 11.6/34.1 -> 11.2/33.4 -> 10.7/32.0 -> 11.8/34.8 -> 11.3/34.5 -> 11.9/35.8 -Check Anemia Panel in the AM -Continue to Monitor for S/Sx of Bleeding; No overt bleeding noted -Repeat CBC in the AM    Hypokalemia -Patient's Phos Level is now gone from 4.8 -> 3.5 -> 3.4 -> 3.5 -> 4.9 -Replete with po K-Phos Neutral 500 g p.o. twice daily x2 doses and received 40 mill colons of p.o. KCl twice daily x2 doses yesterday -Continue to Monitor and Replete as Necessary -Repeat CMP in the AM    Hypophosphatemia -His phosphorus level is now 2.7 -Continue to Monitor and Replete as Necessary -Repeat Phos Level in the AM    Metabolic Acidosis, improved -Patient CO2 is now 22, anion gap is 6, chloride level 109 -Getting IV fluid hydration with D5 normal saline at 75 MLS per hour -Continue to monitor and trend and repeat CMP in a.m.   Goals of Care -Dr. Roderic Palau tried to initiate goals of care conversation with patient's son on 5/29.  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 -We will likely need to reengage with palliative care to help address goals of care once further information regarding her biliary process becomes available and Palliative Care has been consulted and will await decision for ERCP if family is agreeable and if they consent ERCP -ERCP is done and stent has been placed and palliative is discussed with the family regarding neck steps and family reports that she would not be able to be cared for at home in her current state and alert trial rehab control and they want her to go to rehab to become a little bit more functional prior to returning home as they would not be able to take care of her encouraged to even with hospice services -Plan is to go to SNF   Hypoalbuminemia -Patient's albumin level is now 2.1 x2 -> 2.0 -Continue monitor and trend and repeat CMP in a.m.  Leukocytosis -Mild and likely reactive -WBC went from 5.7 -> 6.5 -> 7.5 -> 10.5 -> 14.1 -Continue to Monitor for S/Sx of Infection -Repeat CBC in the AM    Thrombocytopenia -Patient's platelet count went from 159 and trended down  to 127 -> 145 -> 125 -> 81 -Continue to monitor for signs and symptoms of bleeding; no overt bleeding noted -Repeat CBC in the AM   Bilateral lower extremity wounds, improving -WOC nurse consulted and recommending apply Eucerin cream bilaterally after bathing and roughly towel drying to assist with removal of loose dry skin  DVT prophylaxis: enoxaparin (LOVENOX) injection 40 mg Start: 04/21/22 2200    Code Status: DNR Family Communication: No family present at bedside   Disposition Plan:  Level of care: Med-Surg Status is: Inpatient Remains inpatient appropriate because: Needs SNF and insurance authorization is still pending   Consultants:  Palliative Care Medicine  Gastroenterology  Procedures:  EGD Findings:      The Z-line was  regular and was found 37 cm from the incisors.      Diffuse moderate inflammation characterized by congestion (edema),       erosions and erythema was found in the entire examined stomach. Biopsies       were taken with a cold forceps for histology.      The cardia and gastric fundus were normal on retroflexion.      Area of ampulla appeared bulging likely from pancreatic mass pushing       towards Ampulla and duodenum. This area was evaluated with side-viewing       scope. Biopsies performed. Impression:               - Z-line regular, 37 cm from the incisors.                           - Chronic gastritis. Biopsied. Moderate Sedation:      Moderate (conscious) sedation was personally administered by an       anesthesia professional. The following parameters were monitored: oxygen       saturation, heart rate, blood pressure, and response to care. Recommendation:           - Return patient to hospital ward for ongoing care.                           - Resume previous diet.                           - Continue present medications.   ERCP Findings:      The major papilla was slightly bulging. Deep selective cannulation was       readily obtained on the first attempt and an obvious distal stricture       was seen in the CBD was dilated above the stricture and we proceeded       with a biliary sphincterotomy was made with a Hydratome sphincterotome       using ERBE electrocautery. There was no post-sphincterotomy bleeding. We       proceeded until we had adequate biliary drainage and could easily get       the sphincterotome in and out of the duct and cells for cytology were       obtained by brushing in the lower third of the main bile duct mostly of       the stricture in the customary fashion. One 10 Fr by 6 cm covered metal       stent with no external flaps and no internal flaps was placed 5 cm into       the common  bile duct. Bile flowed through the stent. The stent was in       good  position. The wire and introducer were removed and the scope was       removed and there was no pancreatic duct injection or wire advancement       throughout the procedure and the patient tolerated the procedure well       and there was no obvious immediate complication Impression:               - The major papilla appeared to be slightly bulging.                           - A biliary sphincterotomy was performed.                           - Cells for cytology obtained in the lower third of                            the main duct.                           - One covered metal stent was placed into the                            common bile duct. Moderate Sedation:      Not Applicable - Patient had care per Anesthesia. Recommendation:           - Clear liquid diet for 6 hours. If doing well this                            evening may slowly advance to soft diet and slowly                            advance tomorrow                           - Continue present medications.                           - Check liver enzymes (AST, ALT, alkaline                            phosphatase, bilirubin) tomorrow. And follow back                            to normal as an outpatient and her primary doctor                            could do this                           - Await cytology results. Consider oncology consult                            for possible chemo or radiation                           -  Return to GI clinic PRN.                           - Telephone GI clinic for pathology results in 1                            week.                           - Telephone GI clinic if symptomatic PRN.   Antimicrobials:  Anti-infectives (From admission, onward)    Start     Dose/Rate Route Frequency Ordered Stop   04/25/22 1230  piperacillin-tazobactam (ZOSYN) IVPB 3.375 g        3.375 g 100 mL/hr over 30 Minutes Intravenous On call to O.R. 04/24/22 1852 04/25/22 1338       Subjective: Seen  and examined at bedside sitting in the chair and she thinks she is doing little bit better.  States that she is sore but states it is improving.  No nausea or vomiting.  Denies any lightheadedness or dizziness.  No other concerns or complaints at this time remains pleasantly confused and demented.  Objective: Vitals:   04/27/22 0800 04/27/22 1244 04/27/22 2005 04/28/22 0342  BP: (!) 154/72 (!) 160/74 (!) 146/102 116/75  Pulse: 64 (!) 55 80 74  Resp: '16 14 18 16  '$ Temp: 98.5 F (36.9 C) 97.6 F (36.4 C) 97.9 F (36.6 C) 97.7 F (36.5 C)  TempSrc: Oral Oral  Axillary  SpO2: 98% 99% 96% 95%  Weight:      Height:        Intake/Output Summary (Last 24 hours) at 04/28/2022 1224 Last data filed at 04/28/2022 1200 Gross per 24 hour  Intake 600 ml  Output 550 ml  Net 50 ml   Filed Weights   04/23/22 1150 04/25/22 1240  Weight: 62 kg 62 kg   Examination: Physical Exam:  Constitutional: Thin chronically ill-appearing elderly Caucasian female who is pleasantly demented and in no acute distress currently Respiratory: Diminished to auscultation bilaterally, no wheezing, rales, rhonchi or crackles. Normal respiratory effort and patient is not tachypenic. No accessory muscle use.  Unlabored breathing Cardiovascular: RRR, no murmurs / rubs / gallops. S1 and S2 auscultated.  Slight lower extremity edema Abdomen: Soft, non-tender, mildly distended. Bowel sounds positive.  GU: Deferred. Musculoskeletal: No clubbing / cyanosis of digits/nails. No joint deformity upper and lower extremities. Skin: She has bilateral lower extremity excoriation and erythema from scratching which is improved significantly and she does still have some dry flaky skin Neurologic: CN 2-12 grossly intact with no focal deficits but she is hard of hearing. Romberg sign cerebellar reflexes not assessed.  Psychiatric: Impaired judgment and insight.  Appears calm and pleasantly demented   Data Reviewed: I have personally reviewed  following labs and imaging studies  CBC: Recent Labs  Lab 04/24/22 0352 04/25/22 0343 04/26/22 0415 04/27/22 0347 04/28/22 0744  WBC 5.7 6.5 7.5 10.5 14.1*  NEUTROABS 3.5 3.5 6.0 7.6 9.4*  HGB 11.2* 10.7* 11.8* 11.3* 11.9*  HCT 33.4* 32.0* 34.8* 34.5* 35.8*  MCV 97.9 97.6 97.5 100.9* 97.8  PLT 150 127* 145* 125* 81*   Basic Metabolic Panel: Recent Labs  Lab 04/24/22 0352 04/25/22 0343 04/26/22 0415 04/27/22 0347 04/28/22 0744  NA 138 140 138 139 137  K 4.8 3.5 3.4* 3.5 4.9  CL 112* 114*  111 113* 109  CO2 20* 22 22 20* 22  GLUCOSE 105* 106* 170* 119* 79  BUN 7* 7* '8 10 11  '$ CREATININE 0.56 0.67 0.73 0.73 0.64  CALCIUM 7.6* 7.8* 7.8* 7.8* 7.7*  MG 1.8 1.7 2.1 2.1 1.7  PHOS 1.9* 1.9* 2.5 1.9* 2.7   GFR: Estimated Creatinine Clearance: 46.2 mL/min (by C-G formula based on SCr of 0.64 mg/dL). Liver Function Tests: Recent Labs  Lab 04/24/22 0352 04/25/22 0343 04/26/22 0415 04/27/22 0347 04/28/22 0744  AST 388* 318* 182* 85* 50*  ALT 207* 181* 157* 116* 91*  ALKPHOS 593* 570* 567* 472* 424*  BILITOT 4.2* 4.9* 2.4* 2.1* 2.1*  PROT 4.4* 4.7* 5.0* 4.8* 4.7*  ALBUMIN 2.2* 2.0* 2.1* 2.1* 2.0*   No results for input(s): LIPASE, AMYLASE in the last 168 hours. No results for input(s): AMMONIA in the last 168 hours. Coagulation Profile: No results for input(s): INR, PROTIME in the last 168 hours. Cardiac Enzymes: No results for input(s): CKTOTAL, CKMB, CKMBINDEX, TROPONINI in the last 168 hours. BNP (last 3 results) No results for input(s): PROBNP in the last 8760 hours. HbA1C: No results for input(s): HGBA1C in the last 72 hours. CBG: Recent Labs  Lab 04/27/22 1630 04/27/22 1938 04/27/22 2359 04/28/22 0345 04/28/22 0752  GLUCAP 96 118* 111* 91 71   Lipid Profile: No results for input(s): CHOL, HDL, LDLCALC, TRIG, CHOLHDL, LDLDIRECT in the last 72 hours. Thyroid Function Tests: No results for input(s): TSH, T4TOTAL, FREET4, T3FREE, THYROIDAB in the last 72  hours. Anemia Panel: No results for input(s): VITAMINB12, FOLATE, FERRITIN, TIBC, IRON, RETICCTPCT in the last 72 hours. Sepsis Labs: No results for input(s): PROCALCITON, LATICACIDVEN in the last 168 hours.  No results found for this or any previous visit (from the past 240 hour(s)).   Radiology Studies: No results found.  Scheduled Meds:  enoxaparin (LOVENOX) injection  40 mg Subcutaneous Q24H   hydrocerin   Topical Daily   pantoprazole  40 mg Oral Daily   Continuous Infusions:   LOS: 6 days   Raiford Noble, DO [facility] Available via Epic secure chat 7am-7pm After these hours, please refer to coverage provider listed on [email or web address] 04/28/2022, 12:24 PM

## 2022-04-28 NOTE — TOC Progression Note (Addendum)
Transition of Care [facility]) - Progression Note    Patient Details  Name: [name] MRN: [id number] Date of Birth: 09/10/1941  [facility] CM/SW Contact  Ross Ludwig, [place] Phone Number: 04/28/2022, 1:26 PM  Clinical Narrative:     CSW spoke to [name] [phone] to discuss SNF placement.  Per patient's husband he would prefer closer to where they live in [place].  CSW sent information to SNFs in [facility] for them to review.  Patient's husband said somewhere close to where they live.  Patient's husband requested [facility].  CSW called and spoke to [place] in admissions she will review and let CSW know.  2:50pm  CSW spoke to patient's husband regarding snf placement options.  He is going to speak to his son and decide which facility they have chosen.  CSW informed husband that [facility] offered so did [facility].  Patient's husband stated he will follow up with TOC in morning.  CSW explained that Medicare will only cover rehab, not LTC.  CSW informed patient he should apply for Medicaid and look into paying privately.   Expected Discharge Plan: [place] Barriers to Discharge: Continued Medical Work up  Expected Discharge Plan and Services Expected Discharge Plan: [place]   Discharge Planning Services: CM Consult   Living arrangements for the past 2 months: Single Family Home                                       Social Determinants of Health (SDOH) Interventions    Readmission Risk Interventions     View : No data to display.

## 2022-04-28 NOTE — Progress Notes (Signed)
Occupational Therapy Treatment Patient Details Name: [name] MRN: [id number] DOB: [birth year] Today's Date: 04/28/2022   History of present illness "81 year old female with a history of hypertension, hyperlipidemia, dementia who lives at home with her husband, brought to the hospital with worsening mental status.  She initially presented to [facility] where she was noted to have elevated LFTs.  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  Due to lack of GI coverage, she was transferred to [facility] for further work-up.  GI following now and took the patient for EGD and Biopsies were taken and EGD also showed gastritis.  Pt s/p ERCP 04/25/22.   OT comments  Patient was noted to be lethargic during session with increased time to participate in all tasks. Patient responded better with speaking into R ear with increased volume. Patient was able to transfer with +2 assist on this date. Patient was able to wash face in chair position in bed with increased time and cues to ensure completion of task. Patient's discharge plan remains appropriate at this time. OT will continue to follow acutely.     Recommendations for follow up therapy are one component of a multi-disciplinary discharge planning process, led by the attending physician.  Recommendations may be updated based on patient status, additional functional criteria and insurance authorization.    Follow Up Recommendations  Skilled nursing-short term rehab (<3 hours/day)    Assistance Recommended at Discharge Frequent or constant Supervision/Assistance  Patient can return home with the following  A lot of help with walking and/or transfers;A lot of help with bathing/dressing/bathroom;Assistance with cooking/housework;Direct supervision/assist for medications management;Help with stairs or ramp for entrance;Direct supervision/assist for financial management   Equipment Recommendations  Other (comment) (defer  to next venue)    Recommendations for Other Services      Precautions / Restrictions Precautions Precautions: Fall Precaution Comments: LE wounds, likely incontinent, VERY HOH Restrictions Weight Bearing Restrictions: No       Mobility Bed Mobility Overal bed mobility: Needs Assistance Bed Mobility: Sit to Supine       Sit to supine: Max assist   General bed mobility comments: assist to bring trunk and bLE into bed.    Transfers                         Balance Overall balance assessment: Needs assistance Sitting-balance support: No upper extremity supported, Feet supported Sitting balance-Leahy Scale: Fair     Standing balance support: Bilateral upper extremity supported, Reliant on assistive device for balance Standing balance-Leahy Scale: Poor                             ADL either performed or assessed with clinical judgement   ADL Overall ADL's : Needs assistance/impaired     Grooming: Set up;Cueing for sequencing;Wash/dry face;Bed level Grooming Details (indicate cue type and reason): and to ensure throughness of task, in chair position in bed.                               General ADL Comments: patient was sitting in recliner in room wiht BLE hanging over the edge of the foot rest. patient was +2 to scoot back in recliner and stand from recliner with increased time. patietn was max A +2 to transfer from edge of recliner to edge of bed with increased cues  for sequencing of task. patient needed increased time to participate in all tasks. patient was noted to have a hard time hearing therapist during session unless speaking into R ear. patient's nurse made aware. nurse was present to assist with getting back into bed    Extremity/Trunk Assessment              Vision       Perception     Praxis      Cognition Arousal/Alertness: Awake/alert Behavior During Therapy: WFL for tasks assessed/performed Overall Cognitive  Status: History of cognitive impairments - at baseline                                 General Comments: hx dementia, HOH        Exercises      Shoulder Instructions       General Comments      Pertinent Vitals/ Pain       Pain Assessment Pain Assessment: No/denies pain  Home Living                                          Prior Functioning/Environment              Frequency  Min 2X/week        Progress Toward Goals  OT Goals(current goals can now be found in the care plan section)  Progress towards OT goals: OT to reassess next treatment     Plan Discharge plan remains appropriate    Co-evaluation                 AM-PAC OT "6 Clicks" Daily Activity     Outcome Measure   Help from another person eating meals?: A Little Help from another person taking care of personal grooming?: A Little Help from another person toileting, which includes using toliet, bedpan, or urinal?: A Lot Help from another person bathing (including washing, rinsing, drying)?: A Lot Help from another person to put on and taking off regular upper body clothing?: A Lot Help from another person to put on and taking off regular lower body clothing?: A Lot 6 Click Score: 14    End of Session    OT Visit Diagnosis: Other symptoms and signs involving cognitive function   Activity Tolerance Patient limited by lethargy   Patient Left in bed;with call bell/phone within reach;with bed alarm set   Nurse Communication Mobility status        Time: 7829-5621 OT Time Calculation (min): 12 min  Charges: OT General Charges $OT Visit: 1 Visit OT Treatments $Self Care/Home Management : 8-22 mins  Jackelyn Poling OTR/L, MS Acute Rehabilitation Department Office# [phone] Pager# [phone]   Marcellina Millin 04/28/2022, 11:27 AM

## 2022-04-28 NOTE — Progress Notes (Signed)
Physical Therapy Treatment Patient Details Name: [name] MRN: [id number] DOB: 1941-02-20 Today's Date: 04/28/2022   History of Present Illness "81 year old female with a history of hypertension, hyperlipidemia, dementia who lives at home with her husband, brought to the hospital with worsening mental status.  She initially presented to [facility] where she was noted to have elevated LFTs.  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  Due to lack of GI coverage, she was transferred to [facility] for further work-up.  GI following now and took the patient for EGD and Biopsies were taken and EGD also showed gastritis.  Pt s/p ERCP 04/25/22.    PT Comments    Pt agreeable for OOB and assisted with ambulating in room.  Pt continues to require at least mod assist for mobility and high risk for falls at this time.  Palliative care following and per notes, pt with plans to d/c to SNF as family feels unable to assist pt at home at current level.     Recommendations for follow up therapy are one component of a multi-disciplinary discharge planning process, led by the attending physician.  Recommendations may be updated based on patient status, additional functional criteria and insurance authorization.  Follow Up Recommendations  Skilled nursing-short term rehab (<3 hours/day)     Assistance Recommended at Discharge Frequent or constant Supervision/Assistance  Patient can return home with the following A lot of help with walking and/or transfers;A lot of help with bathing/dressing/bathroom   Equipment Recommendations  None recommended by PT    Recommendations for Other Services       Precautions / Restrictions Precautions Precautions: Fall Precaution Comments: LE wounds, likely incontinent, VERY HOH Restrictions Weight Bearing Restrictions: No     Mobility  Bed Mobility Overal bed mobility: Needs Assistance Bed Mobility: Supine to Sit      Supine to sit: Mod assist     General bed mobility comments: assist to bring trunk upright and scoot to EOB    Transfers Overall transfer level: Needs assistance Equipment used: Rolling walker (2 wheels) Transfers: Sit to/from Stand Sit to Stand: Mod assist, +2 safety/equipment           General transfer comment: multimodal cues for technique and correcting posterior bias    Ambulation/Gait Ambulation/Gait assistance: Min assist Gait Distance (Feet): 12 Feet Assistive device: Rolling walker (2 wheels) Gait Pattern/deviations: Step-through pattern, Decreased stride length, Trunk flexed, Narrow base of support       General Gait Details: requiring assist for stability, mutlimodal cues for stabilizing and use of RW, recliner following closely for safety   Stairs             Wheelchair Mobility    Modified Rankin (Stroke Patients Only)       Balance Overall balance assessment: Needs assistance         Standing balance support: Bilateral upper extremity supported, Reliant on assistive device for balance Standing balance-Leahy Scale: Poor Standing balance comment: requring RW and mod external assist for activity                            Cognition Arousal/Alertness: Awake/alert Behavior During Therapy: WFL for tasks assessed/performed Overall Cognitive Status: History of cognitive impairments - at baseline  General Comments: hx dementia        Exercises      General Comments        Pertinent Vitals/Pain Pain Assessment Pain Assessment: PAINAD Breathing: normal Negative Vocalization: none Facial Expression: smiling or inexpressive Body Language: relaxed Consolability: no need to console PAINAD Score: 0 Pain Intervention(s): Monitored during session, Repositioned    Home Living                          Prior Function            PT Goals (current goals can now be  found in the care plan section) Progress towards PT goals: Progressing toward goals    Frequency    Min 2X/week      PT Plan Current plan remains appropriate    Co-evaluation              AM-PAC PT "6 Clicks" Mobility   Outcome Measure  Help needed turning from your back to your side while in a flat bed without using bedrails?: A Lot Help needed moving from lying on your back to sitting on the side of a flat bed without using bedrails?: A Lot Help needed moving to and from a bed to a chair (including a wheelchair)?: A Lot Help needed standing up from a chair using your arms (e.g., wheelchair or bedside chair)?: Total Help needed to walk in hospital room?: Total Help needed climbing 3-5 steps with a railing? : Total 6 Click Score: 9    End of Session Equipment Utilized During Treatment: Gait belt Activity Tolerance: Patient tolerated treatment well Patient left: in chair;with call bell/phone within reach;with chair alarm set;Other (comment) (mittens in place)   PT Visit Diagnosis: Difficulty in walking, not elsewhere classified (R26.2);Muscle weakness (generalized) (M62.81)     Time: 7026-3785 PT Time Calculation (min) (ACUTE ONLY): 11 min  Charges:  $Gait Training: 8-22 mins                    Jannette Spanner PT, DPT Acute Rehabilitation Services Pager: [phone] Office: [place] 04/28/2022, 11:21 AM

## 2022-04-28 NOTE — Anesthesia Postprocedure Evaluation (Signed)
Anesthesia Post Note  Patient: [name]  Procedure(s) Performed: ENDOSCOPIC RETROGRADE CHOLANGIOPANCREATOGRAPHY (ERCP) SPHINCTEROTOMY BILIARY STENT PLACEMENT BILIARY BRUSHING     Patient location during evaluation: PACU Anesthesia Type: General Level of consciousness: awake and alert Pain management: pain level controlled Vital Signs Assessment: post-procedure vital signs reviewed and stable Respiratory status: spontaneous breathing, nonlabored ventilation, respiratory function stable and patient connected to nasal cannula oxygen Cardiovascular status: blood pressure returned to baseline and stable Postop Assessment: no apparent nausea or vomiting Anesthetic complications: no   No notable events documented.  Last Vitals:  Vitals:   04/27/22 2005 04/28/22 0342  BP: (!) 146/102 116/75  Pulse: 80 74  Resp: 18 16  Temp: 36.6 C 36.5 C  SpO2: 96% 95%    Last Pain:  Vitals:   04/28/22 0342  TempSrc: Axillary  PainSc: 1                  [name] S

## 2022-04-29 ENCOUNTER — Encounter: Payer: Self-pay | Admitting: Adult Health

## 2022-04-29 DIAGNOSIS — R262 Difficulty in walking, not elsewhere classified: Secondary | ICD-10-CM | POA: Diagnosis not present

## 2022-04-29 DIAGNOSIS — H409 Unspecified glaucoma: Secondary | ICD-10-CM | POA: Diagnosis not present

## 2022-04-29 DIAGNOSIS — I10 Essential (primary) hypertension: Secondary | ICD-10-CM | POA: Diagnosis not present

## 2022-04-29 DIAGNOSIS — M2550 Pain in unspecified joint: Secondary | ICD-10-CM | POA: Diagnosis not present

## 2022-04-29 DIAGNOSIS — F0390 Unspecified dementia without behavioral disturbance: Secondary | ICD-10-CM | POA: Diagnosis not present

## 2022-04-29 DIAGNOSIS — K869 Disease of pancreas, unspecified: Secondary | ICD-10-CM | POA: Diagnosis not present

## 2022-04-29 DIAGNOSIS — D696 Thrombocytopenia, unspecified: Secondary | ICD-10-CM | POA: Diagnosis not present

## 2022-04-29 DIAGNOSIS — H259 Unspecified age-related cataract: Secondary | ICD-10-CM | POA: Diagnosis not present

## 2022-04-29 DIAGNOSIS — K219 Gastro-esophageal reflux disease without esophagitis: Secondary | ICD-10-CM | POA: Diagnosis not present

## 2022-04-29 DIAGNOSIS — E785 Hyperlipidemia, unspecified: Secondary | ICD-10-CM | POA: Diagnosis not present

## 2022-04-29 DIAGNOSIS — K8689 Other specified diseases of pancreas: Secondary | ICD-10-CM | POA: Diagnosis not present

## 2022-04-29 DIAGNOSIS — D649 Anemia, unspecified: Secondary | ICD-10-CM | POA: Diagnosis not present

## 2022-04-29 DIAGNOSIS — R456 Violent behavior: Secondary | ICD-10-CM | POA: Diagnosis not present

## 2022-04-29 DIAGNOSIS — R41841 Cognitive communication deficit: Secondary | ICD-10-CM | POA: Diagnosis not present

## 2022-04-29 DIAGNOSIS — Z7401 Bed confinement status: Secondary | ICD-10-CM | POA: Diagnosis not present

## 2022-04-29 DIAGNOSIS — K2970 Gastritis, unspecified, without bleeding: Secondary | ICD-10-CM | POA: Diagnosis not present

## 2022-04-29 DIAGNOSIS — G9340 Encephalopathy, unspecified: Secondary | ICD-10-CM | POA: Diagnosis not present

## 2022-04-29 DIAGNOSIS — D509 Iron deficiency anemia, unspecified: Secondary | ICD-10-CM | POA: Diagnosis not present

## 2022-04-29 DIAGNOSIS — C253 Malignant neoplasm of pancreatic duct: Secondary | ICD-10-CM | POA: Diagnosis not present

## 2022-04-29 DIAGNOSIS — L89890 Pressure ulcer of other site, unstageable: Secondary | ICD-10-CM | POA: Diagnosis not present

## 2022-04-29 DIAGNOSIS — R7989 Other specified abnormal findings of blood chemistry: Secondary | ICD-10-CM | POA: Diagnosis not present

## 2022-04-29 DIAGNOSIS — R278 Other lack of coordination: Secondary | ICD-10-CM | POA: Diagnosis not present

## 2022-04-29 DIAGNOSIS — K831 Obstruction of bile duct: Secondary | ICD-10-CM | POA: Diagnosis not present

## 2022-04-29 DIAGNOSIS — R1312 Dysphagia, oropharyngeal phase: Secondary | ICD-10-CM | POA: Diagnosis not present

## 2022-04-29 DIAGNOSIS — M6259 Muscle wasting and atrophy, not elsewhere classified, multiple sites: Secondary | ICD-10-CM | POA: Diagnosis not present

## 2022-04-29 LAB — COMPREHENSIVE METABOLIC PANEL
ALT: 73 U/L — ABNORMAL HIGH (ref 0–44)
AST: 31 U/L (ref 15–41)
Albumin: 2.2 g/dL — ABNORMAL LOW (ref 3.5–5.0)
Alkaline Phosphatase: 393 U/L — ABNORMAL HIGH (ref 38–126)
Anion gap: 6 (ref 5–15)
BUN: 12 mg/dL (ref 8–23)
CO2: 24 mmol/L (ref 22–32)
Calcium: 8 mg/dL — ABNORMAL LOW (ref 8.9–10.3)
Chloride: 105 mmol/L (ref 98–111)
Creatinine, Ser: 0.71 mg/dL (ref 0.44–1.00)
GFR, Estimated: >60 mL/min (ref >60)
Glucose, Bld: 94 mg/dL (ref 70–99)
Potassium: 3.5 mmol/L (ref 3.5–5.1)
Sodium: 135 mmol/L (ref 135–145)
Total Bilirubin: 1.8 mg/dL — ABNORMAL HIGH (ref 0.3–1.2)
Total Protein: 5.1 g/dL — ABNORMAL LOW (ref 6.5–8.1)

## 2022-04-29 LAB — RETICULOCYTES
Immature Retic Fract: 13.9 % (ref 2.3–15.9)
RBC.: 3.6 MIL/uL — ABNORMAL LOW (ref 3.87–5.11)
Retic Count, Absolute: 62.3 10*3/uL (ref 19.0–186.0)
Retic Ct Pct: 1.7 % (ref 0.4–3.1)

## 2022-04-29 LAB — CBC WITH DIFFERENTIAL/PLATELET
Abs Immature Granulocytes: 0.07 10*3/uL (ref 0.00–0.07)
Basophils Absolute: 0 10*3/uL (ref 0.0–0.1)
Basophils Relative: 0 %
Eosinophils Absolute: 0.1 10*3/uL (ref 0.0–0.5)
Eosinophils Relative: 1 %
HCT: 35.9 % — ABNORMAL LOW (ref 36.0–46.0)
Hemoglobin: 11.9 g/dL — ABNORMAL LOW (ref 12.0–15.0)
Immature Granulocytes: 1 %
Lymphocytes Relative: 24 %
Lymphs Abs: 2.9 10*3/uL (ref 0.7–4.0)
MCH: 32.8 pg (ref 26.0–34.0)
MCHC: 33.1 g/dL (ref 30.0–36.0)
MCV: 98.9 fL (ref 80.0–100.0)
Monocytes Absolute: 1 10*3/uL (ref 0.1–1.0)
Monocytes Relative: 8 %
Neutro Abs: 8.1 10*3/uL — ABNORMAL HIGH (ref 1.7–7.7)
Neutrophils Relative %: 66 %
Platelets: 111 10*3/uL — ABNORMAL LOW (ref 150–400)
RBC: 3.63 MIL/uL — ABNORMAL LOW (ref 3.87–5.11)
RDW: 14.3 % (ref 11.5–15.5)
WBC: 12.2 10*3/uL — ABNORMAL HIGH (ref 4.0–10.5)
nRBC: 0 % (ref 0.0–0.2)

## 2022-04-29 LAB — FERRITIN: Ferritin: 430 ng/mL — ABNORMAL HIGH (ref 11–307)

## 2022-04-29 LAB — IRON AND TIBC
Iron: 42 ug/dL (ref 28–170)
Saturation Ratios: 32 % — ABNORMAL HIGH (ref 10.4–31.8)
TIBC: 132 ug/dL — ABNORMAL LOW (ref 250–450)
UIBC: 90 ug/dL

## 2022-04-29 LAB — PHOSPHORUS: Phosphorus: 2.2 mg/dL — ABNORMAL LOW (ref 2.5–4.6)

## 2022-04-29 LAB — GLUCOSE, CAPILLARY
Glucose-Capillary: 102 mg/dL — ABNORMAL HIGH (ref 70–99)
Glucose-Capillary: 65 mg/dL — ABNORMAL LOW (ref 70–99)
Glucose-Capillary: 67 mg/dL — ABNORMAL LOW (ref 70–99)
Glucose-Capillary: 84 mg/dL (ref 70–99)
Glucose-Capillary: 108 mg/dL — ABNORMAL HIGH (ref 70–99)
Glucose-Capillary: 134 mg/dL — ABNORMAL HIGH (ref 70–99)
Glucose-Capillary: 145 mg/dL — ABNORMAL HIGH (ref 70–99)

## 2022-04-29 LAB — FOLATE: Folate: 3.8 ng/mL — ABNORMAL LOW (ref >5.9)

## 2022-04-29 LAB — MAGNESIUM: Magnesium: 2 mg/dL (ref 1.7–2.4)

## 2022-04-29 LAB — VITAMIN B12: Vitamin B-12: 560 pg/mL (ref 180–914)

## 2022-04-29 MED ORDER — GLUCOSE 40 % PO GEL
ORAL | Status: AC
  Filled 2022-04-29: qty 1.21

## 2022-04-29 MED ORDER — GLUCOSE 40 % PO GEL
1.0000 | ORAL | Status: AC
Start: 2022-04-29 — End: 2022-04-29
  Administered 2022-04-29: 31 g via ORAL

## 2022-04-29 MED ORDER — DEXTROSE 5 % IV SOLN
20.0000 mmol | Freq: Once | INTRAVENOUS | Status: DC
  Filled 2022-04-29: qty 6.67

## 2022-04-29 MED ORDER — ACETAMINOPHEN 325 MG PO TABS: 650.0000 mg | ORAL_TABLET | Freq: Four times a day (QID) | ORAL | 0 refills | Status: AC | PRN

## 2022-04-29 MED ORDER — ACETAMINOPHEN 325 MG PO TABS
650.0000 mg | ORAL_TABLET | Freq: Four times a day (QID) | ORAL | Status: DC | PRN
Start: 2022-04-29 — End: 2022-04-29
  Administered 2022-04-29: 650 mg via ORAL
  Filled 2022-04-29: qty 2

## 2022-04-29 MED ORDER — PANTOPRAZOLE SODIUM 40 MG PO TBEC: 40.0000 mg | DELAYED_RELEASE_TABLET | Freq: Every day | ORAL | 0 refills | Status: AC

## 2022-04-29 MED ORDER — ONDANSETRON HCL 4 MG PO TABS: 4.0000 mg | ORAL_TABLET | Freq: Four times a day (QID) | ORAL | 0 refills | Status: AC | PRN

## 2022-04-29 MED ORDER — K PHOS MONO-SOD PHOS DI & MONO 155-852-130 MG PO TABS
500.0000 mg | ORAL_TABLET | Freq: Two times a day (BID) | ORAL | Status: DC
  Filled 2022-04-29: qty 2

## 2022-04-29 NOTE — Discharge Summary (Signed)
Physician Discharge Summary   Patient: [name] MRN: [id number] DOB: Sep 13, 1941  Admit date:     04/21/2022  Discharge date: 04/29/22  Discharge Physician: [name]   PCP: [name], MD   Recommendations at discharge:   Follow-up with PCP within 1 to 2 weeks and repeat CBC, CMP, mag, Phos within 1 week Follow-up with Gastroenterology in outpatient setting Continue with palliative care follow-up at SNF  Discharge Diagnoses: Principal Problem:   Obstructive jaundice Active Problems:   Pancreatic mass   Acute encephalopathy   Dementia without behavioral disturbance (HCC)   HTN (hypertension)   Elevated LFTs  Resolved Problems:   * No resolved hospital problems. [name] Course: The patient is an 81 year old female with a history of hypertension, hyperlipidemia, dementia who lives at home with her husband, brought to the hospital with worsening mental status.  She initially presented to [facility] where she was noted to have elevated LFTs.  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  Due to lack of GI coverage, she was transferred to [facility] for further work-up.  GI following now and took the patient for EGD and Biopsies were taken and EGD also showed gastritis.  GI speaking with the patient's family and consideration for an ERCP and family and they are agreeable so she underwent ERCP 04/25/22 by Dr. Clarene Essex and had a stent placed in the biliary sphincterotomy.  Her LFTs are improving and she is medically stable for discharge and palliative care to follow-up in the outpatient setting.  Assessment and Plan:  Concern for pancreatic head versus ampullary mass status post stent placed for obstructive jaundice Elevated liver function tests/Abnormal LFT's, improving Obstructive Jaundice with Hyperbilirubinemia status post ERCP with metal stent placed, improving  -Noted at outside hospital to have elevated liver enzymes with  concern for pancreatic mass versus a ampullary mass given no GI availability at [place] is transferred from [facility] for further evaluation -Currently n.p.o. for EGD -CT abdomen done at [facility] showed dilated biliary and pancreatic ducts with possible pancreatic head mass versus ampullary mass -GI following and plan is for EGD today -Unable to do MRI since patient has spinal stimulator in place -Continue to Hold statin -AST went from 444 -> 461 -> 388 -> 318 -> 182 -> 85 -> 50 -> 31 and ALT went from 212 -> 219 -> 207 -> 181 -> 157 -> 116 -> 91 -> 73 -T Bili went from 3.1 -> 3.7 -> 4.2 -> 4.9 -> 2.4 -> 2.1 x2 -> 1.8 and now finally improving  -CEA was 3.1, AFP serum tumor marker was 2.7 but CA 19-9 was significantly elevated at 130 -Further Care per GI: She underwent an EGD which showed bulging at the area of the ampulla from the pancreatic mass pushing towards the ampulla of the duodenum with no discrete mass seen.  Biopsies were performed and EGD also showed gastritis so she is placed on IV pantoprazole.  GI recommending following the biopsies and discussed the case further and they feel the EUS is not indicated at this time and the family is not interested in surgical options but they are recommending considering ERCP for further evaluation. -The GI team is discussing ERCP with the patient and patient's family and they are going to discuss with rest of family and talk over but she has a spot held for an ERCP tomorrow if they are agreeable; patient underwent ERCP today and showed that the major  papilla was slightly bulging and deep selective cannulation was will be obtained first attempt. GI did a biliary sphincterotomy that was performed and cells for cytology were obtained in the lower third of the main duct and a covered metal stent was placed into the common bile duct. -GI recommending a clear liquid diet for 6 hours and then advance diet as tolerated slowly; now she is tolerating a  soft diet -GI recommending awaiting cytology results and following up with the GI clinic as needed -Consulted Palliative Care for further GOC Discussion; see below -PT OT recommending SNF and she is medically stable to be discharged  Dementia without behavioral disturbance with superimposed Acute Encephalopathy -Dr. Roderic Palau Discussed with patient's son and he describes that patient has fairly advanced dementia -He reports that she has been frequently sundowning at home in the last several months -She is also had worsening confusion during the day -He has noted progressive decline -Ammonia Level was 38 and TSH was 3.144 -Analysis was done and showed clear appearance with amber-colored urine, small hemoglobin, 20 ketones, large leukocytes, negative nitrites, pH 6.0, rare bacteria, 0-5 RBCs per high-power field, 0-5 squamous epithelial cells, 21-50 WBCs -Delirium Precautions    Hypoglycemia -No was longer n.p.o. and so we will stop IV fluid hydration -She was Started on dextrose infusion and now CBG's ranging from 65-134;  Was getting D5 half-normal saline at 75 MLS per hour and now that she is tolerating Soft Diet will stop IVF  -Continue to Monitor for S/Sx of Hypoglycemia    Hypertension -Chronically on atenolol -Holding for now since she has had some episodes of bradycardia and is also currently normotensive -Continue to monitor blood pressures per protocol; last blood pressure reading was now slightly elevated at 103/58   Hyperlipidemia -Holding statin in light of elevated LFTs   Normocytic Anemia -Hgb/Hct went from 12.7/38.3 -> 12.6/37.5 -> 11.6/34.1 -> 11.2/33.4 -> 10.7/32.0 -> 11.8/34.8 -> 11.3/34.5 -> 11.9/35.8 -Check Anemia Panel in the AM -Continue to Monitor for S/Sx of Bleeding; No overt bleeding noted -Repeat CBC in the AM    Hypokalemia -Patient's Phos Level is now gone from 4.8 -> 3.5 -> 3.4 -> 3.5 -> 4.9 -> 3.5 -Replete with po K-Phos Neutral 500 g p.o. twice daily x2  doses  -Continue to Monitor and Replete as Necessary -Repeat CMP in the AM    Hypophosphatemia -His phosphorus level is now 2.2 -Replete with po K Phos Neutral 500 mg po BID x2 -Continue to Monitor and Replete as Necessary -Repeat Phos Level in the AM    Metabolic Acidosis, improved -Patient CO2 is now 24, anion gap is 6, chloride level 105 -Getting IV fluid hydration with D5 normal saline at 75 MLS per hour -Continue to monitor and trend and repeat CMP in a.m.   Goals of Care -Dr. Roderic Palau tried to initiate goals of care conversation with patient's son on 5/29.  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 -We will likely need to reengage with palliative care to help address goals of care once further information regarding her biliary process becomes available and Palliative Care has been consulted and will await decision for ERCP if family is agreeable and if they consent ERCP -ERCP is done and stent has been placed and palliative  is discussed with the family regarding neck steps and family reports that she would not be able to be cared for at home in her current state and alert trial rehab control and they want her to go to rehab to become a little bit more functional prior to returning home as they would not be able to take care of her encouraged to even with hospice services -Plan is to go to SNF   Hypoalbuminemia -Patient's albumin level is now 2.1 x2 -> 2.0 -> 2.2 -Continue monitor and trend and repeat CMP within 1 week.   Leukocytosis -Mild and likely reactive -WBC went from 5.7 -> 6.5 -> 7.5 -> 10.5 -> 14.1 -> 12.2 -Continue to Monitor for S/Sx of Infection -Repeat CBC within 1 week     Thrombocytopenia -Patient's platelet count went from 159 and trended down to 127 -> 145 -> 125 -> 81 -> 111 -Continue to monitor for signs and symptoms of bleeding; no overt bleeding noted -Repeat CBC with 1 week   Bilateral lower extremity wounds, improving -WOC nurse consulted and recommending apply Eucerin cream bilaterally after bathing and roughly towel drying to assist with removal of loose dry skin  Active Pressure Injury/Wound(s)     Pressure Ulcer  Duration          Pressure Injury 04/21/22 Toe (Comment  which one) Anterior;Left deep purple 2nd toe 8 days           Consultants: Gastroenterology;  Procedures performed:  EGD Findings:      The Z-line was regular and was found 37 cm from the incisors.      Diffuse moderate inflammation characterized by congestion (edema),       erosions and erythema was found in the entire examined stomach. Biopsies       were taken with a cold forceps for histology.      The cardia and gastric fundus were normal on retroflexion.      Area of ampulla appeared bulging likely from pancreatic mass pushing       towards Ampulla and duodenum. This area was evaluated with side-viewing       scope. Biopsies performed. Impression:               - Z-line regular, 37 cm from the incisors.                           - Chronic gastritis. Biopsied. Moderate Sedation:      Moderate (conscious) sedation was personally administered by an       anesthesia professional. The following parameters were monitored: oxygen       saturation, heart rate, blood pressure, and response to care. Recommendation:           - Return patient to hospital ward for ongoing care.                           - Resume previous diet.                           - Continue present medications.   ERCP Findings:      The major papilla was slightly bulging. Deep selective cannulation was       readily obtained on the first attempt and an obvious distal stricture       was seen  in the CBD was dilated above the  stricture and we proceeded       with a biliary sphincterotomy was made with a Hydratome sphincterotome       using ERBE electrocautery. There was no post-sphincterotomy bleeding. We       proceeded until we had adequate biliary drainage and could easily get       the sphincterotome in and out of the duct and cells for cytology were       obtained by brushing in the lower third of the main bile duct mostly of       the stricture in the customary fashion. One 10 Fr by 6 cm covered metal       stent with no external flaps and no internal flaps was placed 5 cm into       the common bile duct. Bile flowed through the stent. The stent was in       good position. The wire and introducer were removed and the scope was       removed and there was no pancreatic duct injection or wire advancement       throughout the procedure and the patient tolerated the procedure well       and there was no obvious immediate complication Impression:               - The major papilla appeared to be slightly bulging.                           - A biliary sphincterotomy was performed.                           - Cells for cytology obtained in the lower third of                            the main duct.                           - One covered metal stent was placed into the                            common bile duct. Moderate Sedation:      Not Applicable - Patient had care per Anesthesia. Recommendation:           - Clear liquid diet for 6 hours. If doing well this                            evening may slowly advance to soft diet and slowly                            advance tomorrow                           - Continue present medications.                           - Check liver enzymes (AST, ALT, alkaline                            phosphatase, bilirubin) tomorrow. And follow back  to normal as an outpatient and her primary doctor                             could do this                           - Await cytology results. Consider oncology consult                            for possible chemo or radiation                           - Return to GI clinic PRN.                           - Telephone GI clinic for pathology results in 1                            week.                           - Telephone GI clinic if symptomatic PRN.   Disposition: Skilled nursing facility Diet recommendation:  Dysphagia type 3 Thin Liquid  DISCHARGE MEDICATION: Allergies as of 04/29/2022       Reactions   Naproxen Sodium Other (See Comments)   GI upset   Percocet [oxycodone-acetaminophen] Nausea And Vomiting, Other (See Comments)   hyperactivity        Medication List     STOP taking these medications    atenolol 100 MG tablet Commonly known as: TENORMIN   bismuth subsalicylate 676 PP/50DT suspension Commonly known as: PEPTO BISMOL   latanoprost 0.005 % ophthalmic solution Commonly known as: XALATAN   PARoxetine 40 MG tablet Commonly known as: PAXIL   pravastatin 40 MG tablet Commonly known as: PRAVACHOL   rOPINIRole 4 MG tablet Commonly known as: REQUIP       TAKE these medications    acetaminophen 325 MG tablet Commonly known as: TYLENOL Take 2 tablets (650 mg total) by mouth every 6 (six) hours as needed for mild pain or headache. What changed:  medication strength how much to take reasons to take this   ondansetron 4 MG tablet Commonly known as: ZOFRAN Take 1 tablet (4 mg total) by mouth every 6 (six) hours as needed for nausea.   pantoprazole 40 MG tablet Commonly known as: PROTONIX Take 1 tablet (40 mg total) by mouth daily. Start taking on: April 30, 2022               Discharge Care Instructions  (From admission, onward)           Start     Ordered   04/29/22 0000  Discharge wound care:       Comments: Wound care  Until discontinued      Comments: 1. Apply Eucerin cream to bilat legs Q day after  bathing and roughly towel drying to assist with removal of loose dried skin 2. Foam dressing to sacrum, change Q 3 days or PRN soiling  04/21/22 0902     04/29/22 1311           Discharge Exam: Filed Weights   04/23/22 1150 04/25/22 1240  Weight: 62  kg 62 kg   Vitals:   04/29/22 0444 04/29/22 1306  BP: 123/78 101/63  Pulse: 84 (!) 43  Resp: 18 20  Temp: 98 F (36.7 C)   SpO2: 91% 99%   Examination: Physical Exam:  Constitutional: Thin chronically ill-appearing Caucasian female currently no acute distress who is pleasantly demented and complaining of some whole body pain and slight headache Respiratory: Diminished to auscultation bilaterally with coarse breath sounds, no wheezing, rales, rhonchi or crackles. Normal respiratory effort and patient is not tachypenic. No accessory muscle use.  Cardiovascular: RRR, no murmurs / rubs / gallops. S1 and S2 auscultated.  Mild 1+ lower extremity edema.  Abdomen: Soft, non-tender, distended secondary body habitus. Bowel sounds positive.  GU: Deferred. Musculoskeletal: No clubbing / cyanosis of digits/nails. No joint deformity upper and lower extremities.  Skin: She has bilateral upper extremity excoriation and erythema from scratching which is improved significantly but she does have some dry squeaky skin Neurologic: CN 2-12 grossly intact with no focal deficits but she is hard of hearing Psychiatric: Impaired judgment and insight.  Appears slightly distressed and pleasantly demented  Condition at discharge: stable  The results of significant diagnostics from this hospitalization (including imaging, microbiology, ancillary and laboratory) are listed below for reference.   Imaging Studies: DG Lumbar Spine 1 View  Result Date: 04/21/2022 CLINICAL DATA:  Evaluate for spinal cord stimulator placement. EXAM: LUMBAR SPINE - 1 VIEW; THORACIC SPINE - 1 VIEW COMPARISON:  Chest two views 08/25/2012; CT chest abdomen and pelvis 04/20/2022  FINDINGS: Single frontal view of the thoracic spine and single frontal view of the lumbar spine. Postsurgical changes are again seen of bilateral transpedicular rod and screw fusion hardware at L2 through S1 with associated L2-3 through L5-S1 intervertebral disc spacers. Mild dextrocurvature of the mid to lower thoracic spine. Moderate multilevel degenerative disc and endplate changes. Partial visualization of cervical spine posterior rod and screw fusion hardware and lower cervical spine ACDF hardware. Apparent plate and screws overlying the lower mandible. A spinal cord stimulator generator pack is seen overlying the left buttock and there is a single lead extending into the left S3 foramen, as on recent CT. There is prior CT IV contrast excretion into the urinary bladder. Moderate bilateral inferior sacroiliac joint space narrowing. Old healed left superior and inferior pubic ramus fractures. 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 IMPRESSION: Left buttock generator pack and single lead nerve stimulator overlying the left hemisacrum as on recent CT that demonstrated the lead tip within the left S3 foramen. Electronically Signed   By: Yvonne Kendall M.D.   On: 04/21/2022 13:10   DG Thoracic Spine 1 View  Result Date: 04/21/2022 CLINICAL DATA:  Evaluate for spinal cord stimulator placement. EXAM: LUMBAR SPINE - 1 VIEW; THORACIC SPINE - 1 VIEW COMPARISON:  Chest two views 08/25/2012; CT chest abdomen and pelvis 04/20/2022 FINDINGS: Single frontal view of the thoracic spine and single frontal view of the lumbar spine. Postsurgical changes are again seen of bilateral transpedicular rod and screw fusion hardware at L2 through S1 with associated L2-3 through L5-S1 intervertebral disc spacers. Mild dextrocurvature of the mid to lower thoracic spine. Moderate multilevel degenerative disc and endplate changes. Partial visualization of cervical spine posterior rod  and screw fusion hardware and lower cervical spine ACDF hardware. Apparent plate and screws overlying the lower mandible. A spinal cord stimulator generator pack is seen overlying the left buttock and there is a single lead extending into  the left S3 foramen, as on recent CT. There is prior CT IV contrast excretion into the urinary bladder. Moderate bilateral inferior sacroiliac joint space narrowing. Old healed left superior and inferior pubic ramus fractures. 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 IMPRESSION: Left buttock generator pack and single lead nerve stimulator overlying the left hemisacrum as on recent CT that demonstrated the lead tip within the left S3 foramen. Electronically Signed   By: Yvonne Kendall M.D.   On: 04/21/2022 13:10   DG ERCP  Result Date: 04/25/2022 CLINICAL DATA:  ERCP with stent placement EXAM: ERCP TECHNIQUE: Multiple spot images obtained with the fluoroscopic device and submitted for interpretation post-procedure. FLUOROSCOPY: Refer to separate report COMPARISON:  None Available. FINDINGS: A total of 2 fluoroscopic spot images taken during ERCP are submitted for review. Initial image demonstrates a scope overlying the upper abdomen with catheterization of the common bile duct. Contrast injection demonstrates what appears to be a dilated common bile duct. There is an abrupt cutoff of the distal CBD. Second image demonstrates a metal biliary stent within the common bile duct. IMPRESSION: ERCP images as described. Refer to procedure report for full details. These images were submitted for radiologic interpretation only. Please see the procedural report for the amount of contrast and the fluoroscopy time utilized. Electronically Signed   By: Albin Felling M.D.   On: 04/25/2022 14:48    Microbiology: Results for orders placed or performed during the hospital encounter of 09/09/12  Body fluid culture     Status: None    Collection Time: 09/09/12 12:12 PM   Specimen: Synovium  Result Value Ref Range Status   Specimen Description SYNOVIAL  Final   Special Requests NONE LEFT SHOULDER  Final   Gram Stain NO WBC SEEN NO ORGANISMS SEEN  Final   Culture NO GROWTH 3 DAYS  Final   Report Status 09/13/2012 FINAL  Final  Anaerobic culture     Status: None   Collection Time: 09/09/12 12:12 PM   Specimen: Synovium  Result Value Ref Range Status   Specimen Description SYNOVIAL  Final   Special Requests NONE LEFT SHOULDER  Final   Gram Stain NO WBC SEEN NO ORGANISMS SEEN  Final   Culture NO ANAEROBES ISOLATED  Final   Report Status 09/14/2012 FINAL  Final    Labs: CBC: Recent Labs  Lab 04/25/22 0343 04/26/22 0415 04/27/22 0347 04/28/22 0744 04/29/22 0412  WBC 6.5 7.5 10.5 14.1* 12.2*  NEUTROABS 3.5 6.0 7.6 9.4* 8.1*  HGB 10.7* 11.8* 11.3* 11.9* 11.9*  HCT 32.0* 34.8* 34.5* 35.8* 35.9*  MCV 97.6 97.5 100.9* 97.8 98.9  PLT 127* 145* 125* 81* 563*   Basic Metabolic Panel: Recent Labs  Lab 04/25/22 0343 04/26/22 0415 04/27/22 0347 04/28/22 0744 04/29/22 0412  NA 140 138 139 137 135  K 3.5 3.4* 3.5 4.9 3.5  CL 114* 111 113* 109 105  CO2 22 22 20* 22 24  GLUCOSE 106* 170* 119* 79 94  BUN 7* '8 10 11 12  '$ CREATININE 0.67 0.73 0.73 0.64 0.71  CALCIUM 7.8* 7.8* 7.8* 7.7* 8.0*  MG 1.7 2.1 2.1 1.7 2.0  PHOS 1.9* 2.5 1.9* 2.7 2.2*   Liver Function Tests: Recent Labs  Lab 04/25/22 0343 04/26/22 0415 04/27/22 0347 04/28/22 0744 04/29/22 0412  AST 318* 182* 85* 50* 31  ALT 181* 157* 116* 91* 73*  ALKPHOS 570* 567* 472* 424* 393*  BILITOT 4.9* 2.4* 2.1* 2.1* 1.8*  PROT 4.7* 5.0* 4.8* 4.7* 5.1*  ALBUMIN 2.0* 2.1* 2.1* 2.0* 2.2*   CBG: Recent Labs  Lab 04/29/22 0733 04/29/22 0756 04/29/22 0818 04/29/22 1006 04/29/22 1129  GLUCAP 65* 67* 84 108* 134*   Discharge time spent: greater than 30 minutes.  Signed: Raiford Noble, DO [facility] 04/29/2022

## 2022-04-29 NOTE — Progress Notes (Signed)
This encounter was created in error - please disregard.

## 2022-04-29 NOTE — Progress Notes (Signed)
Hypoglycemic Event  CBG: 65  Treatment: Attempted apple juice and berry flavored boost.  Pt drank 50-60 mL but kept yelling to leave her alone.  Administered Oral glucose gel per protocol.    Symptoms: none - possible agitation, but difficult to tell since she has agitation at baseline.  Follow-up CBG: Time:  0756 CBG Result: 67 Continued to work with patient to get her to take oral gel and boost/juice    Second follow up: 0818  CBG 84 Pt completed oral glucose gel and had 200 oral intake and ate 5 spoonfuls of ice cream  Possible Reasons for Event: No oral intake overnight  Comments/MD notified: Alfredia Ferguson MD notified    Candie Mile

## 2022-04-29 NOTE — TOC Progression Note (Signed)
Transition of Care [facility]) - Progression Note    Patient Details  Name: [name] MRN: [id number] Date of Birth: 12-16-1940  Transition of Care [facility]) CM/SW Contact  Leeroy Cha, RN Phone Number: 04/29/2022, 11:23 AM  Clinical Narrative:    Tct-son-message left to return call for requested snf .   Expected Discharge Plan: [place] Barriers to Discharge: Continued Medical Work up  Expected Discharge Plan and Services Expected Discharge Plan: [place]   Discharge Planning Services: CM Consult   Living arrangements for the past 2 months: Single Family Home                                       Social Determinants of Health (SDOH) Interventions    Readmission Risk Interventions     View : No data to display.

## 2022-04-29 NOTE — Progress Notes (Signed)
Report called to [facility] rehab/nursing facility.  Pt IV taken out.  Transportation provided by [employer].   Pt dentures sent with her to facility.   Husband called and informed of transport.

## 2022-04-29 NOTE — TOC Progression Note (Addendum)
Transition of Care [facility]) - Progression Note    Patient Details  Name: [name] MRN: [id number] Date of Birth: 21-Sep-1941  Transition of Care [facility]) CM/SW Contact  Leeroy Cha, RN Phone Number: 04/29/2022, 11:24 AM  Clinical Narrative:    Tct-hjusband Charles/ please contact son. Tcf-son family wants [place]. Tct-[facility] -sherry-message left to return call. Tcf-Joe in admission/pt can come to room 102 call report to [phone]. TCT-PTAR INFORMATION GIVEN FOR TRANSPORT TRANSPORT TO THE [facility] Dc summary sent via the hub to [place].  Expected Discharge Plan: [place] Barriers to Discharge: Continued Medical Work up  Expected Discharge Plan and Services Expected Discharge Plan: [place]   Discharge Planning Services: CM Consult   Living arrangements for the past 2 months: Single Family Home                                       Social Determinants of Health (SDOH) Interventions    Readmission Risk Interventions     View : No data to display.

## 2022-05-01 DIAGNOSIS — I10 Essential (primary) hypertension: Secondary | ICD-10-CM | POA: Diagnosis not present

## 2022-05-01 DIAGNOSIS — F0390 Unspecified dementia without behavioral disturbance: Secondary | ICD-10-CM | POA: Diagnosis not present

## 2022-05-01 DIAGNOSIS — C253 Malignant neoplasm of pancreatic duct: Secondary | ICD-10-CM | POA: Diagnosis not present

## 2022-05-01 DIAGNOSIS — E785 Hyperlipidemia, unspecified: Secondary | ICD-10-CM | POA: Diagnosis not present

## 2022-05-05 DIAGNOSIS — I10 Essential (primary) hypertension: Secondary | ICD-10-CM | POA: Diagnosis not present

## 2022-05-05 DIAGNOSIS — C253 Malignant neoplasm of pancreatic duct: Secondary | ICD-10-CM | POA: Diagnosis not present

## 2022-05-05 DIAGNOSIS — D649 Anemia, unspecified: Secondary | ICD-10-CM | POA: Diagnosis not present

## 2022-05-05 DIAGNOSIS — F0390 Unspecified dementia without behavioral disturbance: Secondary | ICD-10-CM | POA: Diagnosis not present

## 2022-05-12 DIAGNOSIS — C253 Malignant neoplasm of pancreatic duct: Secondary | ICD-10-CM | POA: Diagnosis not present

## 2022-05-12 DIAGNOSIS — D696 Thrombocytopenia, unspecified: Secondary | ICD-10-CM | POA: Diagnosis not present

## 2022-05-12 DIAGNOSIS — F0390 Unspecified dementia without behavioral disturbance: Secondary | ICD-10-CM | POA: Diagnosis not present

## 2022-05-12 DIAGNOSIS — I10 Essential (primary) hypertension: Secondary | ICD-10-CM | POA: Diagnosis not present

## 2022-05-16 DIAGNOSIS — F0390 Unspecified dementia without behavioral disturbance: Secondary | ICD-10-CM | POA: Diagnosis not present

## 2022-05-16 DIAGNOSIS — C253 Malignant neoplasm of pancreatic duct: Secondary | ICD-10-CM | POA: Diagnosis not present

## 2022-05-16 DIAGNOSIS — I10 Essential (primary) hypertension: Secondary | ICD-10-CM | POA: Diagnosis not present

## 2022-05-23 DIAGNOSIS — C253 Malignant neoplasm of pancreatic duct: Secondary | ICD-10-CM | POA: Diagnosis not present

## 2022-05-23 DIAGNOSIS — F0390 Unspecified dementia without behavioral disturbance: Secondary | ICD-10-CM | POA: Diagnosis not present

## 2022-05-23 DIAGNOSIS — I10 Essential (primary) hypertension: Secondary | ICD-10-CM | POA: Diagnosis not present

## 2022-05-29 DIAGNOSIS — F0390 Unspecified dementia without behavioral disturbance: Secondary | ICD-10-CM | POA: Diagnosis not present

## 2022-05-29 DIAGNOSIS — C253 Malignant neoplasm of pancreatic duct: Secondary | ICD-10-CM | POA: Diagnosis not present

## 2022-05-29 DIAGNOSIS — I10 Essential (primary) hypertension: Secondary | ICD-10-CM | POA: Diagnosis not present

## 2022-05-29 DIAGNOSIS — E559 Vitamin D deficiency, unspecified: Secondary | ICD-10-CM | POA: Diagnosis not present

## 2022-06-06 DIAGNOSIS — E559 Vitamin D deficiency, unspecified: Secondary | ICD-10-CM | POA: Diagnosis not present

## 2022-06-18 DIAGNOSIS — L89890 Pressure ulcer of other site, unstageable: Secondary | ICD-10-CM | POA: Diagnosis not present

## 2022-06-18 DIAGNOSIS — R1312 Dysphagia, oropharyngeal phase: Secondary | ICD-10-CM | POA: Diagnosis not present

## 2022-06-18 DIAGNOSIS — R262 Difficulty in walking, not elsewhere classified: Secondary | ICD-10-CM | POA: Diagnosis not present

## 2022-06-18 DIAGNOSIS — K219 Gastro-esophageal reflux disease without esophagitis: Secondary | ICD-10-CM | POA: Diagnosis not present

## 2022-06-18 DIAGNOSIS — F0390 Unspecified dementia without behavioral disturbance: Secondary | ICD-10-CM | POA: Diagnosis not present

## 2022-06-18 DIAGNOSIS — M6259 Muscle wasting and atrophy, not elsewhere classified, multiple sites: Secondary | ICD-10-CM | POA: Diagnosis not present

## 2022-06-18 DIAGNOSIS — I10 Essential (primary) hypertension: Secondary | ICD-10-CM | POA: Diagnosis not present

## 2022-06-18 DIAGNOSIS — K2970 Gastritis, unspecified, without bleeding: Secondary | ICD-10-CM | POA: Diagnosis not present

## 2022-06-18 DIAGNOSIS — G9340 Encephalopathy, unspecified: Secondary | ICD-10-CM | POA: Diagnosis not present

## 2022-06-18 DIAGNOSIS — C253 Malignant neoplasm of pancreatic duct: Secondary | ICD-10-CM | POA: Diagnosis not present

## 2022-06-18 DIAGNOSIS — K869 Disease of pancreas, unspecified: Secondary | ICD-10-CM | POA: Diagnosis not present

## 2022-06-18 DIAGNOSIS — H409 Unspecified glaucoma: Secondary | ICD-10-CM | POA: Diagnosis not present

## 2022-06-18 DIAGNOSIS — D509 Iron deficiency anemia, unspecified: Secondary | ICD-10-CM | POA: Diagnosis not present

## 2022-06-18 DIAGNOSIS — R41841 Cognitive communication deficit: Secondary | ICD-10-CM | POA: Diagnosis not present

## 2022-06-18 DIAGNOSIS — E785 Hyperlipidemia, unspecified: Secondary | ICD-10-CM | POA: Diagnosis not present

## 2022-06-18 DIAGNOSIS — R278 Other lack of coordination: Secondary | ICD-10-CM | POA: Diagnosis not present

## 2022-06-18 DIAGNOSIS — H259 Unspecified age-related cataract: Secondary | ICD-10-CM | POA: Diagnosis not present

## 2022-06-19 DIAGNOSIS — R278 Other lack of coordination: Secondary | ICD-10-CM | POA: Diagnosis not present

## 2022-06-19 DIAGNOSIS — F0390 Unspecified dementia without behavioral disturbance: Secondary | ICD-10-CM | POA: Diagnosis not present

## 2022-06-19 DIAGNOSIS — R262 Difficulty in walking, not elsewhere classified: Secondary | ICD-10-CM | POA: Diagnosis not present

## 2022-06-19 DIAGNOSIS — G9340 Encephalopathy, unspecified: Secondary | ICD-10-CM | POA: Diagnosis not present

## 2022-06-19 DIAGNOSIS — M6259 Muscle wasting and atrophy, not elsewhere classified, multiple sites: Secondary | ICD-10-CM | POA: Diagnosis not present

## 2022-06-19 DIAGNOSIS — R1312 Dysphagia, oropharyngeal phase: Secondary | ICD-10-CM | POA: Diagnosis not present

## 2022-06-20 DIAGNOSIS — R278 Other lack of coordination: Secondary | ICD-10-CM | POA: Diagnosis not present

## 2022-06-20 DIAGNOSIS — R262 Difficulty in walking, not elsewhere classified: Secondary | ICD-10-CM | POA: Diagnosis not present

## 2022-06-20 DIAGNOSIS — M6259 Muscle wasting and atrophy, not elsewhere classified, multiple sites: Secondary | ICD-10-CM | POA: Diagnosis not present

## 2022-06-20 DIAGNOSIS — R1312 Dysphagia, oropharyngeal phase: Secondary | ICD-10-CM | POA: Diagnosis not present

## 2022-06-20 DIAGNOSIS — G9340 Encephalopathy, unspecified: Secondary | ICD-10-CM | POA: Diagnosis not present

## 2022-06-20 DIAGNOSIS — F0390 Unspecified dementia without behavioral disturbance: Secondary | ICD-10-CM | POA: Diagnosis not present

## 2022-06-23 DIAGNOSIS — G9340 Encephalopathy, unspecified: Secondary | ICD-10-CM | POA: Diagnosis not present

## 2022-06-23 DIAGNOSIS — R278 Other lack of coordination: Secondary | ICD-10-CM | POA: Diagnosis not present

## 2022-06-23 DIAGNOSIS — R262 Difficulty in walking, not elsewhere classified: Secondary | ICD-10-CM | POA: Diagnosis not present

## 2022-06-23 DIAGNOSIS — R1312 Dysphagia, oropharyngeal phase: Secondary | ICD-10-CM | POA: Diagnosis not present

## 2022-06-23 DIAGNOSIS — F0390 Unspecified dementia without behavioral disturbance: Secondary | ICD-10-CM | POA: Diagnosis not present

## 2022-06-23 DIAGNOSIS — M6259 Muscle wasting and atrophy, not elsewhere classified, multiple sites: Secondary | ICD-10-CM | POA: Diagnosis not present

## 2022-06-24 DIAGNOSIS — H409 Unspecified glaucoma: Secondary | ICD-10-CM | POA: Diagnosis not present

## 2022-06-24 DIAGNOSIS — C253 Malignant neoplasm of pancreatic duct: Secondary | ICD-10-CM | POA: Diagnosis not present

## 2022-06-24 DIAGNOSIS — R41841 Cognitive communication deficit: Secondary | ICD-10-CM | POA: Diagnosis not present

## 2022-06-24 DIAGNOSIS — L89890 Pressure ulcer of other site, unstageable: Secondary | ICD-10-CM | POA: Diagnosis not present

## 2022-06-24 DIAGNOSIS — K2970 Gastritis, unspecified, without bleeding: Secondary | ICD-10-CM | POA: Diagnosis not present

## 2022-06-24 DIAGNOSIS — D509 Iron deficiency anemia, unspecified: Secondary | ICD-10-CM | POA: Diagnosis not present

## 2022-06-24 DIAGNOSIS — R1312 Dysphagia, oropharyngeal phase: Secondary | ICD-10-CM | POA: Diagnosis not present

## 2022-06-24 DIAGNOSIS — R278 Other lack of coordination: Secondary | ICD-10-CM | POA: Diagnosis not present

## 2022-06-24 DIAGNOSIS — M6259 Muscle wasting and atrophy, not elsewhere classified, multiple sites: Secondary | ICD-10-CM | POA: Diagnosis not present

## 2022-06-24 DIAGNOSIS — K869 Disease of pancreas, unspecified: Secondary | ICD-10-CM | POA: Diagnosis not present

## 2022-06-24 DIAGNOSIS — K219 Gastro-esophageal reflux disease without esophagitis: Secondary | ICD-10-CM | POA: Diagnosis not present

## 2022-06-24 DIAGNOSIS — E785 Hyperlipidemia, unspecified: Secondary | ICD-10-CM | POA: Diagnosis not present

## 2022-06-24 DIAGNOSIS — G9340 Encephalopathy, unspecified: Secondary | ICD-10-CM | POA: Diagnosis not present

## 2022-06-24 DIAGNOSIS — I10 Essential (primary) hypertension: Secondary | ICD-10-CM | POA: Diagnosis not present

## 2022-06-24 DIAGNOSIS — R262 Difficulty in walking, not elsewhere classified: Secondary | ICD-10-CM | POA: Diagnosis not present

## 2022-06-24 DIAGNOSIS — H259 Unspecified age-related cataract: Secondary | ICD-10-CM | POA: Diagnosis not present

## 2022-06-24 DIAGNOSIS — F0390 Unspecified dementia without behavioral disturbance: Secondary | ICD-10-CM | POA: Diagnosis not present

## 2022-06-25 DIAGNOSIS — G9340 Encephalopathy, unspecified: Secondary | ICD-10-CM | POA: Diagnosis not present

## 2022-06-25 DIAGNOSIS — R262 Difficulty in walking, not elsewhere classified: Secondary | ICD-10-CM | POA: Diagnosis not present

## 2022-06-25 DIAGNOSIS — R278 Other lack of coordination: Secondary | ICD-10-CM | POA: Diagnosis not present

## 2022-06-25 DIAGNOSIS — F0390 Unspecified dementia without behavioral disturbance: Secondary | ICD-10-CM | POA: Diagnosis not present

## 2022-06-25 DIAGNOSIS — M6259 Muscle wasting and atrophy, not elsewhere classified, multiple sites: Secondary | ICD-10-CM | POA: Diagnosis not present

## 2022-06-25 DIAGNOSIS — R1312 Dysphagia, oropharyngeal phase: Secondary | ICD-10-CM | POA: Diagnosis not present

## 2022-06-26 DIAGNOSIS — R278 Other lack of coordination: Secondary | ICD-10-CM | POA: Diagnosis not present

## 2022-06-26 DIAGNOSIS — M6259 Muscle wasting and atrophy, not elsewhere classified, multiple sites: Secondary | ICD-10-CM | POA: Diagnosis not present

## 2022-06-26 DIAGNOSIS — R1312 Dysphagia, oropharyngeal phase: Secondary | ICD-10-CM | POA: Diagnosis not present

## 2022-06-26 DIAGNOSIS — R262 Difficulty in walking, not elsewhere classified: Secondary | ICD-10-CM | POA: Diagnosis not present

## 2022-06-26 DIAGNOSIS — G9340 Encephalopathy, unspecified: Secondary | ICD-10-CM | POA: Diagnosis not present

## 2022-06-26 DIAGNOSIS — F0390 Unspecified dementia without behavioral disturbance: Secondary | ICD-10-CM | POA: Diagnosis not present

## 2022-06-27 DIAGNOSIS — F0390 Unspecified dementia without behavioral disturbance: Secondary | ICD-10-CM | POA: Diagnosis not present

## 2022-06-27 DIAGNOSIS — M6259 Muscle wasting and atrophy, not elsewhere classified, multiple sites: Secondary | ICD-10-CM | POA: Diagnosis not present

## 2022-06-27 DIAGNOSIS — R1312 Dysphagia, oropharyngeal phase: Secondary | ICD-10-CM | POA: Diagnosis not present

## 2022-06-27 DIAGNOSIS — G9340 Encephalopathy, unspecified: Secondary | ICD-10-CM | POA: Diagnosis not present

## 2022-06-27 DIAGNOSIS — R262 Difficulty in walking, not elsewhere classified: Secondary | ICD-10-CM | POA: Diagnosis not present

## 2022-06-27 DIAGNOSIS — R278 Other lack of coordination: Secondary | ICD-10-CM | POA: Diagnosis not present

## 2022-06-28 DIAGNOSIS — M6259 Muscle wasting and atrophy, not elsewhere classified, multiple sites: Secondary | ICD-10-CM | POA: Diagnosis not present

## 2022-06-28 DIAGNOSIS — F0390 Unspecified dementia without behavioral disturbance: Secondary | ICD-10-CM | POA: Diagnosis not present

## 2022-06-28 DIAGNOSIS — R278 Other lack of coordination: Secondary | ICD-10-CM | POA: Diagnosis not present

## 2022-06-28 DIAGNOSIS — G9340 Encephalopathy, unspecified: Secondary | ICD-10-CM | POA: Diagnosis not present

## 2022-06-28 DIAGNOSIS — R262 Difficulty in walking, not elsewhere classified: Secondary | ICD-10-CM | POA: Diagnosis not present

## 2022-06-28 DIAGNOSIS — R1312 Dysphagia, oropharyngeal phase: Secondary | ICD-10-CM | POA: Diagnosis not present

## 2022-06-30 DIAGNOSIS — G9340 Encephalopathy, unspecified: Secondary | ICD-10-CM | POA: Diagnosis not present

## 2022-06-30 DIAGNOSIS — F0390 Unspecified dementia without behavioral disturbance: Secondary | ICD-10-CM | POA: Diagnosis not present

## 2022-06-30 DIAGNOSIS — R262 Difficulty in walking, not elsewhere classified: Secondary | ICD-10-CM | POA: Diagnosis not present

## 2022-06-30 DIAGNOSIS — R1312 Dysphagia, oropharyngeal phase: Secondary | ICD-10-CM | POA: Diagnosis not present

## 2022-06-30 DIAGNOSIS — M6259 Muscle wasting and atrophy, not elsewhere classified, multiple sites: Secondary | ICD-10-CM | POA: Diagnosis not present

## 2022-06-30 DIAGNOSIS — R278 Other lack of coordination: Secondary | ICD-10-CM | POA: Diagnosis not present

## 2022-07-01 DIAGNOSIS — F0390 Unspecified dementia without behavioral disturbance: Secondary | ICD-10-CM | POA: Diagnosis not present

## 2022-07-01 DIAGNOSIS — R278 Other lack of coordination: Secondary | ICD-10-CM | POA: Diagnosis not present

## 2022-07-01 DIAGNOSIS — M6259 Muscle wasting and atrophy, not elsewhere classified, multiple sites: Secondary | ICD-10-CM | POA: Diagnosis not present

## 2022-07-01 DIAGNOSIS — E559 Vitamin D deficiency, unspecified: Secondary | ICD-10-CM | POA: Diagnosis not present

## 2022-07-01 DIAGNOSIS — G9340 Encephalopathy, unspecified: Secondary | ICD-10-CM | POA: Diagnosis not present

## 2022-07-01 DIAGNOSIS — R1312 Dysphagia, oropharyngeal phase: Secondary | ICD-10-CM | POA: Diagnosis not present

## 2022-07-01 DIAGNOSIS — R262 Difficulty in walking, not elsewhere classified: Secondary | ICD-10-CM | POA: Diagnosis not present

## 2022-07-02 DIAGNOSIS — R1312 Dysphagia, oropharyngeal phase: Secondary | ICD-10-CM | POA: Diagnosis not present

## 2022-07-02 DIAGNOSIS — M6259 Muscle wasting and atrophy, not elsewhere classified, multiple sites: Secondary | ICD-10-CM | POA: Diagnosis not present

## 2022-07-02 DIAGNOSIS — G9340 Encephalopathy, unspecified: Secondary | ICD-10-CM | POA: Diagnosis not present

## 2022-07-02 DIAGNOSIS — R262 Difficulty in walking, not elsewhere classified: Secondary | ICD-10-CM | POA: Diagnosis not present

## 2022-07-02 DIAGNOSIS — R278 Other lack of coordination: Secondary | ICD-10-CM | POA: Diagnosis not present

## 2022-07-02 DIAGNOSIS — F0390 Unspecified dementia without behavioral disturbance: Secondary | ICD-10-CM | POA: Diagnosis not present

## 2022-07-03 DIAGNOSIS — G9340 Encephalopathy, unspecified: Secondary | ICD-10-CM | POA: Diagnosis not present

## 2022-07-03 DIAGNOSIS — F0390 Unspecified dementia without behavioral disturbance: Secondary | ICD-10-CM | POA: Diagnosis not present

## 2022-07-03 DIAGNOSIS — M6259 Muscle wasting and atrophy, not elsewhere classified, multiple sites: Secondary | ICD-10-CM | POA: Diagnosis not present

## 2022-07-03 DIAGNOSIS — R262 Difficulty in walking, not elsewhere classified: Secondary | ICD-10-CM | POA: Diagnosis not present

## 2022-07-03 DIAGNOSIS — R1312 Dysphagia, oropharyngeal phase: Secondary | ICD-10-CM | POA: Diagnosis not present

## 2022-07-03 DIAGNOSIS — R278 Other lack of coordination: Secondary | ICD-10-CM | POA: Diagnosis not present

## 2022-07-07 DIAGNOSIS — F0390 Unspecified dementia without behavioral disturbance: Secondary | ICD-10-CM | POA: Diagnosis not present

## 2022-07-07 DIAGNOSIS — R262 Difficulty in walking, not elsewhere classified: Secondary | ICD-10-CM | POA: Diagnosis not present

## 2022-07-07 DIAGNOSIS — R1312 Dysphagia, oropharyngeal phase: Secondary | ICD-10-CM | POA: Diagnosis not present

## 2022-07-07 DIAGNOSIS — G9340 Encephalopathy, unspecified: Secondary | ICD-10-CM | POA: Diagnosis not present

## 2022-07-07 DIAGNOSIS — M6259 Muscle wasting and atrophy, not elsewhere classified, multiple sites: Secondary | ICD-10-CM | POA: Diagnosis not present

## 2022-07-07 DIAGNOSIS — R278 Other lack of coordination: Secondary | ICD-10-CM | POA: Diagnosis not present

## 2022-07-08 DIAGNOSIS — R1312 Dysphagia, oropharyngeal phase: Secondary | ICD-10-CM | POA: Diagnosis not present

## 2022-07-08 DIAGNOSIS — R278 Other lack of coordination: Secondary | ICD-10-CM | POA: Diagnosis not present

## 2022-07-08 DIAGNOSIS — R262 Difficulty in walking, not elsewhere classified: Secondary | ICD-10-CM | POA: Diagnosis not present

## 2022-07-08 DIAGNOSIS — G9340 Encephalopathy, unspecified: Secondary | ICD-10-CM | POA: Diagnosis not present

## 2022-07-08 DIAGNOSIS — F0390 Unspecified dementia without behavioral disturbance: Secondary | ICD-10-CM | POA: Diagnosis not present

## 2022-07-08 DIAGNOSIS — M6259 Muscle wasting and atrophy, not elsewhere classified, multiple sites: Secondary | ICD-10-CM | POA: Diagnosis not present

## 2022-07-09 DIAGNOSIS — F0390 Unspecified dementia without behavioral disturbance: Secondary | ICD-10-CM | POA: Diagnosis not present

## 2022-07-09 DIAGNOSIS — R1312 Dysphagia, oropharyngeal phase: Secondary | ICD-10-CM | POA: Diagnosis not present

## 2022-07-09 DIAGNOSIS — R262 Difficulty in walking, not elsewhere classified: Secondary | ICD-10-CM | POA: Diagnosis not present

## 2022-07-09 DIAGNOSIS — R278 Other lack of coordination: Secondary | ICD-10-CM | POA: Diagnosis not present

## 2022-07-09 DIAGNOSIS — M6259 Muscle wasting and atrophy, not elsewhere classified, multiple sites: Secondary | ICD-10-CM | POA: Diagnosis not present

## 2022-07-09 DIAGNOSIS — G9340 Encephalopathy, unspecified: Secondary | ICD-10-CM | POA: Diagnosis not present

## 2022-07-10 DIAGNOSIS — R1312 Dysphagia, oropharyngeal phase: Secondary | ICD-10-CM | POA: Diagnosis not present

## 2022-07-10 DIAGNOSIS — E559 Vitamin D deficiency, unspecified: Secondary | ICD-10-CM | POA: Diagnosis not present

## 2022-07-10 DIAGNOSIS — M6259 Muscle wasting and atrophy, not elsewhere classified, multiple sites: Secondary | ICD-10-CM | POA: Diagnosis not present

## 2022-07-10 DIAGNOSIS — R262 Difficulty in walking, not elsewhere classified: Secondary | ICD-10-CM | POA: Diagnosis not present

## 2022-07-10 DIAGNOSIS — E43 Unspecified severe protein-calorie malnutrition: Secondary | ICD-10-CM | POA: Diagnosis not present

## 2022-07-10 DIAGNOSIS — F0390 Unspecified dementia without behavioral disturbance: Secondary | ICD-10-CM | POA: Diagnosis not present

## 2022-07-10 DIAGNOSIS — R278 Other lack of coordination: Secondary | ICD-10-CM | POA: Diagnosis not present

## 2022-07-10 DIAGNOSIS — G9340 Encephalopathy, unspecified: Secondary | ICD-10-CM | POA: Diagnosis not present

## 2022-07-10 DIAGNOSIS — C253 Malignant neoplasm of pancreatic duct: Secondary | ICD-10-CM | POA: Diagnosis not present

## 2022-07-11 DIAGNOSIS — R278 Other lack of coordination: Secondary | ICD-10-CM | POA: Diagnosis not present

## 2022-07-11 DIAGNOSIS — R1312 Dysphagia, oropharyngeal phase: Secondary | ICD-10-CM | POA: Diagnosis not present

## 2022-07-11 DIAGNOSIS — R262 Difficulty in walking, not elsewhere classified: Secondary | ICD-10-CM | POA: Diagnosis not present

## 2022-07-11 DIAGNOSIS — G9340 Encephalopathy, unspecified: Secondary | ICD-10-CM | POA: Diagnosis not present

## 2022-07-11 DIAGNOSIS — M6259 Muscle wasting and atrophy, not elsewhere classified, multiple sites: Secondary | ICD-10-CM | POA: Diagnosis not present

## 2022-07-11 DIAGNOSIS — F0390 Unspecified dementia without behavioral disturbance: Secondary | ICD-10-CM | POA: Diagnosis not present

## 2022-07-14 DIAGNOSIS — R262 Difficulty in walking, not elsewhere classified: Secondary | ICD-10-CM | POA: Diagnosis not present

## 2022-07-14 DIAGNOSIS — M6259 Muscle wasting and atrophy, not elsewhere classified, multiple sites: Secondary | ICD-10-CM | POA: Diagnosis not present

## 2022-07-14 DIAGNOSIS — F0390 Unspecified dementia without behavioral disturbance: Secondary | ICD-10-CM | POA: Diagnosis not present

## 2022-07-14 DIAGNOSIS — R1312 Dysphagia, oropharyngeal phase: Secondary | ICD-10-CM | POA: Diagnosis not present

## 2022-07-14 DIAGNOSIS — G9340 Encephalopathy, unspecified: Secondary | ICD-10-CM | POA: Diagnosis not present

## 2022-07-14 DIAGNOSIS — R278 Other lack of coordination: Secondary | ICD-10-CM | POA: Diagnosis not present

## 2022-07-15 DIAGNOSIS — R278 Other lack of coordination: Secondary | ICD-10-CM | POA: Diagnosis not present

## 2022-07-15 DIAGNOSIS — R262 Difficulty in walking, not elsewhere classified: Secondary | ICD-10-CM | POA: Diagnosis not present

## 2022-07-15 DIAGNOSIS — F0390 Unspecified dementia without behavioral disturbance: Secondary | ICD-10-CM | POA: Diagnosis not present

## 2022-07-15 DIAGNOSIS — M6259 Muscle wasting and atrophy, not elsewhere classified, multiple sites: Secondary | ICD-10-CM | POA: Diagnosis not present

## 2022-07-15 DIAGNOSIS — R1312 Dysphagia, oropharyngeal phase: Secondary | ICD-10-CM | POA: Diagnosis not present

## 2022-07-15 DIAGNOSIS — G9340 Encephalopathy, unspecified: Secondary | ICD-10-CM | POA: Diagnosis not present

## 2022-07-16 DIAGNOSIS — F0390 Unspecified dementia without behavioral disturbance: Secondary | ICD-10-CM | POA: Diagnosis not present

## 2022-07-16 DIAGNOSIS — M6259 Muscle wasting and atrophy, not elsewhere classified, multiple sites: Secondary | ICD-10-CM | POA: Diagnosis not present

## 2022-07-16 DIAGNOSIS — R262 Difficulty in walking, not elsewhere classified: Secondary | ICD-10-CM | POA: Diagnosis not present

## 2022-07-16 DIAGNOSIS — R278 Other lack of coordination: Secondary | ICD-10-CM | POA: Diagnosis not present

## 2022-07-16 DIAGNOSIS — R1312 Dysphagia, oropharyngeal phase: Secondary | ICD-10-CM | POA: Diagnosis not present

## 2022-07-16 DIAGNOSIS — G9340 Encephalopathy, unspecified: Secondary | ICD-10-CM | POA: Diagnosis not present

## 2022-07-17 DIAGNOSIS — M6259 Muscle wasting and atrophy, not elsewhere classified, multiple sites: Secondary | ICD-10-CM | POA: Diagnosis not present

## 2022-07-17 DIAGNOSIS — G9340 Encephalopathy, unspecified: Secondary | ICD-10-CM | POA: Diagnosis not present

## 2022-07-17 DIAGNOSIS — R1312 Dysphagia, oropharyngeal phase: Secondary | ICD-10-CM | POA: Diagnosis not present

## 2022-07-17 DIAGNOSIS — R262 Difficulty in walking, not elsewhere classified: Secondary | ICD-10-CM | POA: Diagnosis not present

## 2022-07-17 DIAGNOSIS — F0390 Unspecified dementia without behavioral disturbance: Secondary | ICD-10-CM | POA: Diagnosis not present

## 2022-07-17 DIAGNOSIS — R278 Other lack of coordination: Secondary | ICD-10-CM | POA: Diagnosis not present

## 2022-07-18 DIAGNOSIS — R262 Difficulty in walking, not elsewhere classified: Secondary | ICD-10-CM | POA: Diagnosis not present

## 2022-07-18 DIAGNOSIS — R278 Other lack of coordination: Secondary | ICD-10-CM | POA: Diagnosis not present

## 2022-07-18 DIAGNOSIS — F0390 Unspecified dementia without behavioral disturbance: Secondary | ICD-10-CM | POA: Diagnosis not present

## 2022-07-18 DIAGNOSIS — M6259 Muscle wasting and atrophy, not elsewhere classified, multiple sites: Secondary | ICD-10-CM | POA: Diagnosis not present

## 2022-07-18 DIAGNOSIS — R1312 Dysphagia, oropharyngeal phase: Secondary | ICD-10-CM | POA: Diagnosis not present

## 2022-07-18 DIAGNOSIS — G9340 Encephalopathy, unspecified: Secondary | ICD-10-CM | POA: Diagnosis not present

## 2022-07-21 DIAGNOSIS — R278 Other lack of coordination: Secondary | ICD-10-CM | POA: Diagnosis not present

## 2022-07-21 DIAGNOSIS — G9340 Encephalopathy, unspecified: Secondary | ICD-10-CM | POA: Diagnosis not present

## 2022-07-21 DIAGNOSIS — R1312 Dysphagia, oropharyngeal phase: Secondary | ICD-10-CM | POA: Diagnosis not present

## 2022-07-21 DIAGNOSIS — M6259 Muscle wasting and atrophy, not elsewhere classified, multiple sites: Secondary | ICD-10-CM | POA: Diagnosis not present

## 2022-07-21 DIAGNOSIS — R262 Difficulty in walking, not elsewhere classified: Secondary | ICD-10-CM | POA: Diagnosis not present

## 2022-07-21 DIAGNOSIS — F0390 Unspecified dementia without behavioral disturbance: Secondary | ICD-10-CM | POA: Diagnosis not present

## 2022-07-22 DIAGNOSIS — M6259 Muscle wasting and atrophy, not elsewhere classified, multiple sites: Secondary | ICD-10-CM | POA: Diagnosis not present

## 2022-07-22 DIAGNOSIS — F0390 Unspecified dementia without behavioral disturbance: Secondary | ICD-10-CM | POA: Diagnosis not present

## 2022-07-22 DIAGNOSIS — R262 Difficulty in walking, not elsewhere classified: Secondary | ICD-10-CM | POA: Diagnosis not present

## 2022-07-22 DIAGNOSIS — G9340 Encephalopathy, unspecified: Secondary | ICD-10-CM | POA: Diagnosis not present

## 2022-07-22 DIAGNOSIS — R1312 Dysphagia, oropharyngeal phase: Secondary | ICD-10-CM | POA: Diagnosis not present

## 2022-07-22 DIAGNOSIS — R278 Other lack of coordination: Secondary | ICD-10-CM | POA: Diagnosis not present

## 2022-07-23 DIAGNOSIS — M6259 Muscle wasting and atrophy, not elsewhere classified, multiple sites: Secondary | ICD-10-CM | POA: Diagnosis not present

## 2022-07-23 DIAGNOSIS — R278 Other lack of coordination: Secondary | ICD-10-CM | POA: Diagnosis not present

## 2022-07-23 DIAGNOSIS — R262 Difficulty in walking, not elsewhere classified: Secondary | ICD-10-CM | POA: Diagnosis not present

## 2022-07-23 DIAGNOSIS — G9340 Encephalopathy, unspecified: Secondary | ICD-10-CM | POA: Diagnosis not present

## 2022-07-23 DIAGNOSIS — R1312 Dysphagia, oropharyngeal phase: Secondary | ICD-10-CM | POA: Diagnosis not present

## 2022-07-23 DIAGNOSIS — F0390 Unspecified dementia without behavioral disturbance: Secondary | ICD-10-CM | POA: Diagnosis not present

## 2022-07-24 DIAGNOSIS — G9340 Encephalopathy, unspecified: Secondary | ICD-10-CM | POA: Diagnosis not present

## 2022-07-24 DIAGNOSIS — R262 Difficulty in walking, not elsewhere classified: Secondary | ICD-10-CM | POA: Diagnosis not present

## 2022-07-24 DIAGNOSIS — R1312 Dysphagia, oropharyngeal phase: Secondary | ICD-10-CM | POA: Diagnosis not present

## 2022-07-24 DIAGNOSIS — R278 Other lack of coordination: Secondary | ICD-10-CM | POA: Diagnosis not present

## 2022-07-24 DIAGNOSIS — F0390 Unspecified dementia without behavioral disturbance: Secondary | ICD-10-CM | POA: Diagnosis not present

## 2022-07-24 DIAGNOSIS — M6259 Muscle wasting and atrophy, not elsewhere classified, multiple sites: Secondary | ICD-10-CM | POA: Diagnosis not present

## 2022-07-25 DIAGNOSIS — G9340 Encephalopathy, unspecified: Secondary | ICD-10-CM | POA: Diagnosis not present

## 2022-07-25 DIAGNOSIS — E785 Hyperlipidemia, unspecified: Secondary | ICD-10-CM | POA: Diagnosis not present

## 2022-07-25 DIAGNOSIS — K2970 Gastritis, unspecified, without bleeding: Secondary | ICD-10-CM | POA: Diagnosis not present

## 2022-07-25 DIAGNOSIS — F0390 Unspecified dementia without behavioral disturbance: Secondary | ICD-10-CM | POA: Diagnosis not present

## 2022-07-25 DIAGNOSIS — R278 Other lack of coordination: Secondary | ICD-10-CM | POA: Diagnosis not present

## 2022-07-25 DIAGNOSIS — R262 Difficulty in walking, not elsewhere classified: Secondary | ICD-10-CM | POA: Diagnosis not present

## 2022-07-25 DIAGNOSIS — R1312 Dysphagia, oropharyngeal phase: Secondary | ICD-10-CM | POA: Diagnosis not present

## 2022-07-25 DIAGNOSIS — H409 Unspecified glaucoma: Secondary | ICD-10-CM | POA: Diagnosis not present

## 2022-07-25 DIAGNOSIS — C253 Malignant neoplasm of pancreatic duct: Secondary | ICD-10-CM | POA: Diagnosis not present

## 2022-07-25 DIAGNOSIS — K219 Gastro-esophageal reflux disease without esophagitis: Secondary | ICD-10-CM | POA: Diagnosis not present

## 2022-07-25 DIAGNOSIS — M6259 Muscle wasting and atrophy, not elsewhere classified, multiple sites: Secondary | ICD-10-CM | POA: Diagnosis not present

## 2022-07-25 DIAGNOSIS — H259 Unspecified age-related cataract: Secondary | ICD-10-CM | POA: Diagnosis not present

## 2022-07-25 DIAGNOSIS — K869 Disease of pancreas, unspecified: Secondary | ICD-10-CM | POA: Diagnosis not present

## 2022-07-25 DIAGNOSIS — I10 Essential (primary) hypertension: Secondary | ICD-10-CM | POA: Diagnosis not present

## 2022-07-25 DIAGNOSIS — R41841 Cognitive communication deficit: Secondary | ICD-10-CM | POA: Diagnosis not present

## 2022-07-25 DIAGNOSIS — D509 Iron deficiency anemia, unspecified: Secondary | ICD-10-CM | POA: Diagnosis not present

## 2022-07-25 DIAGNOSIS — L89890 Pressure ulcer of other site, unstageable: Secondary | ICD-10-CM | POA: Diagnosis not present

## 2022-07-28 DIAGNOSIS — R278 Other lack of coordination: Secondary | ICD-10-CM | POA: Diagnosis not present

## 2022-07-28 DIAGNOSIS — M6259 Muscle wasting and atrophy, not elsewhere classified, multiple sites: Secondary | ICD-10-CM | POA: Diagnosis not present

## 2022-07-28 DIAGNOSIS — R1312 Dysphagia, oropharyngeal phase: Secondary | ICD-10-CM | POA: Diagnosis not present

## 2022-07-28 DIAGNOSIS — F0390 Unspecified dementia without behavioral disturbance: Secondary | ICD-10-CM | POA: Diagnosis not present

## 2022-07-28 DIAGNOSIS — G9340 Encephalopathy, unspecified: Secondary | ICD-10-CM | POA: Diagnosis not present

## 2022-07-28 DIAGNOSIS — R262 Difficulty in walking, not elsewhere classified: Secondary | ICD-10-CM | POA: Diagnosis not present

## 2022-07-29 DIAGNOSIS — R1312 Dysphagia, oropharyngeal phase: Secondary | ICD-10-CM | POA: Diagnosis not present

## 2022-07-29 DIAGNOSIS — M6259 Muscle wasting and atrophy, not elsewhere classified, multiple sites: Secondary | ICD-10-CM | POA: Diagnosis not present

## 2022-07-29 DIAGNOSIS — E43 Unspecified severe protein-calorie malnutrition: Secondary | ICD-10-CM | POA: Diagnosis not present

## 2022-07-29 DIAGNOSIS — C253 Malignant neoplasm of pancreatic duct: Secondary | ICD-10-CM | POA: Diagnosis not present

## 2022-07-29 DIAGNOSIS — G9340 Encephalopathy, unspecified: Secondary | ICD-10-CM | POA: Diagnosis not present

## 2022-07-29 DIAGNOSIS — F0390 Unspecified dementia without behavioral disturbance: Secondary | ICD-10-CM | POA: Diagnosis not present

## 2022-07-29 DIAGNOSIS — E559 Vitamin D deficiency, unspecified: Secondary | ICD-10-CM | POA: Diagnosis not present

## 2022-07-29 DIAGNOSIS — R278 Other lack of coordination: Secondary | ICD-10-CM | POA: Diagnosis not present

## 2022-07-29 DIAGNOSIS — R262 Difficulty in walking, not elsewhere classified: Secondary | ICD-10-CM | POA: Diagnosis not present

## 2022-07-30 DIAGNOSIS — R1312 Dysphagia, oropharyngeal phase: Secondary | ICD-10-CM | POA: Diagnosis not present

## 2022-07-30 DIAGNOSIS — F0390 Unspecified dementia without behavioral disturbance: Secondary | ICD-10-CM | POA: Diagnosis not present

## 2022-07-30 DIAGNOSIS — E559 Vitamin D deficiency, unspecified: Secondary | ICD-10-CM | POA: Diagnosis not present

## 2022-07-30 DIAGNOSIS — R262 Difficulty in walking, not elsewhere classified: Secondary | ICD-10-CM | POA: Diagnosis not present

## 2022-07-30 DIAGNOSIS — M6259 Muscle wasting and atrophy, not elsewhere classified, multiple sites: Secondary | ICD-10-CM | POA: Diagnosis not present

## 2022-07-30 DIAGNOSIS — R278 Other lack of coordination: Secondary | ICD-10-CM | POA: Diagnosis not present

## 2022-07-30 DIAGNOSIS — G9340 Encephalopathy, unspecified: Secondary | ICD-10-CM | POA: Diagnosis not present

## 2022-07-30 DIAGNOSIS — E0590 Thyrotoxicosis, unspecified without thyrotoxic crisis or storm: Secondary | ICD-10-CM | POA: Diagnosis not present

## 2022-07-31 DIAGNOSIS — R262 Difficulty in walking, not elsewhere classified: Secondary | ICD-10-CM | POA: Diagnosis not present

## 2022-07-31 DIAGNOSIS — G9340 Encephalopathy, unspecified: Secondary | ICD-10-CM | POA: Diagnosis not present

## 2022-07-31 DIAGNOSIS — M6259 Muscle wasting and atrophy, not elsewhere classified, multiple sites: Secondary | ICD-10-CM | POA: Diagnosis not present

## 2022-07-31 DIAGNOSIS — R1312 Dysphagia, oropharyngeal phase: Secondary | ICD-10-CM | POA: Diagnosis not present

## 2022-07-31 DIAGNOSIS — F0390 Unspecified dementia without behavioral disturbance: Secondary | ICD-10-CM | POA: Diagnosis not present

## 2022-07-31 DIAGNOSIS — E559 Vitamin D deficiency, unspecified: Secondary | ICD-10-CM | POA: Diagnosis not present

## 2022-07-31 DIAGNOSIS — R278 Other lack of coordination: Secondary | ICD-10-CM | POA: Diagnosis not present

## 2022-07-31 DIAGNOSIS — E876 Hypokalemia: Secondary | ICD-10-CM | POA: Diagnosis not present

## 2022-08-01 DIAGNOSIS — R1312 Dysphagia, oropharyngeal phase: Secondary | ICD-10-CM | POA: Diagnosis not present

## 2022-08-01 DIAGNOSIS — R278 Other lack of coordination: Secondary | ICD-10-CM | POA: Diagnosis not present

## 2022-08-01 DIAGNOSIS — M6259 Muscle wasting and atrophy, not elsewhere classified, multiple sites: Secondary | ICD-10-CM | POA: Diagnosis not present

## 2022-08-01 DIAGNOSIS — G9340 Encephalopathy, unspecified: Secondary | ICD-10-CM | POA: Diagnosis not present

## 2022-08-01 DIAGNOSIS — F0390 Unspecified dementia without behavioral disturbance: Secondary | ICD-10-CM | POA: Diagnosis not present

## 2022-08-01 DIAGNOSIS — R262 Difficulty in walking, not elsewhere classified: Secondary | ICD-10-CM | POA: Diagnosis not present

## 2022-08-04 DIAGNOSIS — R278 Other lack of coordination: Secondary | ICD-10-CM | POA: Diagnosis not present

## 2022-08-04 DIAGNOSIS — G9340 Encephalopathy, unspecified: Secondary | ICD-10-CM | POA: Diagnosis not present

## 2022-08-04 DIAGNOSIS — R1312 Dysphagia, oropharyngeal phase: Secondary | ICD-10-CM | POA: Diagnosis not present

## 2022-08-04 DIAGNOSIS — R262 Difficulty in walking, not elsewhere classified: Secondary | ICD-10-CM | POA: Diagnosis not present

## 2022-08-04 DIAGNOSIS — F0390 Unspecified dementia without behavioral disturbance: Secondary | ICD-10-CM | POA: Diagnosis not present

## 2022-08-04 DIAGNOSIS — M6259 Muscle wasting and atrophy, not elsewhere classified, multiple sites: Secondary | ICD-10-CM | POA: Diagnosis not present

## 2022-08-05 DIAGNOSIS — F0390 Unspecified dementia without behavioral disturbance: Secondary | ICD-10-CM | POA: Diagnosis not present

## 2022-08-05 DIAGNOSIS — R278 Other lack of coordination: Secondary | ICD-10-CM | POA: Diagnosis not present

## 2022-08-05 DIAGNOSIS — R262 Difficulty in walking, not elsewhere classified: Secondary | ICD-10-CM | POA: Diagnosis not present

## 2022-08-05 DIAGNOSIS — M6259 Muscle wasting and atrophy, not elsewhere classified, multiple sites: Secondary | ICD-10-CM | POA: Diagnosis not present

## 2022-08-05 DIAGNOSIS — R1312 Dysphagia, oropharyngeal phase: Secondary | ICD-10-CM | POA: Diagnosis not present

## 2022-08-05 DIAGNOSIS — G9340 Encephalopathy, unspecified: Secondary | ICD-10-CM | POA: Diagnosis not present

## 2022-08-06 DIAGNOSIS — F0390 Unspecified dementia without behavioral disturbance: Secondary | ICD-10-CM | POA: Diagnosis not present

## 2022-08-06 DIAGNOSIS — R278 Other lack of coordination: Secondary | ICD-10-CM | POA: Diagnosis not present

## 2022-08-06 DIAGNOSIS — M6259 Muscle wasting and atrophy, not elsewhere classified, multiple sites: Secondary | ICD-10-CM | POA: Diagnosis not present

## 2022-08-06 DIAGNOSIS — G9340 Encephalopathy, unspecified: Secondary | ICD-10-CM | POA: Diagnosis not present

## 2022-08-06 DIAGNOSIS — R1312 Dysphagia, oropharyngeal phase: Secondary | ICD-10-CM | POA: Diagnosis not present

## 2022-08-06 DIAGNOSIS — R262 Difficulty in walking, not elsewhere classified: Secondary | ICD-10-CM | POA: Diagnosis not present

## 2022-08-07 DIAGNOSIS — G9340 Encephalopathy, unspecified: Secondary | ICD-10-CM | POA: Diagnosis not present

## 2022-08-07 DIAGNOSIS — M6259 Muscle wasting and atrophy, not elsewhere classified, multiple sites: Secondary | ICD-10-CM | POA: Diagnosis not present

## 2022-08-07 DIAGNOSIS — F0390 Unspecified dementia without behavioral disturbance: Secondary | ICD-10-CM | POA: Diagnosis not present

## 2022-08-07 DIAGNOSIS — R262 Difficulty in walking, not elsewhere classified: Secondary | ICD-10-CM | POA: Diagnosis not present

## 2022-08-07 DIAGNOSIS — R1312 Dysphagia, oropharyngeal phase: Secondary | ICD-10-CM | POA: Diagnosis not present

## 2022-08-07 DIAGNOSIS — R278 Other lack of coordination: Secondary | ICD-10-CM | POA: Diagnosis not present

## 2022-08-08 DIAGNOSIS — F0390 Unspecified dementia without behavioral disturbance: Secondary | ICD-10-CM | POA: Diagnosis not present

## 2022-08-08 DIAGNOSIS — R278 Other lack of coordination: Secondary | ICD-10-CM | POA: Diagnosis not present

## 2022-08-08 DIAGNOSIS — G9340 Encephalopathy, unspecified: Secondary | ICD-10-CM | POA: Diagnosis not present

## 2022-08-08 DIAGNOSIS — R1312 Dysphagia, oropharyngeal phase: Secondary | ICD-10-CM | POA: Diagnosis not present

## 2022-08-08 DIAGNOSIS — M6259 Muscle wasting and atrophy, not elsewhere classified, multiple sites: Secondary | ICD-10-CM | POA: Diagnosis not present

## 2022-08-08 DIAGNOSIS — R262 Difficulty in walking, not elsewhere classified: Secondary | ICD-10-CM | POA: Diagnosis not present

## 2022-08-11 DIAGNOSIS — R278 Other lack of coordination: Secondary | ICD-10-CM | POA: Diagnosis not present

## 2022-08-11 DIAGNOSIS — R262 Difficulty in walking, not elsewhere classified: Secondary | ICD-10-CM | POA: Diagnosis not present

## 2022-08-11 DIAGNOSIS — G9340 Encephalopathy, unspecified: Secondary | ICD-10-CM | POA: Diagnosis not present

## 2022-08-11 DIAGNOSIS — M6259 Muscle wasting and atrophy, not elsewhere classified, multiple sites: Secondary | ICD-10-CM | POA: Diagnosis not present

## 2022-08-11 DIAGNOSIS — F0390 Unspecified dementia without behavioral disturbance: Secondary | ICD-10-CM | POA: Diagnosis not present

## 2022-08-11 DIAGNOSIS — R1312 Dysphagia, oropharyngeal phase: Secondary | ICD-10-CM | POA: Diagnosis not present

## 2022-08-14 DIAGNOSIS — F0390 Unspecified dementia without behavioral disturbance: Secondary | ICD-10-CM | POA: Diagnosis not present

## 2022-08-14 DIAGNOSIS — G9340 Encephalopathy, unspecified: Secondary | ICD-10-CM | POA: Diagnosis not present

## 2022-08-14 DIAGNOSIS — R1312 Dysphagia, oropharyngeal phase: Secondary | ICD-10-CM | POA: Diagnosis not present

## 2022-08-14 DIAGNOSIS — M6259 Muscle wasting and atrophy, not elsewhere classified, multiple sites: Secondary | ICD-10-CM | POA: Diagnosis not present

## 2022-08-14 DIAGNOSIS — R262 Difficulty in walking, not elsewhere classified: Secondary | ICD-10-CM | POA: Diagnosis not present

## 2022-08-14 DIAGNOSIS — R278 Other lack of coordination: Secondary | ICD-10-CM | POA: Diagnosis not present

## 2022-08-15 DIAGNOSIS — M6259 Muscle wasting and atrophy, not elsewhere classified, multiple sites: Secondary | ICD-10-CM | POA: Diagnosis not present

## 2022-08-15 DIAGNOSIS — F0390 Unspecified dementia without behavioral disturbance: Secondary | ICD-10-CM | POA: Diagnosis not present

## 2022-08-15 DIAGNOSIS — R278 Other lack of coordination: Secondary | ICD-10-CM | POA: Diagnosis not present

## 2022-08-15 DIAGNOSIS — R1312 Dysphagia, oropharyngeal phase: Secondary | ICD-10-CM | POA: Diagnosis not present

## 2022-08-15 DIAGNOSIS — G9340 Encephalopathy, unspecified: Secondary | ICD-10-CM | POA: Diagnosis not present

## 2022-08-15 DIAGNOSIS — R262 Difficulty in walking, not elsewhere classified: Secondary | ICD-10-CM | POA: Diagnosis not present

## 2022-08-18 DIAGNOSIS — R1312 Dysphagia, oropharyngeal phase: Secondary | ICD-10-CM | POA: Diagnosis not present

## 2022-08-18 DIAGNOSIS — M6259 Muscle wasting and atrophy, not elsewhere classified, multiple sites: Secondary | ICD-10-CM | POA: Diagnosis not present

## 2022-08-18 DIAGNOSIS — R262 Difficulty in walking, not elsewhere classified: Secondary | ICD-10-CM | POA: Diagnosis not present

## 2022-08-18 DIAGNOSIS — R278 Other lack of coordination: Secondary | ICD-10-CM | POA: Diagnosis not present

## 2022-08-18 DIAGNOSIS — G9340 Encephalopathy, unspecified: Secondary | ICD-10-CM | POA: Diagnosis not present

## 2022-08-18 DIAGNOSIS — F0390 Unspecified dementia without behavioral disturbance: Secondary | ICD-10-CM | POA: Diagnosis not present

## 2022-09-04 DIAGNOSIS — E785 Hyperlipidemia, unspecified: Secondary | ICD-10-CM | POA: Diagnosis not present

## 2022-09-04 DIAGNOSIS — R1312 Dysphagia, oropharyngeal phase: Secondary | ICD-10-CM | POA: Diagnosis not present

## 2022-09-04 DIAGNOSIS — L89890 Pressure ulcer of other site, unstageable: Secondary | ICD-10-CM | POA: Diagnosis not present

## 2022-09-04 DIAGNOSIS — K219 Gastro-esophageal reflux disease without esophagitis: Secondary | ICD-10-CM | POA: Diagnosis not present

## 2022-09-04 DIAGNOSIS — M6259 Muscle wasting and atrophy, not elsewhere classified, multiple sites: Secondary | ICD-10-CM | POA: Diagnosis not present

## 2022-09-04 DIAGNOSIS — G9340 Encephalopathy, unspecified: Secondary | ICD-10-CM | POA: Diagnosis not present

## 2022-09-04 DIAGNOSIS — H409 Unspecified glaucoma: Secondary | ICD-10-CM | POA: Diagnosis not present

## 2022-09-04 DIAGNOSIS — K2970 Gastritis, unspecified, without bleeding: Secondary | ICD-10-CM | POA: Diagnosis not present

## 2022-09-04 DIAGNOSIS — D509 Iron deficiency anemia, unspecified: Secondary | ICD-10-CM | POA: Diagnosis not present

## 2022-09-04 DIAGNOSIS — C253 Malignant neoplasm of pancreatic duct: Secondary | ICD-10-CM | POA: Diagnosis not present

## 2022-09-04 DIAGNOSIS — R41841 Cognitive communication deficit: Secondary | ICD-10-CM | POA: Diagnosis not present

## 2022-09-04 DIAGNOSIS — R278 Other lack of coordination: Secondary | ICD-10-CM | POA: Diagnosis not present

## 2022-09-04 DIAGNOSIS — H259 Unspecified age-related cataract: Secondary | ICD-10-CM | POA: Diagnosis not present

## 2022-09-04 DIAGNOSIS — I10 Essential (primary) hypertension: Secondary | ICD-10-CM | POA: Diagnosis not present

## 2022-09-04 DIAGNOSIS — R262 Difficulty in walking, not elsewhere classified: Secondary | ICD-10-CM | POA: Diagnosis not present

## 2022-09-04 DIAGNOSIS — Z23 Encounter for immunization: Secondary | ICD-10-CM | POA: Diagnosis not present

## 2022-09-04 DIAGNOSIS — F0390 Unspecified dementia without behavioral disturbance: Secondary | ICD-10-CM | POA: Diagnosis not present

## 2022-09-04 DIAGNOSIS — K869 Disease of pancreas, unspecified: Secondary | ICD-10-CM | POA: Diagnosis not present

## 2022-09-20 DIAGNOSIS — E559 Vitamin D deficiency, unspecified: Secondary | ICD-10-CM | POA: Diagnosis not present

## 2022-09-20 DIAGNOSIS — E43 Unspecified severe protein-calorie malnutrition: Secondary | ICD-10-CM | POA: Diagnosis not present

## 2022-09-20 DIAGNOSIS — F0390 Unspecified dementia without behavioral disturbance: Secondary | ICD-10-CM | POA: Diagnosis not present

## 2022-09-20 DIAGNOSIS — E876 Hypokalemia: Secondary | ICD-10-CM | POA: Diagnosis not present

## 2022-09-24 DIAGNOSIS — R634 Abnormal weight loss: Secondary | ICD-10-CM | POA: Diagnosis not present

## 2022-09-24 DIAGNOSIS — C253 Malignant neoplasm of pancreatic duct: Secondary | ICD-10-CM | POA: Diagnosis not present

## 2022-09-24 DIAGNOSIS — Z9689 Presence of other specified functional implants: Secondary | ICD-10-CM | POA: Diagnosis not present

## 2022-09-24 DIAGNOSIS — E43 Unspecified severe protein-calorie malnutrition: Secondary | ICD-10-CM | POA: Diagnosis not present

## 2022-09-24 DIAGNOSIS — I10 Essential (primary) hypertension: Secondary | ICD-10-CM | POA: Diagnosis not present

## 2022-09-24 DIAGNOSIS — Z9181 History of falling: Secondary | ICD-10-CM | POA: Diagnosis not present

## 2022-09-24 DIAGNOSIS — E559 Vitamin D deficiency, unspecified: Secondary | ICD-10-CM | POA: Diagnosis not present

## 2022-09-24 DIAGNOSIS — H269 Unspecified cataract: Secondary | ICD-10-CM | POA: Diagnosis not present

## 2022-09-24 DIAGNOSIS — Z993 Dependence on wheelchair: Secondary | ICD-10-CM | POA: Diagnosis not present

## 2022-09-24 DIAGNOSIS — Z6824 Body mass index (BMI) 24.0-24.9, adult: Secondary | ICD-10-CM | POA: Diagnosis not present

## 2022-09-24 DIAGNOSIS — F0390 Unspecified dementia without behavioral disturbance: Secondary | ICD-10-CM | POA: Diagnosis not present

## 2022-09-24 DIAGNOSIS — E785 Hyperlipidemia, unspecified: Secondary | ICD-10-CM | POA: Diagnosis not present

## 2022-09-24 DIAGNOSIS — K219 Gastro-esophageal reflux disease without esophagitis: Secondary | ICD-10-CM | POA: Diagnosis not present

## 2022-09-24 DIAGNOSIS — N3010 Interstitial cystitis (chronic) without hematuria: Secondary | ICD-10-CM | POA: Diagnosis not present

## 2022-09-26 DIAGNOSIS — C253 Malignant neoplasm of pancreatic duct: Secondary | ICD-10-CM | POA: Diagnosis not present

## 2022-09-26 DIAGNOSIS — H409 Unspecified glaucoma: Secondary | ICD-10-CM | POA: Diagnosis not present

## 2022-09-26 DIAGNOSIS — E43 Unspecified severe protein-calorie malnutrition: Secondary | ICD-10-CM | POA: Diagnosis not present

## 2022-09-26 DIAGNOSIS — M1990 Unspecified osteoarthritis, unspecified site: Secondary | ICD-10-CM | POA: Diagnosis not present

## 2022-09-26 DIAGNOSIS — K219 Gastro-esophageal reflux disease without esophagitis: Secondary | ICD-10-CM | POA: Diagnosis not present

## 2022-09-26 DIAGNOSIS — I10 Essential (primary) hypertension: Secondary | ICD-10-CM | POA: Diagnosis not present

## 2022-09-26 DIAGNOSIS — R634 Abnormal weight loss: Secondary | ICD-10-CM | POA: Diagnosis not present

## 2022-09-26 DIAGNOSIS — H9190 Unspecified hearing loss, unspecified ear: Secondary | ICD-10-CM | POA: Diagnosis not present

## 2022-09-26 DIAGNOSIS — N3010 Interstitial cystitis (chronic) without hematuria: Secondary | ICD-10-CM | POA: Diagnosis not present

## 2022-09-26 DIAGNOSIS — E785 Hyperlipidemia, unspecified: Secondary | ICD-10-CM | POA: Diagnosis not present

## 2022-09-26 DIAGNOSIS — F0390 Unspecified dementia without behavioral disturbance: Secondary | ICD-10-CM | POA: Diagnosis not present

## 2022-09-26 DIAGNOSIS — R1310 Dysphagia, unspecified: Secondary | ICD-10-CM | POA: Diagnosis not present

## 2022-09-26 DIAGNOSIS — F32A Depression, unspecified: Secondary | ICD-10-CM | POA: Diagnosis not present

## 2022-10-01 DIAGNOSIS — R634 Abnormal weight loss: Secondary | ICD-10-CM | POA: Diagnosis not present

## 2022-10-01 DIAGNOSIS — I10 Essential (primary) hypertension: Secondary | ICD-10-CM | POA: Diagnosis not present

## 2022-10-01 DIAGNOSIS — E43 Unspecified severe protein-calorie malnutrition: Secondary | ICD-10-CM | POA: Diagnosis not present

## 2022-10-01 DIAGNOSIS — N3010 Interstitial cystitis (chronic) without hematuria: Secondary | ICD-10-CM | POA: Diagnosis not present

## 2022-10-01 DIAGNOSIS — F0390 Unspecified dementia without behavioral disturbance: Secondary | ICD-10-CM | POA: Diagnosis not present

## 2022-10-01 DIAGNOSIS — C253 Malignant neoplasm of pancreatic duct: Secondary | ICD-10-CM | POA: Diagnosis not present

## 2022-10-14 DIAGNOSIS — E785 Hyperlipidemia, unspecified: Secondary | ICD-10-CM | POA: Diagnosis not present

## 2022-10-14 DIAGNOSIS — F0390 Unspecified dementia without behavioral disturbance: Secondary | ICD-10-CM | POA: Diagnosis not present

## 2022-10-14 DIAGNOSIS — R41841 Cognitive communication deficit: Secondary | ICD-10-CM | POA: Diagnosis not present

## 2022-10-14 DIAGNOSIS — C253 Malignant neoplasm of pancreatic duct: Secondary | ICD-10-CM | POA: Diagnosis not present

## 2022-10-14 DIAGNOSIS — Z66 Do not resuscitate: Secondary | ICD-10-CM | POA: Diagnosis not present

## 2022-10-14 DIAGNOSIS — I10 Essential (primary) hypertension: Secondary | ICD-10-CM | POA: Diagnosis not present

## 2022-10-16 DIAGNOSIS — Z66 Do not resuscitate: Secondary | ICD-10-CM | POA: Diagnosis not present

## 2022-10-16 DIAGNOSIS — I10 Essential (primary) hypertension: Secondary | ICD-10-CM | POA: Diagnosis not present

## 2022-10-16 DIAGNOSIS — F0390 Unspecified dementia without behavioral disturbance: Secondary | ICD-10-CM | POA: Diagnosis not present

## 2022-10-16 DIAGNOSIS — R41841 Cognitive communication deficit: Secondary | ICD-10-CM | POA: Diagnosis not present

## 2022-10-16 DIAGNOSIS — C253 Malignant neoplasm of pancreatic duct: Secondary | ICD-10-CM | POA: Diagnosis not present

## 2022-10-16 DIAGNOSIS — E785 Hyperlipidemia, unspecified: Secondary | ICD-10-CM | POA: Diagnosis not present

## 2022-10-17 DIAGNOSIS — C253 Malignant neoplasm of pancreatic duct: Secondary | ICD-10-CM | POA: Diagnosis not present

## 2022-10-17 DIAGNOSIS — F0390 Unspecified dementia without behavioral disturbance: Secondary | ICD-10-CM | POA: Diagnosis not present

## 2022-10-17 DIAGNOSIS — I10 Essential (primary) hypertension: Secondary | ICD-10-CM | POA: Diagnosis not present

## 2022-10-17 DIAGNOSIS — Z66 Do not resuscitate: Secondary | ICD-10-CM | POA: Diagnosis not present

## 2022-10-17 DIAGNOSIS — E785 Hyperlipidemia, unspecified: Secondary | ICD-10-CM | POA: Diagnosis not present

## 2022-10-17 DIAGNOSIS — R41841 Cognitive communication deficit: Secondary | ICD-10-CM | POA: Diagnosis not present

## 2022-10-20 DIAGNOSIS — R41841 Cognitive communication deficit: Secondary | ICD-10-CM | POA: Diagnosis not present

## 2022-10-20 DIAGNOSIS — I10 Essential (primary) hypertension: Secondary | ICD-10-CM | POA: Diagnosis not present

## 2022-10-20 DIAGNOSIS — Z66 Do not resuscitate: Secondary | ICD-10-CM | POA: Diagnosis not present

## 2022-10-20 DIAGNOSIS — F0390 Unspecified dementia without behavioral disturbance: Secondary | ICD-10-CM | POA: Diagnosis not present

## 2022-10-20 DIAGNOSIS — C253 Malignant neoplasm of pancreatic duct: Secondary | ICD-10-CM | POA: Diagnosis not present

## 2022-10-20 DIAGNOSIS — E785 Hyperlipidemia, unspecified: Secondary | ICD-10-CM | POA: Diagnosis not present

## 2022-10-22 DIAGNOSIS — F0390 Unspecified dementia without behavioral disturbance: Secondary | ICD-10-CM | POA: Diagnosis not present

## 2022-10-22 DIAGNOSIS — Z66 Do not resuscitate: Secondary | ICD-10-CM | POA: Diagnosis not present

## 2022-10-22 DIAGNOSIS — I10 Essential (primary) hypertension: Secondary | ICD-10-CM | POA: Diagnosis not present

## 2022-10-22 DIAGNOSIS — C253 Malignant neoplasm of pancreatic duct: Secondary | ICD-10-CM | POA: Diagnosis not present

## 2022-10-22 DIAGNOSIS — E785 Hyperlipidemia, unspecified: Secondary | ICD-10-CM | POA: Diagnosis not present

## 2022-10-22 DIAGNOSIS — R41841 Cognitive communication deficit: Secondary | ICD-10-CM | POA: Diagnosis not present

## 2022-10-23 DIAGNOSIS — C253 Malignant neoplasm of pancreatic duct: Secondary | ICD-10-CM | POA: Diagnosis not present

## 2022-10-23 DIAGNOSIS — I10 Essential (primary) hypertension: Secondary | ICD-10-CM | POA: Diagnosis not present

## 2022-10-23 DIAGNOSIS — F0390 Unspecified dementia without behavioral disturbance: Secondary | ICD-10-CM | POA: Diagnosis not present

## 2022-10-23 DIAGNOSIS — Z66 Do not resuscitate: Secondary | ICD-10-CM | POA: Diagnosis not present

## 2022-10-23 DIAGNOSIS — R41841 Cognitive communication deficit: Secondary | ICD-10-CM | POA: Diagnosis not present

## 2022-10-23 DIAGNOSIS — E785 Hyperlipidemia, unspecified: Secondary | ICD-10-CM | POA: Diagnosis not present

## 2022-10-24 DIAGNOSIS — Z66 Do not resuscitate: Secondary | ICD-10-CM | POA: Diagnosis not present

## 2022-10-24 DIAGNOSIS — C253 Malignant neoplasm of pancreatic duct: Secondary | ICD-10-CM | POA: Diagnosis not present

## 2022-10-24 DIAGNOSIS — R41841 Cognitive communication deficit: Secondary | ICD-10-CM | POA: Diagnosis not present

## 2022-10-24 DIAGNOSIS — E785 Hyperlipidemia, unspecified: Secondary | ICD-10-CM | POA: Diagnosis not present

## 2022-10-24 DIAGNOSIS — F0390 Unspecified dementia without behavioral disturbance: Secondary | ICD-10-CM | POA: Diagnosis not present

## 2022-10-24 DIAGNOSIS — I10 Essential (primary) hypertension: Secondary | ICD-10-CM | POA: Diagnosis not present

## 2022-10-27 DIAGNOSIS — F0390 Unspecified dementia without behavioral disturbance: Secondary | ICD-10-CM | POA: Diagnosis not present

## 2022-10-27 DIAGNOSIS — I10 Essential (primary) hypertension: Secondary | ICD-10-CM | POA: Diagnosis not present

## 2022-10-27 DIAGNOSIS — Z66 Do not resuscitate: Secondary | ICD-10-CM | POA: Diagnosis not present

## 2022-10-27 DIAGNOSIS — C253 Malignant neoplasm of pancreatic duct: Secondary | ICD-10-CM | POA: Diagnosis not present

## 2022-10-27 DIAGNOSIS — R41841 Cognitive communication deficit: Secondary | ICD-10-CM | POA: Diagnosis not present

## 2022-10-27 DIAGNOSIS — E785 Hyperlipidemia, unspecified: Secondary | ICD-10-CM | POA: Diagnosis not present

## 2022-10-28 DIAGNOSIS — C253 Malignant neoplasm of pancreatic duct: Secondary | ICD-10-CM | POA: Diagnosis not present

## 2022-10-28 DIAGNOSIS — F0390 Unspecified dementia without behavioral disturbance: Secondary | ICD-10-CM | POA: Diagnosis not present

## 2022-10-28 DIAGNOSIS — R41841 Cognitive communication deficit: Secondary | ICD-10-CM | POA: Diagnosis not present

## 2022-10-28 DIAGNOSIS — I10 Essential (primary) hypertension: Secondary | ICD-10-CM | POA: Diagnosis not present

## 2022-10-28 DIAGNOSIS — E785 Hyperlipidemia, unspecified: Secondary | ICD-10-CM | POA: Diagnosis not present

## 2022-10-28 DIAGNOSIS — Z66 Do not resuscitate: Secondary | ICD-10-CM | POA: Diagnosis not present

## 2022-10-29 DIAGNOSIS — C253 Malignant neoplasm of pancreatic duct: Secondary | ICD-10-CM | POA: Diagnosis not present

## 2022-10-29 DIAGNOSIS — Z66 Do not resuscitate: Secondary | ICD-10-CM | POA: Diagnosis not present

## 2022-10-29 DIAGNOSIS — I10 Essential (primary) hypertension: Secondary | ICD-10-CM | POA: Diagnosis not present

## 2022-10-29 DIAGNOSIS — R41841 Cognitive communication deficit: Secondary | ICD-10-CM | POA: Diagnosis not present

## 2022-10-29 DIAGNOSIS — E785 Hyperlipidemia, unspecified: Secondary | ICD-10-CM | POA: Diagnosis not present

## 2022-10-29 DIAGNOSIS — F0390 Unspecified dementia without behavioral disturbance: Secondary | ICD-10-CM | POA: Diagnosis not present

## 2022-11-24 DEATH — deceased

## 2024-05-12 IMAGING — DX DG THORACIC SPINE 1V
1 series · 1 of 1 positions shown · non-contrast
Comparison: Chest two views 08/25/2012; CT chest abdomen and pelvis
04/20/2022

CLINICAL DATA: Evaluate for spinal cord stimulator placement.

EXAM:
LUMBAR SPINE - 1 VIEW; THORACIC SPINE - 1 VIEW

[t-spine ap]
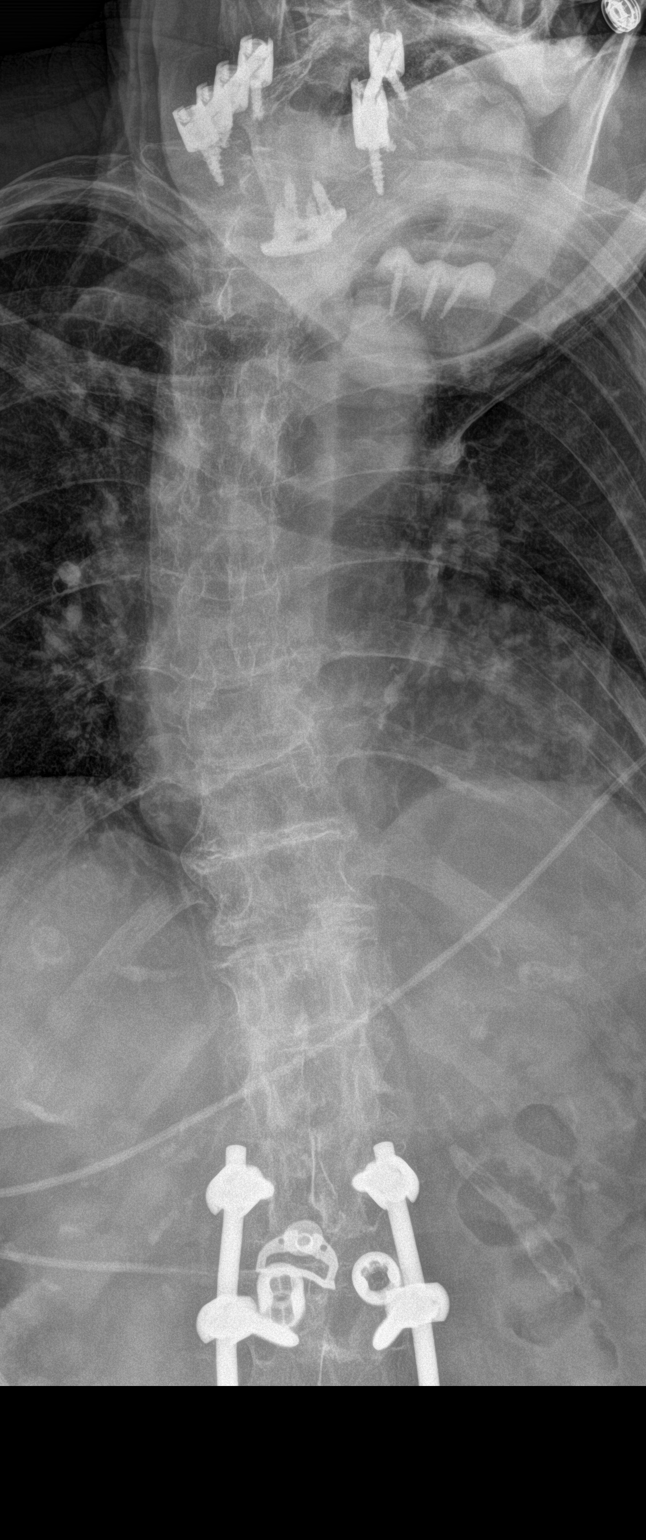

[1 of 1 positions shown; findings below may reference images not displayed]

FINDINGS: Single frontal view of the thoracic spine and single frontal view of
the lumbar spine.

Postsurgical changes are again seen of bilateral transpedicular rod
and screw fusion hardware at L2 through S1 with associated L2-3
through L5-S1 intervertebral disc spacers.

Mild dextrocurvature of the mid to lower thoracic spine. Moderate
multilevel degenerative disc and endplate changes. Partial
visualization of cervical spine posterior rod and screw fusion
hardware and lower cervical spine ACDF hardware. Apparent plate and
screws overlying the lower mandible.

A spinal cord stimulator generator pack is seen overlying the left
buttock and there is a single lead extending into the left S3
foramen, as on recent CT.

There is prior CT IV contrast excretion into the urinary bladder.

Moderate bilateral inferior sacroiliac joint space narrowing.

Old healed left superior and inferior pubic ramus fractures.

Patchy sclerosis and lucency again within the bilateral femoral
heads likely avascular necrosis.
IMPRESSION: Left buttock generator pack and single lead nerve stimulator
overlying the left hemisacrum as on recent CT that demonstrated the
lead tip within the left S3 foramen.

## 2024-05-16 IMAGING — RF DG ERCP WO/W SPHINCTEROTOMY
1 series · 2 of 2 positions shown · non-contrast
Comparison: None Available.

CLINICAL DATA: ERCP with stent placement

EXAM:
ERCP
TECHNIQUE: Multiple spot images obtained with the fluoroscopic device and
submitted for interpretation post-procedure.
FLUOROSCOPY:
Refer to separate report

[Series 1: run · 2 of 2 slices shown]
[im 1/2]
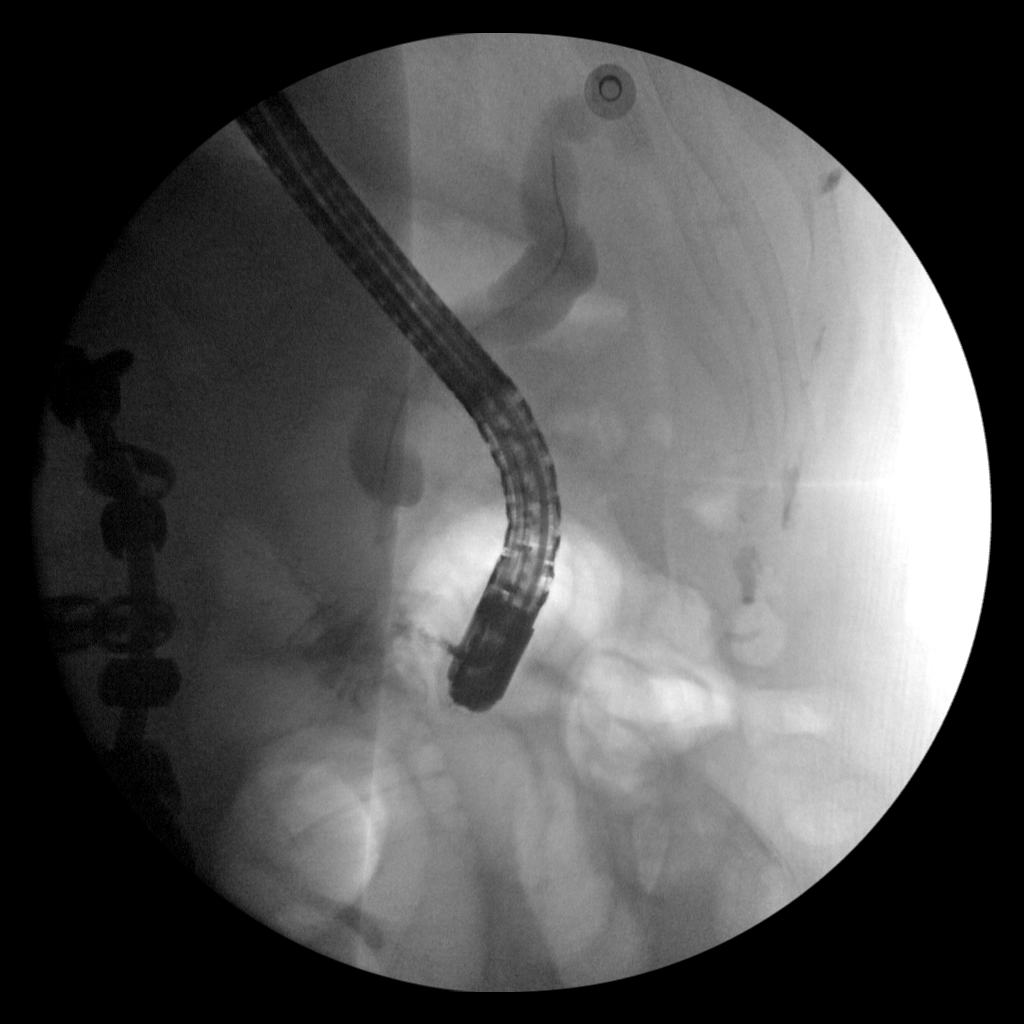
[im 2/2]
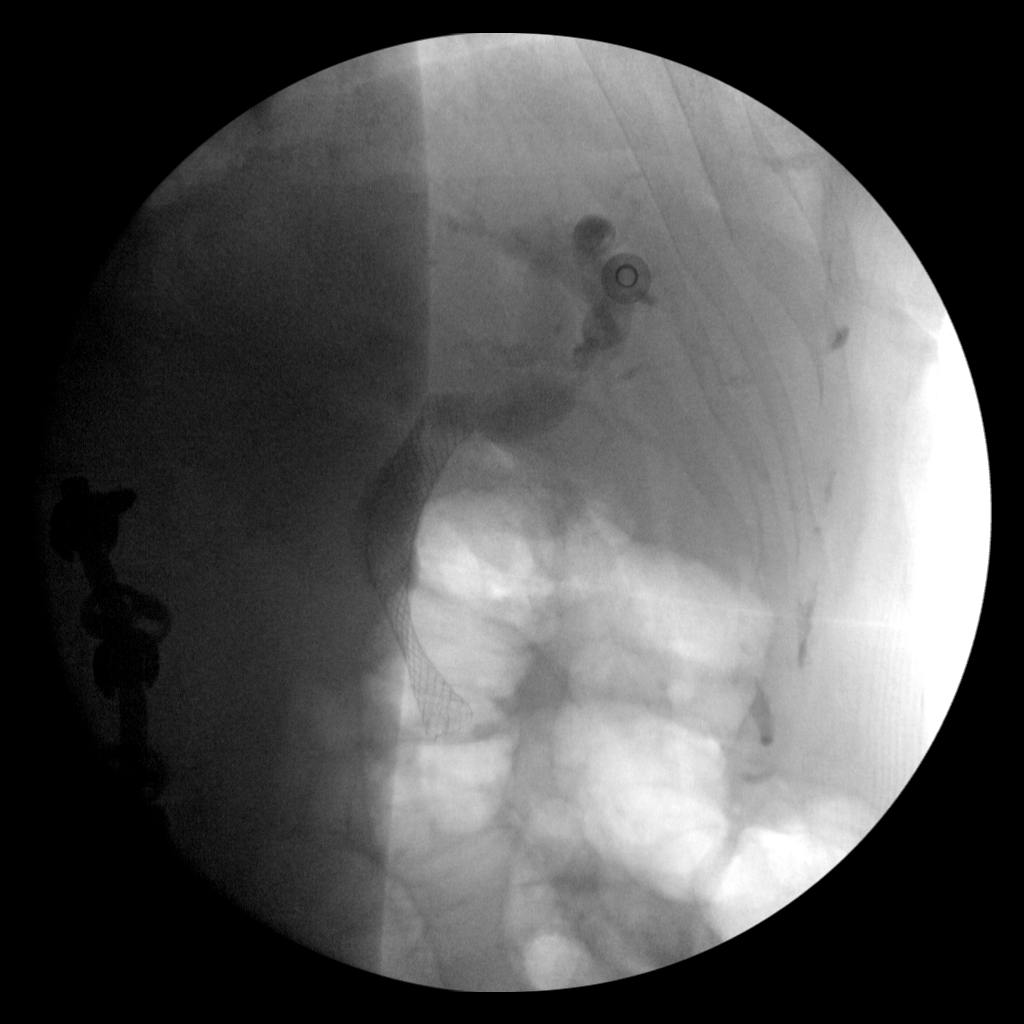

[2 of 2 positions shown; findings below may reference images not displayed]

FINDINGS: A total of 2 fluoroscopic spot images taken during ERCP are
submitted for review. Initial image demonstrates a scope overlying
the upper abdomen with catheterization of the common bile duct.
Contrast injection demonstrates what appears to be a dilated common
bile duct. There is an abrupt cutoff of the distal CBD. Second image
demonstrates a metal biliary stent within the common bile duct.
IMPRESSION: ERCP images as described. Refer to procedure report for full
details.

These images were submitted for radiologic interpretation only.
Please see the procedural report for the amount of contrast and the
fluoroscopy time utilized.
